# Patient Record
Sex: Female | Born: 1965
Health system: Southern US, Community
[De-identification: ages and names within clinical notes are randomized; demographics above are authoritative.]

## PROBLEM LIST (undated history)

## (undated) DIAGNOSIS — D509 Iron deficiency anemia, unspecified: Secondary | ICD-10-CM

## (undated) DIAGNOSIS — R87619 Unspecified abnormal cytological findings in specimens from cervix uteri: Secondary | ICD-10-CM

## (undated) DIAGNOSIS — N891 Moderate vaginal dysplasia: Secondary | ICD-10-CM

## (undated) DIAGNOSIS — N870 Mild cervical dysplasia: Secondary | ICD-10-CM

## (undated) DIAGNOSIS — K922 Gastrointestinal hemorrhage, unspecified: Secondary | ICD-10-CM

## (undated) DIAGNOSIS — K219 Gastro-esophageal reflux disease without esophagitis: Secondary | ICD-10-CM

## (undated) DIAGNOSIS — E785 Hyperlipidemia, unspecified: Secondary | ICD-10-CM

## (undated) DIAGNOSIS — D649 Anemia, unspecified: Secondary | ICD-10-CM

## (undated) DIAGNOSIS — F32A Depression, unspecified: Secondary | ICD-10-CM

## (undated) DIAGNOSIS — E119 Type 2 diabetes mellitus without complications: Secondary | ICD-10-CM

## (undated) DIAGNOSIS — L309 Dermatitis, unspecified: Secondary | ICD-10-CM

## (undated) DIAGNOSIS — L509 Urticaria, unspecified: Secondary | ICD-10-CM

## (undated) DIAGNOSIS — Z973 Presence of spectacles and contact lenses: Secondary | ICD-10-CM

## (undated) DIAGNOSIS — F329 Major depressive disorder, single episode, unspecified: Secondary | ICD-10-CM

## (undated) DIAGNOSIS — I10 Essential (primary) hypertension: Secondary | ICD-10-CM

## (undated) HISTORY — DX: Depression, unspecified: F32.A

## (undated) HISTORY — PX: COLONOSCOPY WITH ESOPHAGOGASTRODUODENOSCOPY (EGD): SHX5779

## (undated) HISTORY — DX: Essential (primary) hypertension: I10

## (undated) HISTORY — DX: Gastrointestinal hemorrhage, unspecified: K92.2

## (undated) HISTORY — DX: Hyperlipidemia, unspecified: E78.5

## (undated) HISTORY — DX: Dermatitis, unspecified: L30.9

## (undated) HISTORY — DX: Urticaria, unspecified: L50.9

## (undated) HISTORY — DX: Anemia, unspecified: D64.9

## (undated) HISTORY — DX: Major depressive disorder, single episode, unspecified: F32.9

## (undated) HISTORY — DX: Gastro-esophageal reflux disease without esophagitis: K21.9

## (undated) HISTORY — DX: Unspecified abnormal cytological findings in specimens from cervix uteri: R87.619

---

## 2004-02-02 DIAGNOSIS — Z87410 Personal history of cervical dysplasia: Secondary | ICD-10-CM

## 2004-02-02 HISTORY — PX: LEEP: SHX91

## 2004-02-02 HISTORY — PX: COLPOSCOPY: SHX161

## 2004-02-02 HISTORY — DX: Personal history of cervical dysplasia: Z87.410

## 2013-07-18 ENCOUNTER — Encounter: Payer: Self-pay | Admitting: *Deleted

## 2013-08-02 ENCOUNTER — Encounter: Payer: Self-pay | Admitting: Family Medicine

## 2013-08-02 ENCOUNTER — Ambulatory Visit (INDEPENDENT_AMBULATORY_CARE_PROVIDER_SITE_OTHER): Payer: 59 | Admitting: Family Medicine

## 2013-08-02 VITALS — BP 120/78 | HR 78 | Temp 98.9°F | Ht 62.25 in | Wt 141.5 lb

## 2013-08-02 DIAGNOSIS — F32A Depression, unspecified: Secondary | ICD-10-CM

## 2013-08-02 DIAGNOSIS — E785 Hyperlipidemia, unspecified: Secondary | ICD-10-CM

## 2013-08-02 DIAGNOSIS — F341 Dysthymic disorder: Secondary | ICD-10-CM

## 2013-08-02 DIAGNOSIS — Z7689 Persons encountering health services in other specified circumstances: Secondary | ICD-10-CM

## 2013-08-02 DIAGNOSIS — I10 Essential (primary) hypertension: Secondary | ICD-10-CM

## 2013-08-02 DIAGNOSIS — F419 Anxiety disorder, unspecified: Secondary | ICD-10-CM

## 2013-08-02 DIAGNOSIS — F329 Major depressive disorder, single episode, unspecified: Secondary | ICD-10-CM

## 2013-08-02 DIAGNOSIS — K219 Gastro-esophageal reflux disease without esophagitis: Secondary | ICD-10-CM

## 2013-08-02 MED ORDER — FLUOXETINE HCL 20 MG PO TABS: 20.0000 mg | ORAL_TABLET | Freq: Every day | ORAL | Status: DC

## 2013-08-02 NOTE — Progress Notes (Signed)
Pre visit review using our clinic review tool, if applicable. No additional management support is needed unless otherwise documented below in the visit note. 

## 2013-08-02 NOTE — Patient Instructions (Addendum)
-  check on last tdap and let us know at your physical  -we can discuss colon cancer screening further at your physical  Can try a taper off of the prozac: -try 54m daily for a few months, or can alternate 220mwith 40 mg if needed  We recommend the following healthy lifestyle measures: - eat a healthy diet consisting of lots of vegetables, fruits, beans, nuts, seeds, healthy meats such as white chicken and fish and whole grains.  - avoid fried foods, fast food, processed foods, sodas, red meet and other fattening foods.  - get a least 150 minutes of aerobic exercise per week.   Follow up as needed for the taper, and 4-6 months for follow up and labs

## 2013-08-02 NOTE — Progress Notes (Signed)
No chief complaint on file.   HPI:  Rachael Hodge is here to establish care. Moved to [place] about 2 months ago. Last PCP and physical: May 2015 - with pap and normal, [facility] nov 2014  Has the following chronic problems and concerns today:  There are no active problems to display for this patient.  HTN/HLD: -on norvasc -CV exercise 2-3 days per week, trying to eat healthy -Denies: CP, SOB, palpitations, swelling -had swelling in legs when took lisinopril -on asa daily -PGF died of MI at age 54, father had MI in early 36s -mild dyslipidemia, reviewed labs done 09/2012  Depression and GAD: -? Bipolar disorder -on prozac and doing great for a long time and whenever she tries to stop she does not do well, has not tried to come off in a long time -used to see a counselor, but doing great now -is interested in decreasing dose -denies SI, thoughts of self harm, no hx of hospitalization  GERD: -doing great on PPI -reports gets bad heartburn if stops PPI -denies: hx of stricture, esophagitis, swallowing issues  ROS: See pertinent positives and negatives per HPI.  Past Medical History  Diagnosis Date  . Depression   . GERD (gastroesophageal reflux disease)   . Hypertension     Family History  Problem Relation Age of Onset  . Heart disease Father   . Hyperlipidemia Father   . Hypertension Father   . Heart disease Paternal Grandfather     History   Social History  . Marital Status: Divorced    Spouse Name: N/A    Number of Children: N/A  . Years of Education: N/A   Social History Main Topics  . Smoking status: Never Smoker   . Smokeless tobacco: None  . Alcohol Use: Yes     Comment: occ 1 drink  . Drug Use: None  . Sexual Activity: None   Other Topics Concern  . 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      Spiritual Beliefs: Christian      Lifestyle:  exercising, trying to eat well             Current outpatient prescriptions:amLODipine (NORVASC) 5 MG tablet, Take 5 mg by mouth daily., Disp: , Rfl: ;  aspirin 81 MG tablet, Take 81 mg by mouth daily., Disp: , Rfl: ;  Fish Oil OIL, daily., Disp: , Rfl: ;  Multiple Vitamin (MULTIVITAMIN) capsule, Take 1 capsule by mouth daily., Disp: , Rfl: ;  pantoprazole (PROTONIX) 40 MG tablet, Take 40 mg by mouth daily., Disp: , Rfl:  FLUoxetine (PROZAC) 20 MG tablet, Take 1 tablet (20 mg total) by mouth daily., Disp: 90 tablet, Rfl: 3  EXAM:  Filed Vitals:   08/02/13 0933  BP: 120/78  Pulse: 78  Temp: 98.9 F (37.2 C)    Body mass index is 25.68 kg/(m^2).  GENERAL: vitals reviewed and listed above, alert, oriented, appears well hydrated and in no acute distress  HEENT: atraumatic, conjunttiva clear, no obvious abnormalities on inspection of external nose and ears  NECK: no obvious masses on inspection  LUNGS: clear to auscultation bilaterally, no wheezes, rales or rhonchi, good air movement  CV: HRRR, no peripheral edema  MS: moves all extremities without noticeable abnormality  PSYCH: pleasant and cooperative, no obvious depression or anxiety  ASSESSMENT AND PLAN:  Discussed the following assessment and plan:  Encounter  to establish care  Anxiety and depression - Plan: FLUoxetine (PROZAC) 20 MG tablet  Essential hypertension, benign  Hyperlipemia  Gastroesophageal reflux disease, esophagitis presence not specified  -We reviewed the PMH, PSH, FH, SH, Meds and Allergies. -We provided refills for any medications we will prescribe as needed. -We addressed current concerns per orders and patient instructions. -We have asked for records for pertinent exams, studies, vaccines and notes from previous providers. -We have advised patient to follow up per instructions below.   -Patient advised to return or notify a doctor immediately if symptoms worsen or persist or new concerns  arise.  Patient Instructions  -check on last tdap and let us know at your physical  -we can discuss colon cancer screening further at your physical  Can try a taper off of the prozac: -try 20mg  daily for a few months, or can alternate 20mg  with 40 mg if needed  We recommend the following healthy lifestyle measures: - eat a healthy diet consisting of lots of vegetables, fruits, beans, nuts, seeds, healthy meats such as white chicken and fish and whole grains.  - avoid fried foods, fast food, processed foods, sodas, red meet and other fattening foods.  - get a least 150 minutes of aerobic exercise per week.   Follow up as needed for the taper, and 4-6 months for follow up and labs       Mileigh Tilley, [place]

## 2013-08-07 ENCOUNTER — Other Ambulatory Visit: Payer: Self-pay

## 2013-08-07 DIAGNOSIS — Z1231 Encounter for screening mammogram for malignant neoplasm of breast: Secondary | ICD-10-CM

## 2013-08-16 ENCOUNTER — Ambulatory Visit
Admission: RE | Admit: 2013-08-16 | Discharge: 2013-08-16 | Disposition: A | Discharge status: Home / Self Care | Payer: 59 | Source: Ambulatory Visit

## 2013-08-16 DIAGNOSIS — Z1231 Encounter for screening mammogram for malignant neoplasm of breast: Secondary | ICD-10-CM

## 2013-08-28 ENCOUNTER — Telehealth: Payer: Self-pay | Admitting: Family Medicine

## 2013-08-28 ENCOUNTER — Other Ambulatory Visit: Payer: Self-pay | Admitting: Family Medicine

## 2013-08-28 MED ORDER — AMLODIPINE BESYLATE 5 MG PO TABS: 5.0000 mg | ORAL_TABLET | Freq: Every day | ORAL | Status: DC

## 2013-08-28 MED ORDER — PANTOPRAZOLE SODIUM 40 MG PO TBEC: 40.0000 mg | DELAYED_RELEASE_TABLET | Freq: Every day | ORAL | Status: DC

## 2013-08-28 NOTE — Progress Notes (Signed)
Sent!

## 2013-08-28 NOTE — Telephone Encounter (Signed)
Pt needs new rxs sent to [facility] outpt pharm amlodipine 5 mg #90 and pantoprazole 40 mg #90 w/refills

## 2013-10-11 ENCOUNTER — Ambulatory Visit (INDEPENDENT_AMBULATORY_CARE_PROVIDER_SITE_OTHER): Payer: 59 | Admitting: Family Medicine

## 2013-10-11 ENCOUNTER — Encounter: Payer: Self-pay | Admitting: Family Medicine

## 2013-10-11 VITALS — BP 100/64 | HR 72 | Temp 98.7°F | Ht 62.25 in | Wt 138.0 lb

## 2013-10-11 DIAGNOSIS — L259 Unspecified contact dermatitis, unspecified cause: Secondary | ICD-10-CM

## 2013-10-11 DIAGNOSIS — L309 Dermatitis, unspecified: Secondary | ICD-10-CM

## 2013-10-11 NOTE — Progress Notes (Signed)
Pre visit review using our clinic review tool, if applicable. No additional management support is needed unless otherwise documented below in the visit note. 

## 2013-10-11 NOTE — Patient Instructions (Signed)
-  lamisil (terbinafine)  twice daily  -follow up in 4 weeks

## 2013-10-11 NOTE — Progress Notes (Signed)
No chief complaint on file.   HPI:  Acute visit for:  1) Rash: -on R arm and leg -itchy, for last week, lesions R arm and R leg -denies fevers, malaise, pain  ROS: See pertinent positives and negatives per HPI.  Past Medical History  Diagnosis Date  . Depression   . GERD (gastroesophageal reflux disease)   . Hypertension     No past surgical history on file.  Family History  Problem Relation Age of Onset  . Heart disease Father   . Hyperlipidemia Father   . Hypertension Father   . Heart disease Paternal Grandfather     History   Social History  . Marital Status: Divorced    Spouse Name: N/A    Number of Children: N/A  . Years of Education: N/A   Social History Main Topics  . Smoking status: Never Smoker   . Smokeless tobacco: None  . Alcohol Use: Yes     Comment: occ 1 drink  . Drug Use: None  . Sexual Activity: None   Other Topics Concern  . 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      Spiritual Beliefs: Christian      Lifestyle: exercising, trying to eat well             Current outpatient prescriptions:amLODipine (NORVASC) 5 MG tablet, Take 1 tablet (5 mg total) by mouth daily., Disp: 90 tablet, Rfl: 3;  aspirin 81 MG tablet, Take 81 mg by mouth daily., Disp: , Rfl: ;  Fish Oil OIL, daily., Disp: , Rfl: ;  FLUoxetine (PROZAC) 20 MG tablet, Take 1 tablet (20 mg total) by mouth daily., Disp: 90 tablet, Rfl: 3;  Multiple Vitamin (MULTIVITAMIN) capsule, Take 1 capsule by mouth daily., Disp: , Rfl:  pantoprazole (PROTONIX) 40 MG tablet, Take 1 tablet (40 mg total) by mouth daily., Disp: 90 tablet, Rfl: 3  EXAM:  Filed Vitals:   10/11/13 0809  BP: 100/64  Pulse: 72  Temp: 98.7 F (37.1 C)    Body mass index is 25.04 kg/(m^2).  GENERAL: vitals reviewed and listed above, alert, oriented, appears well hydrated and in no acute distress  HEENT:  atraumatic, conjunttiva clear, no obvious abnormalities on inspection of external nose and ears  NECK: no obvious masses on inspection  LUNGS: clear to auscultation bilaterally, no wheezes, rales or rhonchi, good air movement  CV: HRRR, no peripheral edema  SKIN: anular lesion on the R arm and one on the R leg with erythematous scaly border with central clearing  MS: moves all extremities without noticeable abnormality  PSYCH: pleasant and cooperative, no obvious depression or anxiety  ASSESSMENT AND PLAN:  Discussed the following assessment and plan:  Dermatitis  -most likely tinea corporis or granuloma anulare - discussed options for dx/management -she opted for trial of topical antifungal and follow up in 4 weeks -Patient advised to return or notify a doctor immediately if symptoms worsen or persist or new concerns arise.  Patient Instructions  -lamisil (terbinafine)  twice daily  -follow up in 4 weeks     Nare Gaspari R.

## 2013-10-22 ENCOUNTER — Telehealth: Payer: Self-pay | Admitting: Family Medicine

## 2013-10-22 DIAGNOSIS — F32A Depression, unspecified: Secondary | ICD-10-CM

## 2013-10-22 DIAGNOSIS — F329 Major depressive disorder, single episode, unspecified: Secondary | ICD-10-CM

## 2013-10-22 DIAGNOSIS — F419 Anxiety disorder, unspecified: Principal | ICD-10-CM

## 2013-10-22 MED ORDER — FLUOXETINE HCL 20 MG PO TABS: 40.0000 mg | ORAL_TABLET | Freq: Every day | ORAL | Status: DC

## 2013-10-22 MED ORDER — FLUOXETINE HCL 40 MG PO CAPS: ORAL_CAPSULE | ORAL | Status: DC

## 2013-10-22 NOTE — Telephone Encounter (Signed)
I am not sure I understand this message. I think she was on 20 mg and stable? If refill sent incorrectly please correct. If something has changed and needs change in medication advise office visit.

## 2013-10-22 NOTE — Telephone Encounter (Signed)
[place] rx for 40 mg daily (2 tabs daily)

## 2013-10-22 NOTE — Telephone Encounter (Signed)
I called the pt and she stated she forgot to mention this to Dr Maudie Mercury when she was here last week but she only used the mg for a little while and felt "crummy" so she started taking the old Rx she had for 23m and she feels better.  Message forwarded to Dr KMaudie Mercury

## 2013-10-22 NOTE — Telephone Encounter (Signed)
Pt states her previous dosage ofFLUoxetine (PROZAC) 20 MG tablet was working better for her and would to go back to 5m please.

## 2013-10-22 NOTE — Telephone Encounter (Signed)
Rx done and the pt is aware the Rx was sent to her pharmacy.

## 2013-10-24 ENCOUNTER — Encounter: Payer: Self-pay | Admitting: Dietician

## 2013-10-24 ENCOUNTER — Encounter: Payer: 59 | Attending: Family Medicine | Admitting: Dietician

## 2013-10-24 VITALS — Ht 62.75 in | Wt 141.0 lb

## 2013-10-24 DIAGNOSIS — Z713 Dietary counseling and surveillance: Secondary | ICD-10-CM | POA: Diagnosis not present

## 2013-10-24 DIAGNOSIS — R635 Abnormal weight gain: Secondary | ICD-10-CM | POA: Insufficient documentation

## 2013-10-24 NOTE — Progress Notes (Signed)
Medical Nutrition Therapy:  Appt start time: 0730 end time:  0830.  Assessment:  Primary concerns today: Rachael Hodge is here today since she's had a lot changes in the past year and is gaining weight. She is going through menopause since last November. Has gained weight around the middle (about 15-20 in the past year). Has not had a weight problem before in her life. Has been doing her elliptical machine for the last few weeks and planning to start biking soon. Has followed [facility] [place] before and it is not working now. Thinks that she needs more accountability and she is craving things she used to not crave like sugar.   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 Moved from [place] in April. Works about 40 hours per week. Lives with her boyfriend. States that they both do the food shopping and her boyfriend cooks. During the week they don't cook but will "grab"   food. Skips about 5 meals per week (usually dinner) since she snacks "a lot". Has about 1 restaurant meal per week though eats cafeteria each day.   Weight loss goal is 130 lbs. Weighed 125 lbs most of adult life.  Preferred Learning Style:   No preference indicated   Learning Readiness:   Ready  MEDICATIONS: see list   DIETARY INTAKE:  Usual eating pattern includes 2-3 meals and 2 snacks per day.  Everyday foods include cheese  Avoided foods include: fish, beef, a lot of meats, spicy food, a lot of vegetables (asparagus, pepper, onion)  24-hr recall:  B ( AM): yogurt, eggs, or oatmeal with coffee with half and half and truvia Snk ( AM):cheese stick or protein bar (maybe Premier)  L ( PM): cafeteria salad - sometimes taco salad, vegetables with light salad dressing and chicken Snk ( PM): cheese, cookies, crackers D ( PM): doesn't prep - could be cheese and crackers, yogurt, leftovers (burritos, hamburgers on weekends) Snk ( PM): not usually Beverages: coffee and water, drinks a glass of  wine on Friday or Saturday  Usual physical activity: 3 x week for about 36 minutes (just started a few weeks ago), 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   Nutritional Diagnosis:  [place]-3.4 Unintentional weight gain As related to menopause.  As evidenced by 15-20 lbs weight gain in past year.    Intervention:  Nutrition counseling provided. Plan: Think about adding resistance training to your workout (trainer or classes) to help build muscle. Try not to use electronic devices in the hour before you go to bed. For meals and snacks, have protein with carbohydrates (cheese, yogurt, eggs, nuts, peanut butter) and portion out snacks ahead of time. [place] Protein bars for snacks. Eat a snack in the afternoon so you are not so hungry after work. Have prepared fruit and vegetables (prewashed and cut up) or frozen vegetables that you can microwave.  When you go out to eat, choose one starch to have.  Eat dinner meal at the table, on a plate, with no distractions.  Think about writing down what you eat.   Teaching Method Utilized:  Visual Auditory Hands on  Handouts given during visit include:  MyPlate Handout  15 g CHO snacks  Barriers to learning/adherence to lifestyle change: none  Demonstrated degree of understanding via:  Teach Back   Monitoring/Evaluation:  Dietary  intake, exercise, and body weight in 2 month(s).

## 2013-10-24 NOTE — Patient Instructions (Signed)
Think about adding resistance training to your workout (trainer or classes) to help build muscle. Try not to use electronic devices in the hour before you go to bed. For meals and snacks, have protein with carbohydrates (cheese, yogurt, eggs, nuts, peanut butter) and portion out snacks ahead of time. [place] Protein bars for snacks. Eat a snack in the afternoon so you are not so hungry after work. Have prepared fruit and vegetables (prewashed and cut up) or frozen vegetables that you can microwave.  When you go out to eat, choose one starch to have.  Eat dinner meal at the table, on a plate, with no distractions.  Think about writing down what you eat.

## 2013-12-03 ENCOUNTER — Ambulatory Visit: Payer: 59 | Admitting: Family Medicine

## 2013-12-18 ENCOUNTER — Ambulatory Visit: Payer: 59 | Admitting: Family Medicine

## 2013-12-26 ENCOUNTER — Ambulatory Visit: Payer: 59 | Admitting: Dietician

## 2014-01-05 ENCOUNTER — Encounter: Payer: 59 | Attending: Family Medicine | Admitting: Dietician

## 2014-01-05 DIAGNOSIS — Z713 Dietary counseling and surveillance: Secondary | ICD-10-CM | POA: Diagnosis present

## 2014-01-05 NOTE — Progress Notes (Signed)
Patient was seen on 01/05/2014 for the Weight Loss Class at [facility]. The following learning objectives were met by the patient during this class:   Describe healthy choices in each food group  Describe portion size of foods  Use plate method for meal planning  Demonstrate how to read Nutrition Facts food label  Set realistic goals for weight loss, diet changes, and physical activity.   Goals:  1. Make healthy food choices in each food group.  2. Reduce portion size of foods.  3. Increase fruit and vegetable intake.  4. Use plate method for meal planning.  5. Increase physical activity.    Handouts given:   1. Nutrition Strategies for Weight Loss   2. Meal plan/portion card   3. MyPlate Planner   4. Weight Management Recipe Resources   5. Bake, Broil, Grill   

## 2014-01-14 ENCOUNTER — Ambulatory Visit (INDEPENDENT_AMBULATORY_CARE_PROVIDER_SITE_OTHER): Payer: 59 | Admitting: Family Medicine

## 2014-01-14 ENCOUNTER — Encounter: Payer: Self-pay | Admitting: Family Medicine

## 2014-01-14 VITALS — BP 122/82 | HR 85 | Temp 98.4°F | Ht 62.75 in | Wt 148.8 lb

## 2014-01-14 DIAGNOSIS — F32A Depression, unspecified: Secondary | ICD-10-CM

## 2014-01-14 DIAGNOSIS — K219 Gastro-esophageal reflux disease without esophagitis: Secondary | ICD-10-CM

## 2014-01-14 DIAGNOSIS — F329 Major depressive disorder, single episode, unspecified: Secondary | ICD-10-CM

## 2014-01-14 DIAGNOSIS — Z1211 Encounter for screening for malignant neoplasm of colon: Secondary | ICD-10-CM

## 2014-01-14 DIAGNOSIS — I10 Essential (primary) hypertension: Secondary | ICD-10-CM

## 2014-01-14 LAB — BASIC METABOLIC PANEL
BUN: 15 mg/dL (ref 6–23)
CALCIUM: 9.4 mg/dL (ref 8.4–10.5)
CO2: 26 mEq/L (ref 19–32)
CREATININE: 0.9 mg/dL (ref 0.4–1.2)
Chloride: 105 mEq/L (ref 96–112)
GFR: 73.95 mL/min (ref >60.00)
Glucose, Bld: 87 mg/dL (ref 70–99)
Potassium: 4.3 mEq/L (ref 3.5–5.1)
Sodium: 138 mEq/L (ref 135–145)

## 2014-01-14 LAB — LIPID PANEL
CHOLESTEROL: 262 mg/dL — AB (ref 0–200)
HDL: 87.5 mg/dL (ref >39.00)
LDL Cholesterol: 160 mg/dL — ABNORMAL HIGH (ref 0–99)
NonHDL: 174.5
TRIGLYCERIDES: 73 mg/dL (ref 0.0–149.0)
Total CHOL/HDL Ratio: 3
VLDL: 14.6 mg/dL (ref 0.0–40.0)

## 2014-01-14 LAB — HEMOGLOBIN A1C: HEMOGLOBIN A1C: 6.5 % (ref 4.6–6.5)

## 2014-01-14 NOTE — Progress Notes (Signed)
HPI:  Follow up:  Depression: -on prozax 40mg  daily -reports: doing much better, feels great Depression Symptoms: Sleep disorder: no Interest deficit/anhedonia: no Guilt (worthlessness, hopelessness, regret): no Energy deficit: no Concentration deficit: no Appetite disorder: no Psychomotor retardation or agitation: no Suicidality: no  GERD: -meds: protonix 40mg  -denies hx of stricture or hiatal hernia to her knowledge -symptoms return when stops PPI -denies: dysphagia, worsening reflux, nausea, weight loss  HTN: -meds: amlodipine -denies: CP, SOB, DOE, swelling -working on exercise, got a fit bit  HM: -colon ca screening - declined colonoscopy now, opted for FOBT cards -flu vaccine - done  ROS: See pertinent positives and negatives per HPI.  Past Medical History  Diagnosis Date  . Depression   . GERD (gastroesophageal reflux disease)   . Hypertension   . Hyperlipidemia     No past surgical history on file.  Family History  Problem Relation Age of Onset  . Heart disease Father   . Hyperlipidemia Father   . Hypertension Father   . Heart disease Paternal Grandfather     History   Social History  . Marital Status: Divorced    Spouse Name: N/A    Number of Children: N/A  . Years of Education: N/A   Social History Main Topics  . Smoking status: Never Smoker   . Smokeless tobacco: None  . Alcohol Use: Yes     Comment: occ 1 drink  . Drug Use: None  . Sexual Activity: None   Other Topics Concern  . 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      Spiritual Beliefs: Christian      Lifestyle: exercising, trying to eat well             Current outpatient prescriptions: amLODipine (NORVASC) 5 MG tablet, Take 1 tablet (5 mg total) by mouth daily., Disp: 90 tablet, Rfl: 3;  aspirin 81 MG tablet, Take 81 mg by mouth daily., Disp: , Rfl: ;  Fish Oil OIL,  daily., Disp: , Rfl: ;  FLUoxetine (PROZAC) 40 MG capsule, Take 1 by mouth daily, Disp: 30 capsule, Rfl: 0;  Multiple Vitamin (MULTIVITAMIN) capsule, Take 1 capsule by mouth daily., Disp: , Rfl:  pantoprazole (PROTONIX) 40 MG tablet, Take 1 tablet (40 mg total) by mouth daily., Disp: 90 tablet, Rfl: 3  EXAM:  Filed Vitals:   01/14/14 0859  BP: 122/82  Pulse: 85  Temp: 98.4 F (36.9 C)    Body mass index is 26.56 kg/(m^2).  GENERAL: vitals reviewed and listed above, alert, oriented, appears well hydrated and in no acute distress  HEENT: atraumatic, conjunttiva clear, no obvious abnormalities on inspection of external nose and ears  NECK: no obvious masses on inspection  LUNGS: clear to auscultation bilaterally, no wheezes, rales or rhonchi, good air movement  CV: HRRR, no peripheral edema  MS: moves all extremities without noticeable abnormality  PSYCH: pleasant and cooperative, no obvious depression or anxiety  ASSESSMENT AND PLAN:  Discussed the following assessment and plan:  Essential hypertension - Plan: Basic metabolic panel, Hemoglobin A1c, Lipid Panel  Gastroesophageal reflux disease, esophagitis presence not specified  Depression  Colon cancer screening  -advised of colon cancer screening and flu vaccine preventive care - she wants to do stool cards, did flu vaccine -advised healthy lifestyle for management of HTN and GERD -advised counseling and regular exercise for emotional health -follow up 4  months  -Patient advised to return or notify a doctor immediately if symptoms worsen or persist or new concerns arise.  Patient Instructions  BEFORE YOU LEAVE: -labs -schedule follow up in 4 months  -We have ordered labs or studies at this visit. It can take up to 1-2 weeks for results and processing. We will contact you with instructions IF your results are abnormal. Normal results will be released to your [facility]. If you have not heard from [place] or can not find  your results in [facility] in 2 weeks please contact our office.  -PLEASE SIGN UP FOR MYCHART TODAY   We recommend the following healthy lifestyle measures: - eat a healthy diet consisting of lots of vegetables, fruits, beans, nuts, seeds, healthy meats such as white chicken and fish and whole grains.  - avoid fried foods, fast food, processed foods, sodas, red meet and other fattening foods.  - get a least 150 minutes of aerobic exercise per week.         Colin Benton R.

## 2014-01-14 NOTE — Patient Instructions (Signed)
BEFORE YOU LEAVE: -labs -schedule follow up in 4 months  -We have ordered labs or studies at this visit. It can take up to 1-2 weeks for results and processing. We will contact you with instructions IF your results are abnormal. Normal results will be released to your [facility]. If you have not heard from [place] or can not find your results in [facility] in 2 weeks please contact our office.  -PLEASE SIGN UP FOR MYCHART TODAY   We recommend the following healthy lifestyle measures: - eat a healthy diet consisting of lots of vegetables, fruits, beans, nuts, seeds, healthy meats such as white chicken and fish and whole grains.  - avoid fried foods, fast food, processed foods, sodas, red meet and other fattening foods.  - get a least 150 minutes of aerobic exercise per week.

## 2014-01-14 NOTE — Progress Notes (Signed)
Pre visit review using our clinic review tool, if applicable. No additional management support is needed unless otherwise documented below in the visit note. 

## 2014-01-15 ENCOUNTER — Telehealth: Payer: Self-pay | Admitting: Family Medicine

## 2014-01-15 NOTE — Telephone Encounter (Signed)
emmi emailed °

## 2014-02-19 ENCOUNTER — Other Ambulatory Visit: Payer: Self-pay | Admitting: Family Medicine

## 2014-02-19 MED ORDER — FLUOXETINE HCL 40 MG PO CAPS: ORAL_CAPSULE | ORAL | Status: DC

## 2014-02-19 NOTE — Telephone Encounter (Signed)
Rx done. 

## 2014-04-18 ENCOUNTER — Ambulatory Visit: Payer: 59 | Admitting: Family Medicine

## 2014-04-20 ENCOUNTER — Encounter (HOSPITAL_COMMUNITY): Payer: Self-pay | Admitting: *Deleted

## 2014-04-20 ENCOUNTER — Emergency Department (HOSPITAL_COMMUNITY)
Admission: EM | Admit: 2014-04-20 | Discharge: 2014-04-20 | Disposition: A | Discharge status: Home / Self Care | Payer: 59 | Source: Home / Self Care | Attending: Emergency Medicine | Admitting: Emergency Medicine

## 2014-04-20 DIAGNOSIS — J069 Acute upper respiratory infection, unspecified: Secondary | ICD-10-CM

## 2014-04-20 MED ORDER — IPRATROPIUM BROMIDE 0.06 % NA SOLN: 2.0000 | Freq: Four times a day (QID) | NASAL | Status: DC

## 2014-04-20 MED ORDER — BENZONATATE 100 MG PO CAPS: 100.0000 mg | ORAL_CAPSULE | Freq: Three times a day (TID) | ORAL | Status: DC | PRN

## 2014-04-20 NOTE — Discharge Instructions (Signed)
Expect improvement over the next 5-7 days. If not, please follow up with your doctor Upper Respiratory Infection, Adult An upper respiratory infection (URI) is also sometimes known as the common cold. The upper respiratory tract includes the nose, sinuses, throat, trachea, and bronchi. Bronchi are the airways leading to the lungs. Most people improve within 1 week, but symptoms can last up to 2 weeks. A residual cough may last even longer.  CAUSES Many different viruses can infect the tissues lining the upper respiratory tract. The tissues become irritated and inflamed and often become very moist. Mucus production is also common. A cold is contagious. You can easily spread the virus to others by oral contact. This includes kissing, sharing a glass, coughing, or sneezing. Touching your mouth or nose and then touching a surface, which is then touched by another person, can also spread the virus. SYMPTOMS  Symptoms typically develop 1 to 3 days after you come in contact with a cold virus. Symptoms vary from person to person. They may include:  Runny nose.  Sneezing.  Nasal congestion.  Sinus irritation.  Sore throat.  Loss of voice (laryngitis).  Cough.  Fatigue.  Muscle aches.  Loss of appetite.  Headache.  Low-grade fever. DIAGNOSIS  You might diagnose your own cold based on familiar symptoms, since most people get a cold 2 to 3 times a year. Your caregiver can confirm this based on your exam. Most importantly, your caregiver can check that your symptoms are not due to another disease such as strep throat, sinusitis, pneumonia, asthma, or epiglottitis. Blood tests, throat tests, and X-rays are not necessary to diagnose a common cold, but they may sometimes be helpful in excluding other more serious diseases. Your caregiver will decide if any further tests are required. RISKS AND COMPLICATIONS  You may be at risk for a more severe case of the common cold if you smoke cigarettes, have  chronic heart disease (such as heart failure) or lung disease (such as asthma), or if you have a weakened immune system. The very young and very old are also at risk for more serious infections. Bacterial sinusitis, middle ear infections, and bacterial pneumonia can complicate the common cold. The common cold can worsen asthma and chronic obstructive pulmonary disease (COPD). Sometimes, these complications can require emergency medical care and may be life-threatening. PREVENTION  The best way to protect against getting a cold is to practice good hygiene. Avoid oral or hand contact with people with cold symptoms. Wash your hands often if contact occurs. There is no clear evidence that vitamin C, vitamin E, echinacea, or exercise reduces the chance of developing a cold. However, it is always recommended to get plenty of rest and practice good nutrition. TREATMENT  Treatment is directed at relieving symptoms. There is no cure. Antibiotics are not effective, because the infection is caused by a virus, not by bacteria. Treatment may include:  Increased fluid intake. Sports drinks offer valuable electrolytes, sugars, and fluids.  Breathing heated mist or steam (vaporizer or shower).  Eating chicken soup or other clear broths, and maintaining good nutrition.  Getting plenty of rest.  Using gargles or lozenges for comfort.  Controlling fevers with ibuprofen or acetaminophen as directed by your caregiver.  Increasing usage of your inhaler if you have asthma. Zinc gel and zinc lozenges, taken in the first 24 hours of the common cold, can shorten the duration and lessen the severity of symptoms. Pain medicines may help with fever, muscle aches, and throat pain.  A variety of non-prescription medicines are available to treat congestion and runny nose. Your caregiver can make recommendations and may suggest nasal or lung inhalers for other symptoms.  HOME CARE INSTRUCTIONS   Only take over-the-counter or  prescription medicines for pain, discomfort, or fever as directed by your caregiver.  Use a warm mist humidifier or inhale steam from a shower to increase air moisture. This may keep secretions moist and make it easier to breathe.  Drink enough water and fluids to keep your urine clear or pale yellow.  Rest as needed.  Return to work when your temperature has returned to normal or as your caregiver advises. You may need to stay home longer to avoid infecting others. You can also use a face mask and careful hand washing to prevent spread of the virus. SEEK MEDICAL CARE IF:   After the first few days, you feel you are getting worse rather than better.  You need your caregiver's advice about medicines to control symptoms.  You develop chills, worsening shortness of breath, or brown or red sputum. These may be signs of pneumonia.  You develop yellow or brown nasal discharge or pain in the face, especially when you bend forward. These may be signs of sinusitis.  You develop a fever, swollen neck glands, pain with swallowing, or white areas in the back of your throat. These may be signs of strep throat. SEEK IMMEDIATE MEDICAL CARE IF:   You have a fever.  You develop severe or persistent headache, ear pain, sinus pain, or chest pain.  You develop wheezing, a prolonged cough, cough up blood, or have a change in your usual mucus (if you have chronic lung disease).  You develop sore muscles or a stiff neck. Document Released: 07/14/2000 Document Revised: 04/12/2011 Document Reviewed: 04/25/2013 [facility] Patient Information 2015 [place], [place]. This information is not intended to replace advice given to you by your health care provider. Make sure you discuss any questions you have with your health care provider.

## 2014-04-20 NOTE — ED Notes (Addendum)
C/o cough and congestion onset 3 days ago.  C/o sore throat.  Earache Wed and Demetrius Charity but is gone now.  Cough is prod of green sputum.  Had chills and fever the first couple of days.  States she has been taking Nyquil and Dayquil without relief.  States it has made her BP go up.

## 2014-04-20 NOTE — ED Provider Notes (Signed)
CSN: [id number]     Arrival date & time 04/20/14  1322 History   First MD Initiated Contact with Patient 04/20/14 1431     Chief Complaint  Patient presents with  . Cough   (Consider location/radiation/quality/duration/timing/severity/associated sxs/prior Treatment) Patient is a 49 y.o. female presenting with URI. The history is provided by the patient.  URI Presenting symptoms: congestion, cough and rhinorrhea   Presenting symptoms: no ear pain, no facial pain, no fatigue, no fever and no sore throat   Severity:  Mild Onset quality:  Gradual Duration:  6 days Timing:  Constant Progression:  Improving Chronicity:  New Associated symptoms: no headaches, no neck pain and no wheezing     Past Medical History  Diagnosis Date  . Depression   . GERD (gastroesophageal reflux disease)   . Hypertension   . Hyperlipidemia    History reviewed. No pertinent past surgical history. Family History  Problem Relation Age of Onset  . Heart disease Father   . Hyperlipidemia Father   . Hypertension Father   . Heart disease Paternal Grandfather    History  Substance Use Topics  . Smoking status: Never Smoker   . Smokeless tobacco: Not on file  . Alcohol Use: No   OB History    No data available     Review of Systems  Constitutional: Negative for fever and fatigue.  HENT: Positive for congestion and rhinorrhea. Negative for ear pain and sore throat.   Eyes: Negative.   Respiratory: Positive for cough. Negative for chest tightness, shortness of breath and wheezing.   Cardiovascular: Negative.   Gastrointestinal: Negative.   Musculoskeletal: Negative for back pain, neck pain and neck stiffness.  Skin: Negative.   Neurological: Negative for dizziness, weakness and headaches.    Allergies  Penicillins and Sulfa antibiotics  Home Medications   Prior to Admission medications   Medication Sig Start Date End Date Taking? Authorizing Provider  amLODipine (NORVASC) 5 MG tablet Take 1  tablet (5 mg total) by mouth daily. 08/28/13  Yes Lucretia Kern, DO  aspirin 81 MG tablet Take 81 mg by mouth daily.   Yes Historical Provider, MD  Fish Oil OIL daily.   Yes Historical Provider, MD  FLUoxetine (PROZAC) 40 MG capsule Take 1 by mouth daily 02/19/14  Yes Lucretia Kern, DO  Multiple Vitamin (MULTIVITAMIN) capsule Take 1 capsule by mouth daily.   Yes Historical Provider, MD  pantoprazole (PROTONIX) 40 MG tablet Take 1 tablet (40 mg total) by mouth daily. 08/28/13  Yes Lucretia Kern, DO  benzonatate (TESSALON) 100 MG capsule Take 1 capsule (100 mg total) by mouth 3 (three) times daily as needed for cough. 04/20/14   Audelia Hives Zema Lizardo, PA  ipratropium (ATROVENT) 0.06 % nasal spray Place 2 sprays into both nostrils 4 (four) times daily. For nasal congestion 04/20/14   Annett Gula H Eldred Sooy, PA   BP 158/75 mmHg  Pulse 72  Temp(Src) 97.8 F (36.6 C) (Oral)  SpO2 100%  LMP 12/02/2012 Physical Exam  Constitutional: She is oriented to person, place, and time. She appears well-developed and well-nourished. No distress.  HENT:  Head: Normocephalic and atraumatic.  Right Ear: Hearing, tympanic membrane, external ear and ear canal normal.  Left Ear: Hearing, tympanic membrane, external ear and ear canal normal.  Nose: Nose normal.  Mouth/Throat: Uvula is midline, oropharynx is clear and moist and mucous membranes are normal.  Eyes: Conjunctivae are normal. No scleral icterus.  Neck: Normal range of motion.  Neck supple.  Cardiovascular: Normal rate, regular rhythm and normal heart sounds.   Pulmonary/Chest: Effort normal and breath sounds normal.  Musculoskeletal: Normal range of motion.  Lymphadenopathy:    She has no cervical adenopathy.  Neurological: She is alert and oriented to person, place, and time.  Skin: Skin is warm and dry.  Psychiatric: She has a normal mood and affect. Her behavior is normal.  Nursing note and vitals reviewed.   ED Course  Procedures (including  critical care time) Labs Review Labs Reviewed - No data to display  Imaging Review No results found.   MDM   1. URI (upper respiratory infection)   fluids, rest, tylenol, atrovent nasal spray and tessalon as prescribed with PCP follow up. Advised that cough will likely take and additional 7-10 days to resolve.     Lutricia Feil, [place] 04/20/14 [phone]

## 2014-04-24 ENCOUNTER — Encounter: Payer: Self-pay | Admitting: Family Medicine

## 2014-04-24 ENCOUNTER — Ambulatory Visit (INDEPENDENT_AMBULATORY_CARE_PROVIDER_SITE_OTHER): Payer: 59 | Admitting: Family Medicine

## 2014-04-24 VITALS — BP 140/86 | HR 77 | Temp 98.3°F | Wt 147.0 lb

## 2014-04-24 DIAGNOSIS — J0110 Acute frontal sinusitis, unspecified: Secondary | ICD-10-CM | POA: Diagnosis not present

## 2014-04-24 MED ORDER — PREDNISONE 20 MG PO TABS: ORAL_TABLET | ORAL | Status: DC

## 2014-04-24 NOTE — Patient Instructions (Signed)
Sinusitis (likely bacterial given > 10 days and getting better then getting worse)  Your body can likely still clear this without antibiotics.   We will decrease inflammation with prednisone x 7 days.   If you are not noting improvement within about 4 days, I am willing to call in azithromycin for you (call or mychart message me)

## 2014-04-24 NOTE — Progress Notes (Signed)
  Garret Reddish, MD Phone: [phone]  Subjective:   Rachael Hodge is a 49 y.o. year old very pleasant female patient who presents with the following:  Cough/congestion/sinus pressure -First day of illness 12th of March. Cough with irritation in chest with cough, green sputum and nasal drainage.  Fever in first few days subjective. Took nyquil and dayquil in beginning and did not help and BP went up so did not feel well. Saw Dr. Bridgett Larsson at urgent [facility] to use atrovent and tessalon pearls. Feels best when taking advil. Has stopped dayquil and nyquil. Feels congested. Feels frontal sinus pressure. Called in sick last week and felt better on Friday and then Saturday started to worsen again.   ROS- no nausea/vomiting. No shortness of breath or chest pain.   Past Medical History- no asthma history, nonsmoker, hypertension  Medications- reviewed and updated Current Outpatient Prescriptions  Medication Sig Dispense Refill  . amLODipine (NORVASC) 5 MG tablet Take 1 tablet (5 mg total) by mouth daily. 90 tablet 3  . aspirin 81 MG tablet Take 81 mg by mouth daily.    . Fish Oil OIL daily.    [name] FLUoxetine (PROZAC) 40 MG capsule Take 1 by mouth daily 30 capsule 3  . Multiple Vitamin (MULTIVITAMIN) capsule Take 1 capsule by mouth daily.    . pantoprazole (PROTONIX) 40 MG tablet Take 1 tablet (40 mg total) by mouth daily. 90 tablet 3  . benzonatate (TESSALON) 100 MG capsule Take 1 capsule (100 mg total) by mouth 3 (three) times daily as needed for cough. (Patient not taking: Reported on 04/24/2014) 21 capsule 0  . ipratropium (ATROVENT) 0.06 % nasal spray Place 2 sprays into both nostrils 4 (four) times daily. For nasal congestion (Patient not taking: Reported on 04/24/2014) 15 mL 0   No current facility-administered medications for this visit.    Objective: BP 140/86 mmHg  Pulse 77  Temp(Src) 98.3 F (36.8 C)  Wt 147 lb (66.679 kg)  SpO2 96%  LMP 12/02/2012 Gen: NAD, resting  comfortably HEENT: naral turbinates erythematous and swollen with green drainage, oropharynx normal without pharyngeal exudate, TM normal bilaterally, Mucous membranes are moist. Tender frontal sinuses CV: RRR no murmurs rubs or gallops Lungs: CTAB no crackles, wheeze, rhonchi Abdomen: soft/nontender/nondistended/normal bowel sounds.  Ext: no edema Skin: warm, dry, no rash   Assessment/Plan:  Sinusitis (likely bacterial given > 10 days and getting better then getting worse)  Discussed body can still likely clear without antibiotics, advise rest and fluids.   Prednisone 7 days to support.   Willing to call in azithromycin from day 3-7 of prednisone if not improving.   Meds ordered this encounter  Medications  . predniSONE (DELTASONE) 20 MG tablet    Sig: Take 2 pills for 3 days, then 1 pill for 4 days    Dispense:  10 tablet    Refill:  0

## 2014-05-21 ENCOUNTER — Encounter: Payer: Self-pay | Admitting: Family Medicine

## 2014-05-28 ENCOUNTER — Ambulatory Visit (INDEPENDENT_AMBULATORY_CARE_PROVIDER_SITE_OTHER): Payer: 59 | Admitting: Family Medicine

## 2014-05-28 ENCOUNTER — Encounter: Payer: Self-pay | Admitting: Family Medicine

## 2014-05-28 VITALS — BP 118/80 | HR 74 | Temp 98.2°F | Ht 62.75 in | Wt 145.3 lb

## 2014-05-28 DIAGNOSIS — R739 Hyperglycemia, unspecified: Secondary | ICD-10-CM

## 2014-05-28 DIAGNOSIS — K219 Gastro-esophageal reflux disease without esophagitis: Secondary | ICD-10-CM

## 2014-05-28 DIAGNOSIS — F329 Major depressive disorder, single episode, unspecified: Secondary | ICD-10-CM | POA: Diagnosis not present

## 2014-05-28 DIAGNOSIS — I10 Essential (primary) hypertension: Secondary | ICD-10-CM

## 2014-05-28 DIAGNOSIS — E785 Hyperlipidemia, unspecified: Secondary | ICD-10-CM | POA: Diagnosis not present

## 2014-05-28 DIAGNOSIS — F32A Depression, unspecified: Secondary | ICD-10-CM

## 2014-05-28 LAB — LIPID PANEL
CHOLESTEROL: 225 mg/dL — AB (ref 0–200)
HDL: 76.2 mg/dL (ref >39.00)
LDL CALC: 131 mg/dL — AB (ref 0–99)
NonHDL: 148.8
TRIGLYCERIDES: 88 mg/dL (ref 0.0–149.0)
Total CHOL/HDL Ratio: 3
VLDL: 17.6 mg/dL (ref 0.0–40.0)

## 2014-05-28 LAB — HEMOGLOBIN A1C: Hgb A1c MFr Bld: 6.2 % (ref 4.6–6.5)

## 2014-05-28 NOTE — Progress Notes (Signed)
HPI:  Follow up:  Depression: -on prozac 80m daily -reports: doing much better, feels great -denies: worsening, SI  GERD: -meds: protonix 489m-denies hx of stricture or hiatal hernia to her knowledge -symptoms return when stops PPI -denies: dysphagia, worsening reflux, nausea, weight loss  HTN: -meds: amlodipine -denies: CP, SOB, DOE, swelling -working on exercise, got a fit bit  Prediabetes/HLD: -reports: she has been working on her diet - has been trying to limit sugar and bread; elliptical - but not a regular basis -denies: polyuria, polydipsia, statin intolerance  HM: -HIV - declined  ROS: See pertinent positives and negatives per HPI.  Past Medical History  Diagnosis Date  . Depression   . GERD (gastroesophageal reflux disease)   . Hypertension   . Hyperlipidemia     No past surgical history on file.  Family History  Problem Relation Age of Onset  . Heart disease Father   . Hyperlipidemia Father   . Hypertension Father   . Heart disease Paternal Grandfather     History   Social History  . Marital Status: Divorced    Spouse Name: N/A  . Number of Children: N/A  . Years of Education: N/A   Social History Main Topics  . Smoking status: Never Smoker   . Smokeless tobacco: Not on file  . Alcohol Use: No  . Drug Use: No  . Sexual Activity: Not on file   Other Topics Concern  . 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  amLODipine (NORVASC) 5 MG tablet, Take 1 tablet (5 mg total) by mouth daily., Disp: 90 tablet, Rfl: 3 .  aspirin 81 MG tablet, Take 81 mg by mouth daily., Disp: , Rfl:  .  benzonatate (TESSALON) 100 MG capsule, Take 1 capsule (100 mg total) by mouth 3 (three) times daily as needed for cough., Disp:  21 capsule, Rfl: 0 .  Fish Oil OIL, daily., Disp: , Rfl:  .  FLUoxetine (PROZAC) 40 MG capsule, Take 1 by mouth daily, Disp: 30 capsule, Rfl: 3 .  ipratropium (ATROVENT) 0.06 % nasal spray, Place 2 sprays into both nostrils 4 (four) times daily. For nasal congestion, Disp: 15 mL, Rfl: 0 .  Multiple Vitamin (MULTIVITAMIN) capsule, Take 1 capsule by mouth daily., Disp: , Rfl:  .  pantoprazole (PROTONIX) 40 MG tablet, Take 1 tablet (40 mg total) by mouth daily., Disp: 90 tablet, Rfl: 3  EXAM:  Filed Vitals:   05/28/14 0757  BP: 118/80  Pulse: 74  Temp: 98.2 F (36.8 C)    Body mass index is 25.94 kg/(m^2).  GENERAL: vitals reviewed and listed above, alert, oriented, appears well hydrated and in no acute distress  HEENT: atraumatic, conjunttiva clear, no obvious abnormalities on inspection of external nose and ears  NECK: no obvious masses on inspection  LUNGS: clear to auscultation bilaterally, no wheezes, rales or rhonchi, good air movement  CV: HRRR, no peripheral edema  MS: moves all extremities without noticeable abnormality  PSYCH: pleasant and cooperative, no obvious depression or anxiety  ASSESSMENT AND PLAN:  Discussed the following assessment and plan:  Hyperlipemia - Plan: Lipid Panel -discussed starting statin if still very elevated -lifestyle recs  Essential hypertension -stable  Hyperglycemia - Plan: Hemoglobin A1c -lifestyle recs  Depression -stable  Gastroesophageal reflux disease, esophagitis presence not specified  -Patient advised to return or notify a doctor immediately if symptoms worsen or persist or new concerns arise.  There are no Patient Instructions on file for this visit.   Colin Benton R.

## 2014-05-28 NOTE — Progress Notes (Signed)
Pre visit review using our clinic review tool, if applicable. No additional management support is needed unless otherwise documented below in the visit note. 

## 2014-06-18 ENCOUNTER — Telehealth: Payer: Self-pay | Admitting: Family Medicine

## 2014-06-18 MED ORDER — AMLODIPINE BESYLATE 5 MG PO TABS: 5.0000 mg | ORAL_TABLET | Freq: Every day | ORAL | Status: DC

## 2014-06-18 MED ORDER — FLUOXETINE HCL 40 MG PO CAPS: ORAL_CAPSULE | ORAL | Status: DC

## 2014-06-18 MED ORDER — PANTOPRAZOLE SODIUM 40 MG PO TBEC: 40.0000 mg | DELAYED_RELEASE_TABLET | Freq: Every day | ORAL | Status: DC

## 2014-06-18 NOTE — Telephone Encounter (Signed)
Rxs done. 

## 2014-06-18 NOTE — Telephone Encounter (Signed)
Pt request refill of the following: FLUoxetine (PROZAC) 40 MG capsule pantoprazole (PROTONIX) 40 MG tablet amLODipine (NORVASC) 5 MG tablet   Phamacy:  [place]

## 2014-07-02 ENCOUNTER — Encounter: Payer: Self-pay | Admitting: Family Medicine

## 2014-07-15 ENCOUNTER — Other Ambulatory Visit: Payer: Self-pay

## 2014-07-15 DIAGNOSIS — Z1231 Encounter for screening mammogram for malignant neoplasm of breast: Secondary | ICD-10-CM

## 2014-08-20 ENCOUNTER — Ambulatory Visit
Admission: RE | Admit: 2014-08-20 | Discharge: 2014-08-20 | Disposition: A | Discharge status: Home / Self Care | Payer: 59 | Source: Ambulatory Visit

## 2014-08-20 DIAGNOSIS — Z1231 Encounter for screening mammogram for malignant neoplasm of breast: Secondary | ICD-10-CM

## 2014-09-27 ENCOUNTER — Ambulatory Visit (INDEPENDENT_AMBULATORY_CARE_PROVIDER_SITE_OTHER): Payer: 59 | Admitting: Family Medicine

## 2014-09-27 ENCOUNTER — Encounter: Payer: Self-pay | Admitting: Family Medicine

## 2014-09-27 VITALS — BP 102/76 | HR 82 | Temp 98.2°F | Ht 63.25 in | Wt 143.1 lb

## 2014-09-27 DIAGNOSIS — Z23 Encounter for immunization: Secondary | ICD-10-CM | POA: Diagnosis not present

## 2014-09-27 DIAGNOSIS — F329 Major depressive disorder, single episode, unspecified: Secondary | ICD-10-CM | POA: Diagnosis not present

## 2014-09-27 DIAGNOSIS — I10 Essential (primary) hypertension: Secondary | ICD-10-CM | POA: Diagnosis not present

## 2014-09-27 DIAGNOSIS — R7303 Prediabetes: Secondary | ICD-10-CM

## 2014-09-27 DIAGNOSIS — R7309 Other abnormal glucose: Secondary | ICD-10-CM

## 2014-09-27 DIAGNOSIS — K219 Gastro-esophageal reflux disease without esophagitis: Secondary | ICD-10-CM | POA: Diagnosis not present

## 2014-09-27 DIAGNOSIS — E785 Hyperlipidemia, unspecified: Secondary | ICD-10-CM

## 2014-09-27 DIAGNOSIS — F32A Depression, unspecified: Secondary | ICD-10-CM

## 2014-09-27 LAB — BASIC METABOLIC PANEL
BUN: 11 mg/dL (ref 6–23)
CO2: 29 meq/L (ref 19–32)
Calcium: 9.7 mg/dL (ref 8.4–10.5)
Chloride: 102 mEq/L (ref 96–112)
Creatinine, Ser: 0.94 mg/dL (ref 0.40–1.20)
GFR: 66.55 mL/min (ref >60.00)
Glucose, Bld: 91 mg/dL (ref 70–99)
Potassium: 4.7 mEq/L (ref 3.5–5.1)
Sodium: 139 mEq/L (ref 135–145)

## 2014-09-27 LAB — LIPID PANEL
Cholesterol: 267 mg/dL — ABNORMAL HIGH (ref 0–200)
HDL: 78.1 mg/dL (ref >39.00)
LDL Cholesterol: 165 mg/dL — ABNORMAL HIGH (ref 0–99)
NonHDL: 189.09
Total CHOL/HDL Ratio: 3
Triglycerides: 122 mg/dL (ref 0.0–149.0)
VLDL: 24.4 mg/dL (ref 0.0–40.0)

## 2014-09-27 NOTE — Progress Notes (Signed)
HPI:  Follow up:  Depression: -on prozac 40mg  daily -reports: doing much better, feels great -denies: worsening, SI  GERD: -meds: protonix 40mg  -denies hx of stricture or hiatal hernia to her knowledge -symptoms return when stops PPI but she was heavier at the time -denies: dysphagia, worsening reflux, nausea, weight loss  HTN: -meds: amlodipine -denies: CP, SOB, DOE, swelling -working on exercise, got a fit bit  Prediabetes/HLD: -reports: she has been working on her diet - has been trying to limit sugar and bread -she feels like she is getting 2 hours of aerobic exercise per week -denies: polyuria, polydipsia, statin intolerance   ROS: See pertinent positives and negatives per HPI.  Past Medical History  Diagnosis Date  . Depression   . GERD (gastroesophageal reflux disease)   . Hypertension   . Hyperlipidemia     No past surgical history on file.  Family History  Problem Relation Age of Onset  . Heart disease Father   . Hyperlipidemia Father   . Hypertension Father   . Heart disease Paternal Grandfather     Social History   Social History  . Marital Status: Divorced    Spouse Name: N/A  . Number of Children: N/A  . Years of Education: N/A   Social History Main Topics  . Smoking status: Never Smoker   . Smokeless tobacco: None  . Alcohol Use: No  . Drug Use: No  . Sexual Activity: Not Asked   Other Topics Concern  . 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  amLODipine (NORVASC) 5 MG tablet, Take 1 tablet (5 mg total) by mouth daily., Disp: 90 tablet, Rfl: 1 .  aspirin 81 MG tablet, Take 81 mg by mouth daily., Disp: , Rfl:  .  Fish Oil OIL, daily., Disp: , Rfl:  .  FLUoxetine (PROZAC) 40 MG capsule, Take 1 by  mouth daily, Disp: 30 capsule, Rfl: 3 .  Multiple Vitamin (MULTIVITAMIN) capsule, Take 1 capsule by mouth daily., Disp: , Rfl:  .  pantoprazole (PROTONIX) 40 MG tablet, Take 1 tablet (40 mg total) by mouth daily., Disp: 90 tablet, Rfl: 1  EXAM:  Filed Vitals:   09/27/14 0759  BP: 102/76  Pulse: 82  Temp: 98.2 F (36.8 C)    Body mass index is 25.14 kg/(m^2).  GENERAL: vitals reviewed and listed above, alert, oriented, appears well hydrated and in no acute distress  HEENT: atraumatic, conjunttiva clear, no obvious abnormalities on inspection of external nose and ears  NECK: no obvious masses on inspection  LUNGS: clear to auscultation bilaterally, no wheezes, rales or rhonchi, good air movement  CV: HRRR, no peripheral edema  MS: moves all extremities without noticeable abnormality  PSYCH: pleasant and cooperative, no obvious depression or anxiety  ASSESSMENT AND PLAN:  Discussed the following assessment and plan:  Depression  Gastroesophageal reflux disease, esophagitis presence not specified  Essential hypertension - Plan: Basic metabolic panel  Prediabetes - Plan: Basic metabolic panel  Hyperlipemia - Plan: Lipid Panel  -Patient advised to return or notify a doctor immediately if symptoms worsen or persist or new concerns arise.  Patient Instructions  BEFORE YOU LEAVE: -labs -flu shot -follow up  in 4-6 months for physical exam  Please try to decrease protonix 20mg  for 1 week, then stop - if symptoms return try over the counter zantac (ranitidine 75-150mg ) daily  We recommend the following healthy lifestyle measures: - eat a healthy diet consisting of lots of vegetables, fruits, beans, nuts, seeds, healthy meats such as white chicken and fish and whole grains.  - avoid fried foods, fast food, processed foods, sodas, red meet and other fattening foods.  - get a least 150 minutes of aerobic exercise per week.       Colin Benton R.

## 2014-09-27 NOTE — Patient Instructions (Signed)
BEFORE YOU LEAVE: -labs -flu shot -follow up in 4-6 months for physical exam  Please try to decrease protonix 20mg  for 1 week, then stop - if symptoms return try over the counter zantac (ranitidine 75-150mg ) daily  We recommend the following healthy lifestyle measures: - eat a healthy diet consisting of lots of vegetables, fruits, beans, nuts, seeds, healthy meats such as white chicken and fish and whole grains.  - avoid fried foods, fast food, processed foods, sodas, red meet and other fattening foods.  - get a least 150 minutes of aerobic exercise per week.

## 2014-09-27 NOTE — Progress Notes (Signed)
Pre visit review using our clinic review tool, if applicable. No additional management support is needed unless otherwise documented below in the visit note. 

## 2014-10-01 ENCOUNTER — Other Ambulatory Visit: Payer: Self-pay | Admitting: Family Medicine

## 2014-11-28 ENCOUNTER — Other Ambulatory Visit: Payer: Self-pay | Admitting: Family Medicine

## 2015-03-05 MED FILL — FLUoxetine HCL 40 MG CAPS: 40 | 30 days supply | Qty: 30 | Fill #3

## 2015-03-31 ENCOUNTER — Telehealth: Payer: Self-pay | Admitting: Family Medicine

## 2015-03-31 ENCOUNTER — Encounter: Payer: Self-pay | Admitting: Family Medicine

## 2015-03-31 ENCOUNTER — Ambulatory Visit (INDEPENDENT_AMBULATORY_CARE_PROVIDER_SITE_OTHER): Payer: 59 | Admitting: Family Medicine

## 2015-03-31 VITALS — BP 112/78 | HR 79 | Temp 98.1°F | Ht 62.25 in | Wt 139.0 lb

## 2015-03-31 DIAGNOSIS — Z0000 Encounter for general adult medical examination without abnormal findings: Secondary | ICD-10-CM

## 2015-03-31 DIAGNOSIS — R739 Hyperglycemia, unspecified: Secondary | ICD-10-CM

## 2015-03-31 DIAGNOSIS — K219 Gastro-esophageal reflux disease without esophagitis: Secondary | ICD-10-CM

## 2015-03-31 DIAGNOSIS — E785 Hyperlipidemia, unspecified: Secondary | ICD-10-CM

## 2015-03-31 DIAGNOSIS — F3342 Major depressive disorder, recurrent, in full remission: Secondary | ICD-10-CM

## 2015-03-31 DIAGNOSIS — I10 Essential (primary) hypertension: Secondary | ICD-10-CM

## 2015-03-31 LAB — LIPID PANEL
CHOL/HDL RATIO: 3
CHOLESTEROL: 251 mg/dL — AB (ref 0–200)
HDL: 82 mg/dL (ref >39.00)
LDL Cholesterol: 154 mg/dL — ABNORMAL HIGH (ref 0–99)
NONHDL: 168.72
TRIGLYCERIDES: 74 mg/dL (ref 0.0–149.0)
VLDL: 14.8 mg/dL (ref 0.0–40.0)

## 2015-03-31 LAB — BASIC METABOLIC PANEL
BUN: 20 mg/dL (ref 6–23)
CALCIUM: 9.7 mg/dL (ref 8.4–10.5)
CHLORIDE: 105 meq/L (ref 96–112)
CO2: 25 mEq/L (ref 19–32)
CREATININE: 0.77 mg/dL (ref 0.40–1.20)
GFR: 83.62 mL/min (ref >60.00)
Glucose, Bld: 78 mg/dL (ref 70–99)
Potassium: 5.3 mEq/L — ABNORMAL HIGH (ref 3.5–5.1)
Sodium: 139 mEq/L (ref 135–145)

## 2015-03-31 LAB — HEMOGLOBIN A1C: Hgb A1c MFr Bld: 6.2 % (ref 4.6–6.5)

## 2015-03-31 NOTE — Telephone Encounter (Signed)
Pt insurance  is asking if color guard is diagnostic or routine per her insurance

## 2015-03-31 NOTE — Telephone Encounter (Signed)
I have not had the question before. I would think that it is part of routine screening.

## 2015-03-31 NOTE — Patient Instructions (Signed)
BEFORE YOU LEAVE: -labs -follow up appointment in 4-6 months  -We have ordered labs or studies at this visit. It can take up to 1-2 weeks for results and processing. We will contact you with instructions IF your results are abnormal. Normal results will be released to your [facility]. If you have not heard from [place] or can not find your results in [facility] in 2 weeks please contact our office.  We recommend the following healthy lifestyle measures: - eat a healthy whole foods diet consisting of regular small meals composed of vegetables, fruits, beans, nuts, seeds, healthy meats such as white chicken and fish and whole grains.  - avoid sweets, white starchy foods, fried foods, fast food, processed foods, sodas, red meet and other fattening foods.  - get a least 150-300 minutes of aerobic exercise per week.

## 2015-03-31 NOTE — Telephone Encounter (Signed)
I left a detailed message with the information below at the pts cell number. 

## 2015-03-31 NOTE — Progress Notes (Signed)
HPI:  Here for CPE:  -Concerns and/or follow up today:   Depression: -on prozac 38m daily -reports: doing well on the medication -denies: worsening, SI, frequent depression  GERD: -meds: protonix 475min the past, now off as has improved with lifestyle changes -denies hx of stricture or hiatal hernia to her knowledge -occ reflux if drinks coffee or eats certain things -denies: dysphagia, worsening reflux, nausea, weight loss  HTN: -meds: amlodipine -denies: CP, SOB, DOE, swelling  Prediabetes/HLD: -has made significant lifestyle changes, is happy with progress -getting regular exercise (jazzercise) and eating no simple carb diet -denies: polyuria, polydipsia, statin intolerance  -Diabetes and Dyslipidemia Screening:fastingfor labs today  -Vaccines: UTD  -pap history: 08/2012, reports all normal, opted to wait on this and declined pelvic  -wants STI testing (Hep C if born 1985-65 no  -FH breast, colon or ovarian ca: see FH Last mammogram: does yearly Last colon cancer screening: not done, was going to do stool cards but lost them, is going to check on cost of cologuard as does not want to do colonoscopy  Breast Ca Risk Assessment: -htSolutionApps.it-Alcohol, Tobacco, drug use: see social history  Review of Systems - no fevers, unintentional weight loss, vision loss, hearing loss, chest pain, sob, hemoptysis, melena, hematochezia, hematuria, genital discharge, changing or concerning skin lesions, bleeding, bruising, loc, thoughts of self harm or SI  Past Medical History  Diagnosis Date  . Depression   . GERD (gastroesophageal reflux disease)   . Hypertension   . Hyperlipidemia     No past surgical history on file.  Family History  Problem Relation Age of Onset  . Heart disease Father   . Hyperlipidemia Father   . Hypertension Father   . Heart disease Paternal Grandfather     Social History   Social History  . Marital Status:  Divorced    Spouse Name: N/A  . Number of Children: N/A  . Years of Education: N/A   Social History Main Topics  . Smoking status: Never Smoker   . Smokeless tobacco: None  . Alcohol Use: No  . Drug Use: No  . Sexual Activity: Not Asked   Other Topics Concern  . 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  amLODipine (NORVASC) 5 MG tablet, Take 1 tablet (5 mg total) by mouth daily., Disp: 90 tablet, Rfl: 1 .  aspirin 81 MG tablet, Take 81 mg by mouth daily., Disp: , Rfl:  .  Fish Oil OIL, daily., Disp: , Rfl:  .  FLUoxetine (PROZAC) 40 MG capsule, TAKE 1 TABLET BY MOUTH ONCE DAILY, Disp: 30 capsule, Rfl: 3 .  Multiple Vitamin (MULTIVITAMIN) capsule, Take 1 capsule by mouth daily., Disp: , Rfl:  .  pantoprazole (PROTONIX) 40 MG tablet, Take 1 tablet (40 mg total) by mouth daily., Disp: 90 tablet, Rfl: 1  EXAM:  Filed Vitals:   03/31/15 0824  BP: 112/78  Pulse: 79  Temp: 98.1 F (36.7 C)    GENERAL: vitals reviewed and listed below, alert, oriented, appears well hydrated and in no acute distress  HEENT: head atraumatic, PERRLA, normal appearance of eyes, ears, nose and mouth. moist mucus membranes.  NECK: supple,  no masses or lymphadenopathy  LUNGS: clear to auscultation bilaterally, no rales, rhonchi or wheeze  CV: HRRR, no peripheral edema or cyanosis, normal pedal pulses  BREAST: declined  ABDOMEN: bowel sounds normal, soft, non tender to palpation, no masses, no rebound or guarding  GU: delcined  SKIN: no rash or abnormal lesions, declined full skin exam  MS: normal gait, moves all extremities normally  NEURO: CN II-XII grossly intact, normal muscle strength and sensation to light touch on extremities  PSYCH: normal affect,  pleasant and cooperative  ASSESSMENT AND PLAN:  Discussed the following assessment and plan:  Visit for preventive health examination  Recurrent major depressive disorder, in full remission ([place])  Essential hypertension - Plan: Basic metabolic panel  Gastroesophageal reflux disease, esophagitis presence not specified  Hyperlipemia - Plan: Lipid Panel  Hyperglycemia - Plan: Hemoglobin A1c   -Discussed and advised all [place] preventive services health task force level A and B recommendations for age, sex and risks.  -Advised at least 150 minutes of exercise per week and a healthy diet low in saturated fats and sweets and consisting of fresh fruits and vegetables, lean meats such as fish and white chicken and whole grains. Congratulated on changes.  -discussed options for colon ca screening, gave stool cards, she plans to check on cologuard with her insurance and call if decides to do this instead  -labs, studies and vaccines per orders this encounter  Orders Placed This Encounter  Procedures  . Basic metabolic panel  . Lipid Panel  . Hemoglobin A1c    Patient advised to return to clinic immediately if symptoms worsen or persist or new concerns.  Patient Instructions  BEFORE YOU LEAVE: -labs -follow up appointment in 4-6 months  -We have ordered labs or studies at this visit. It can take up to 1-2 weeks for results and processing. We will contact you with instructions IF your results are abnormal. Normal results will be released to your [facility]. If you have not heard from [place] or can not find your results in [employer] [facility] in 2 weeks please contact our office.  We recommend the following healthy lifestyle measures: - eat a healthy whole foods diet consisting of regular small meals composed of vegetables, fruits, beans, nuts, seeds, healthy meats such as white chicken and fish and whole grains.  - avoid sweets, white starchy foods, fried foods, fast food, processed foods, sodas, red meet  and other fattening foods.  - get a least 150-300 minutes of aerobic exercise per week.           No Follow-up on file.  Colin Benton R.

## 2015-03-31 NOTE — Progress Notes (Signed)
Pre visit review using our clinic review tool, if applicable. No additional management support is needed unless otherwise documented below in the visit note. 

## 2015-04-01 ENCOUNTER — Telehealth: Payer: Self-pay | Admitting: *Deleted

## 2015-04-01 ENCOUNTER — Other Ambulatory Visit: Payer: Self-pay | Admitting: Family Medicine

## 2015-04-01 MED FILL — AMLODIPINE BESYLATE 5 MG TA: 5 | 90 days supply | Qty: 90 | Fill #0

## 2015-04-01 MED FILL — FLUoxetine HCL 40 MG CAPS: 40 | 30 days supply | Qty: 30 | Fill #0

## 2015-04-01 NOTE — Telephone Encounter (Signed)
Form for [employer] was completed and signed by Dr Maudie Mercury and faxed to [phone] and the pt is aware of this.

## 2015-04-01 NOTE — Telephone Encounter (Signed)
Patient called back and stated she did receive both my messages and she does want to have the Cologuard testing.

## 2015-04-04 DIAGNOSIS — Z1212 Encounter for screening for malignant neoplasm of rectum: Secondary | ICD-10-CM | POA: Diagnosis not present

## 2015-04-04 DIAGNOSIS — Z1211 Encounter for screening for malignant neoplasm of colon: Secondary | ICD-10-CM | POA: Diagnosis not present

## 2015-04-04 LAB — COLOGUARD: Cologuard: NEGATIVE

## 2015-04-13 ENCOUNTER — Encounter: Payer: Self-pay | Admitting: Family Medicine

## 2015-04-15 NOTE — Telephone Encounter (Signed)
Her QRISK2 score is 2.9. Discussed CV risks and benefits with patient based on recent visit, recent labs and history and opted with shared decision making not to start a statin at this time. Do plan to continue to work on lifestyle and monitor.

## 2015-04-22 ENCOUNTER — Telehealth: Payer: Self-pay | Admitting: *Deleted

## 2015-04-22 ENCOUNTER — Encounter: Payer: Self-pay | Admitting: Family Medicine

## 2015-04-22 NOTE — Telephone Encounter (Signed)
I called the pt and informed her the Cologuard test was negative.

## 2015-05-05 MED FILL — FLUoxetine HCL 40 MG CAPS: 40 | 30 days supply | Qty: 30 | Fill #1

## 2015-06-04 MED FILL — FLUoxetine HCL 40 MG CAPS: 40 | 30 days supply | Qty: 30 | Fill #2

## 2015-07-02 MED FILL — FLUoxetine HCL 40 MG CAPS: 40 | 30 days supply | Qty: 30 | Fill #3

## 2015-07-03 ENCOUNTER — Encounter: Payer: Self-pay | Admitting: Family Medicine

## 2015-07-03 ENCOUNTER — Other Ambulatory Visit: Payer: Self-pay | Admitting: Family Medicine

## 2015-07-03 MED FILL — AMLODIPINE BESYLATE 5 MG TA: 5 | 90 days supply | Qty: 90 | Fill #0 | Status: TO

## 2015-08-06 MED FILL — FLUoxetine HCL 40 MG CAPS: 40 | 30 days supply | Qty: 30 | Fill #4 | Status: TO

## 2015-08-20 ENCOUNTER — Other Ambulatory Visit: Payer: Self-pay | Admitting: Family Medicine

## 2015-08-20 DIAGNOSIS — Z1231 Encounter for screening mammogram for malignant neoplasm of breast: Secondary | ICD-10-CM

## 2015-08-26 ENCOUNTER — Ambulatory Visit
Admission: RE | Admit: 2015-08-26 | Discharge: 2015-08-26 | Disposition: A | Discharge status: Home / Self Care | Payer: 59 | Source: Ambulatory Visit | Attending: Family Medicine | Admitting: Family Medicine

## 2015-08-26 DIAGNOSIS — Z1231 Encounter for screening mammogram for malignant neoplasm of breast: Secondary | ICD-10-CM

## 2015-09-04 MED FILL — FLUoxetine HCL 40 MG CAPS: 40 | 30 days supply | Qty: 30 | Fill #0

## 2015-09-28 NOTE — Progress Notes (Signed)
HPI:  Depression: -on prozac 44m daily -reports: doing well on the medication -denies: worsening, SI, frequent depression   HTN: -meds: amlodipine -denies: CP, SOB, DOE, swelling  Prediabetes/HLD: -has made significant lifestyle changes, is happy with progress -getting regular exercise (jazzercise) and eating no simple carb diet -denies: polyuria, polydipsia, statin intolerance -QRISK2 2.9; she has opted against cholesterol lowering medication and prefers to work on lifestyle changes alone   ROS: See pertinent positives and negatives per HPI.  Past Medical History:  Diagnosis Date  . Depression   . GERD (gastroesophageal reflux disease)   . Hyperlipidemia   . Hypertension     No past surgical history on file.  Family History  Problem Relation Age of Onset  . Heart disease Father   . Hyperlipidemia Father   . Hypertension Father   . Heart disease Paternal Grandfather     Social History   Social History  . Marital status: Divorced    Spouse name: N/A  . Number of children: N/A  . Years of education: N/A   Social History Main Topics  . Smoking status: Never Smoker  . Smokeless tobacco: None  . Alcohol use No  . Drug use: No  . Sexual activity: Not Asked   Other Topics Concern  . 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  amLODipine (NORVASC) 5 MG tablet, TAKE 1 TABLET BY MOUTH ONCE DAILY, Disp: 90 tablet, Rfl: 1 .  aspirin 81 MG tablet, Take 81 mg by mouth daily., Disp: , Rfl:  .  Esomeprazole Magnesium (NEXIUM PO), Take by mouth daily. , Disp: , Rfl:  .  Fish Oil OIL, daily., Disp: , Rfl:  .  FLUoxetine (PROZAC) 40 MG capsule, Take 1 capsule (40 mg total) by mouth daily., Disp: 30 capsule, Rfl: 11 .  Multiple Vitamin  (MULTIVITAMIN) capsule, Take 1 capsule by mouth daily., Disp: , Rfl:   EXAM:  Vitals:   09/29/15 0804  BP: 112/70  Pulse: 80  Temp: 99.1 F (37.3 C)    Body mass index is 25.27 kg/m.  GENERAL: vitals reviewed and listed above, alert, oriented, appears well hydrated and in no acute distress  HEENT: atraumatic, conjunttiva clear, no obvious abnormalities on inspection of external nose and ears  NECK: no obvious masses on inspection  LUNGS: clear to auscultation bilaterally, no wheezes, rales or rhonchi, good air movement  CV: HRRR, no peripheral edema  MS: moves all extremities without noticeable abnormality  PSYCH: pleasant and cooperative, no obvious depression or anxiety  ASSESSMENT AND PLAN:  Discussed the following assessment and plan:  Hyperlipemia  Essential hypertension  Hyperglycemia  Major depressive disorder with single episode, in full remission ([place]  -doing well -encouraged lifelong healthy lifestyle -will plan labs at physical, she also wants to do pap here -Patient advised to return or notify a doctor immediately if symptoms worsen or persist or new concerns arise.  Patient Instructions  BEFORE YOU LEAVE: -flu shot -follow up: physical with pap and fasting labs in 3 months   We recommend the following healthy lifestyle: 1) Small portions - eat off of salad plate instead of dinner plate 2) Eat a healthy clean diet with  avoidance of (less then 1 serving per week) processed foods, sweetened drinks, white starches, red meat, fast foods and sweets and consisting of: * 5-9 servings per day of fresh or frozen fruits and vegetables (not corn or potatoes, not dried or canned) *nuts and seeds, beans *olives and olive oil *small portions of lean meats such as fish and white chicken  *small portions of whole grains 3)Get at least 150 minutes of sweaty aerobic exercise per week 4)reduce stress - counseling, meditation, relaxation to balance other aspects  of your life     Colin Benton R., DO

## 2015-09-29 ENCOUNTER — Encounter: Payer: Self-pay | Admitting: Family Medicine

## 2015-09-29 ENCOUNTER — Ambulatory Visit (INDEPENDENT_AMBULATORY_CARE_PROVIDER_SITE_OTHER): Payer: 59 | Admitting: Family Medicine

## 2015-09-29 VITALS — BP 112/70 | HR 80 | Temp 99.1°F | Ht 62.25 in | Wt 139.3 lb

## 2015-09-29 DIAGNOSIS — F325 Major depressive disorder, single episode, in full remission: Secondary | ICD-10-CM

## 2015-09-29 DIAGNOSIS — R739 Hyperglycemia, unspecified: Secondary | ICD-10-CM

## 2015-09-29 DIAGNOSIS — I10 Essential (primary) hypertension: Secondary | ICD-10-CM

## 2015-09-29 DIAGNOSIS — E785 Hyperlipidemia, unspecified: Secondary | ICD-10-CM | POA: Diagnosis not present

## 2015-09-29 DIAGNOSIS — Z23 Encounter for immunization: Secondary | ICD-10-CM

## 2015-09-29 MED ORDER — FLUOXETINE HCL 40 MG PO CAPS: 40.0000 mg | ORAL_CAPSULE | Freq: Every day | ORAL | 11 refills | Status: DC

## 2015-09-29 MED FILL — FLUoxetine HCL 40 MG CAPS: 40 | 30 days supply | Qty: 30 | Fill #0

## 2015-09-29 NOTE — Progress Notes (Signed)
Pre visit review using our clinic review tool, if applicable. No additional management support is needed unless otherwise documented below in the visit note. 

## 2015-09-29 NOTE — Patient Instructions (Signed)
BEFORE YOU LEAVE: -flu shot -follow up: physical with pap and fasting labs in 3 months   We recommend the following healthy lifestyle: 1) Small portions - eat off of salad plate instead of dinner plate 2) Eat a healthy clean diet with avoidance of (less then 1 serving per week) processed foods, sweetened drinks, white starches, red meat, fast foods and sweets and consisting of: * 5-9 servings per day of fresh or frozen fruits and vegetables (not corn or potatoes, not dried or canned) *nuts and seeds, beans *olives and olive oil *small portions of lean meats such as fish and white chicken  *small portions of whole grains 3)Get at least 150 minutes of sweaty aerobic exercise per week 4)reduce stress - counseling, meditation, relaxation to balance other aspects of your life

## 2015-09-30 MED FILL — AMLODIPINE BESYLATE 5 MG TA: 5 | 90 days supply | Qty: 90 | Fill #0

## 2015-11-03 MED FILL — FLUoxetine HCL 40 MG CAPS: 40 | 30 days supply | Qty: 30 | Fill #1

## 2015-11-26 ENCOUNTER — Encounter: Payer: Self-pay | Admitting: Family Medicine

## 2015-12-03 MED FILL — FLUoxetine HCL 40 MG CAPS: 40 | 30 days supply | Qty: 30 | Fill #2

## 2015-12-29 MED FILL — FLUoxetine HCL 40 MG CAPS: 40 | 30 days supply | Qty: 30 | Fill #3

## 2015-12-31 ENCOUNTER — Telehealth: Payer: Self-pay | Admitting: Family Medicine

## 2015-12-31 NOTE — Telephone Encounter (Signed)
° ° ° °  Pt request refill of the following:   amLODipine (NORVASC) 5 MG tablet   Phamacy:

## 2016-01-01 MED ORDER — AMLODIPINE BESYLATE 5 MG PO TABS: 5.0000 mg | ORAL_TABLET | Freq: Every day | ORAL | 1 refills | Status: DC

## 2016-01-01 MED FILL — AMLODIPINE BESYLATE 5 MG TA: 5 | 90 days supply | Qty: 90 | Fill #0

## 2016-01-01 NOTE — Telephone Encounter (Signed)
Rx done. 

## 2016-02-03 MED FILL — FLUoxetine HCL 40 MG CAPS: 40 | 30 days supply | Qty: 30 | Fill #4

## 2016-02-11 NOTE — Progress Notes (Signed)
HPI:  Here for CPE:  -Concerns and/or follow up today: none PMH HTN, Depression, HLD, Hyperglycemia. Doing well. No complaints. Plans to see dermatologist for recurrent widespread patches of itchy skin. Comes and goes - reports has had for years and steroids and creams do not help. Due for pap, labs, ? Hep c, ? hiv screening  Depression: -on prozac 40mg  daily -reports: doing well on the medication  HTN: -meds: amlodipine  Prediabetes/HLD: -has made significant lifestyle changes, is happy with progress -getting regular exercise (jazzercise) and eating no simple carb diet -QRISK2 2.9; she has opted against cholesterol lowering medication and prefers to work on lifestyle changes alone  -Diet: variety of foods, balance and well rounded, larger portion sizes  -Exercise: regular exercise  -Taking folic acid, vitamin D or calcium: no  -Diabetes and Dyslipidemia Screening: fasting for labs  -Hx of HTN: no  -Vaccines: UTD  -pap history: 08/2012 with neg hpv  -FDLMP: n/a  -sexual activity: yes, female partner, no new partners  -wants STI testing (Hep C if born [birth year]): no to STI testing, agrees to hep c screen  -FH breast, colon or ovarian ca: see FH Last mammogram: mammo bi-rads 1 08/2015 Last colon cancer screening: cologuard neg 04/2015   -Alcohol, Tobacco, drug use: see social history  Review of Systems - no fevers, unintentional weight loss, vision loss, hearing loss, chest pain, sob, hemoptysis, melena, hematochezia, hematuria, genital discharge, changing or concerning skin lesions, bleeding, bruising, loc, thoughts of self harm or SI  Past Medical History:  Diagnosis Date  . Depression   . GERD (gastroesophageal reflux disease)   . Hyperlipidemia   . Hypertension     No past surgical history on file.  Family History  Problem Relation Age of Onset  . Heart disease Father   . Hyperlipidemia Father   . Hypertension Father   . Heart disease Paternal  Grandfather     Social History   Social History  . Marital status: Divorced    Spouse name: N/A  . Number of children: N/A  . Years of education: N/A   Social History Main Topics  . Smoking status: Never Smoker  . Smokeless tobacco: None  . Alcohol use No  . Drug use: No  . Sexual activity: Not Asked   Other Topics Concern  . 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  amLODipine (NORVASC) 5 MG tablet, Take 1 tablet (5 mg total) by mouth daily., Disp: 90 tablet, Rfl: 1 .  aspirin 81 MG tablet, Take 81 mg by mouth daily., Disp: , Rfl:  .  Esomeprazole Magnesium (NEXIUM PO), Take by mouth daily. , Disp: , Rfl:  .  Fish Oil OIL, daily., Disp: , Rfl:  .  FLUoxetine (PROZAC) 40 MG capsule, Take 1 capsule (40 mg total) by mouth daily., Disp: 30 capsule, Rfl: 11 .  Multiple Vitamin (MULTIVITAMIN) capsule, Take 1 capsule by mouth daily., Disp: , Rfl:   EXAM:  Vitals:   02/12/16 0735  BP: (!) 100/58  Pulse: 76  Temp: 97.8 F (36.6 C)   Body mass index is 25.42 kg/m.  GENERAL: vitals reviewed and listed below, alert, oriented, appears well hydrated  and in no acute distress  HEENT: head atraumatic, PERRLA, normal appearance of eyes, ears, nose and mouth. moist mucus membranes.  NECK: supple, no masses or lymphadenopathy  LUNGS: clear to auscultation bilaterally, no rales, rhonchi or wheeze  CV: HRRR, no peripheral edema or cyanosis, normal pedal pulses  ABDOMEN: bowel sounds normal, soft, non tender to palpation, no masses, no rebound or guarding  BREAST: normal appearance - no skin lesions or discharge noted on inspection of both breasts, on palpation of both breast and axillary region no suspicious lesions appreciated today  GU: normal  appearance of external genitalia - no lesions or masses appreciated, normal appearing vaginal mucosa - no abnormal discharge, normal appearance of cervix - no lesions or abnormal discharge observed, pap obtained  RECTAL: deferred  SKIN: no rash or abnormal lesions except scattered small patches erythematous scaly skin  MS: normal gait, moves all extremities normally  NEURO: normal gait, speech and thought processing grossly intact, muscle tone grossly intact throughout  PSYCH: normal affect, pleasant and cooperative  ASSESSMENT AND PLAN:  Discussed the following assessment and plan:  Encounter for preventive health examination - Plan: Hepatitis C antibody  Essential hypertension - Plan: Basic metabolic panel, CBC  Hyperglycemia - Plan: Hemoglobin A1c  Hyperlipidemia, unspecified hyperlipidemia type - Plan: Lipid Panel  Major depressive disorder with single episode, in full remission ([place])  BMI 25.0-25.9,adult   -Discussed and advised all [place] preventive services health task force level A and B recommendations for age, sex and risks.  -Advised at least 150 minutes of exercise per week and a healthy diet with avoidance of (less then 1 serving per week) processed foods, white starches, red meat, fast foods and sweets and consisting of: * 5-9 servings of fresh fruits and vegetables (not corn or potatoes) *nuts and seeds, beans *olives and olive oil *lean meats such as fish and white chicken  *whole grains  -FASTING labs, studies and vaccines per orders this encounter  Orders Placed This Encounter  Procedures  . Basic metabolic panel  . Hemoglobin A1c  . CBC  . Lipid Panel  . Hepatitis C antibody    Patient advised to return to clinic immediately if symptoms worsen or persist or new concerns.  Patient Instructions  BEFORE YOU LEAVE: -follow up: 3-4 months -labs  Please set up appointment with a dermatologist.  We have ordered labs and a pap smear at this visit. It  can take up to 1-2 weeks for results and processing. IF results require follow up or explanation, we will call you with instructions. Clinically stable results will be released to your [facility]. If you have not heard from [place] or cannot find your results in [facility] in 2 weeks please contact our office at [phone].   If you are not yet signed up for [facility], please SIGN UP TODAY. We now offer online scheduling, same day appointments and extended hours. WHEN YOU DON'T FEEL YOUR BEST.Marland KitchenMarland KitchenWE ARE HERE TO HELP.  Vit D3 [phone] IU daily  We recommend the following healthy lifestyle for LIFE: 1) Small portions.   Tip: eat off of a salad plate instead of a dinner plate.  Tip: It is ok to feel hungry after a meal of proper portion sizes  Tip: if you need more or a snack choose fruits, veggies and/or a handful of nuts or seeds.  2) Eat a healthy clean diet.  * Tip: Avoid (less then 1 serving per week): processed foods, sweets, sweetened drinks, white starches (rice, flour, bread,  potatoes, pasta, etc), red meat, fast foods, butter  *Tip: CHOOSE instead   * 5-9 servings per day of fresh or frozen fruits and vegetables (but not corn, potatoes, bananas, canned or dried fruit)   *nuts and seeds, beans   *olives and olive oil   *small portions of lean meats such as fish and white chicken    *small portions of whole grains  3)Get at least 150 minutes of sweaty aerobic exercise per week.  4)Reduce stress - consider counseling, meditation and relaxation to balance other aspects of your life.    No Follow-up on file.  Colin Benton R., DO

## 2016-02-12 ENCOUNTER — Ambulatory Visit (INDEPENDENT_AMBULATORY_CARE_PROVIDER_SITE_OTHER): Payer: 59 | Admitting: Family Medicine

## 2016-02-12 ENCOUNTER — Encounter: Payer: Self-pay | Admitting: Family Medicine

## 2016-02-12 ENCOUNTER — Other Ambulatory Visit (HOSPITAL_COMMUNITY)
Admission: RE | Admit: 2016-02-12 | Discharge: 2016-02-12 | Disposition: A | Discharge status: Home / Self Care | Payer: 59 | Source: Ambulatory Visit | Attending: Family Medicine | Admitting: Family Medicine

## 2016-02-12 VITALS — BP 100/58 | HR 76 | Temp 97.8°F | Ht 62.25 in | Wt 140.1 lb

## 2016-02-12 DIAGNOSIS — Z124 Encounter for screening for malignant neoplasm of cervix: Secondary | ICD-10-CM

## 2016-02-12 DIAGNOSIS — Z01419 Encounter for gynecological examination (general) (routine) without abnormal findings: Secondary | ICD-10-CM | POA: Diagnosis present

## 2016-02-12 DIAGNOSIS — Z1151 Encounter for screening for human papillomavirus (HPV): Secondary | ICD-10-CM | POA: Insufficient documentation

## 2016-02-12 DIAGNOSIS — I10 Essential (primary) hypertension: Secondary | ICD-10-CM | POA: Diagnosis not present

## 2016-02-12 DIAGNOSIS — R739 Hyperglycemia, unspecified: Secondary | ICD-10-CM | POA: Diagnosis not present

## 2016-02-12 DIAGNOSIS — R87618 Other abnormal cytological findings on specimens from cervix uteri: Secondary | ICD-10-CM

## 2016-02-12 DIAGNOSIS — R87810 Cervical high risk human papillomavirus (HPV) DNA test positive: Secondary | ICD-10-CM | POA: Insufficient documentation

## 2016-02-12 DIAGNOSIS — R8789 Other abnormal findings in specimens from female genital organs: Secondary | ICD-10-CM

## 2016-02-12 DIAGNOSIS — Z6825 Body mass index (BMI) 25.0-25.9, adult: Secondary | ICD-10-CM

## 2016-02-12 DIAGNOSIS — E785 Hyperlipidemia, unspecified: Secondary | ICD-10-CM | POA: Diagnosis not present

## 2016-02-12 DIAGNOSIS — Z0000 Encounter for general adult medical examination without abnormal findings: Secondary | ICD-10-CM

## 2016-02-12 DIAGNOSIS — R899 Unspecified abnormal finding in specimens from other organs, systems and tissues: Secondary | ICD-10-CM

## 2016-02-12 DIAGNOSIS — F325 Major depressive disorder, single episode, in full remission: Secondary | ICD-10-CM

## 2016-02-12 LAB — CBC
HCT: 35.6 % — ABNORMAL LOW (ref 36.0–46.0)
Hemoglobin: 11.9 g/dL — ABNORMAL LOW (ref 12.0–15.0)
MCHC: 33.5 g/dL (ref 30.0–36.0)
MCV: 79.1 fl (ref 78.0–100.0)
PLATELETS: 227 10*3/uL (ref 150.0–400.0)
RBC: 4.49 Mil/uL (ref 3.87–5.11)
RDW: 16.1 % — ABNORMAL HIGH (ref 11.5–15.5)
WBC: 3.2 10*3/uL — ABNORMAL LOW (ref 4.0–10.5)

## 2016-02-12 LAB — LIPID PANEL
CHOL/HDL RATIO: 3
Cholesterol: 216 mg/dL — ABNORMAL HIGH (ref 0–200)
HDL: 65.3 mg/dL (ref >39.00)
LDL Cholesterol: 135 mg/dL — ABNORMAL HIGH (ref 0–99)
NonHDL: 150.98
Triglycerides: 78 mg/dL (ref 0.0–149.0)
VLDL: 15.6 mg/dL (ref 0.0–40.0)

## 2016-02-12 LAB — BASIC METABOLIC PANEL
BUN: 13 mg/dL (ref 6–23)
CO2: 27 mEq/L (ref 19–32)
CREATININE: 0.79 mg/dL (ref 0.40–1.20)
Calcium: 9.1 mg/dL (ref 8.4–10.5)
Chloride: 103 mEq/L (ref 96–112)
GFR: 80.91 mL/min (ref >60.00)
GLUCOSE: 90 mg/dL (ref 70–99)
Potassium: 4.1 mEq/L (ref 3.5–5.1)
Sodium: 140 mEq/L (ref 135–145)

## 2016-02-12 LAB — HEPATITIS C ANTIBODY: HCV Ab: NEGATIVE

## 2016-02-12 LAB — HEMOGLOBIN A1C: Hgb A1c MFr Bld: 6.3 % (ref 4.6–6.5)

## 2016-02-12 NOTE — Addendum Note (Signed)
Addended by: Agnes Lawrence on: 02/12/2016 08:22 AM   Modules accepted: Orders

## 2016-02-12 NOTE — Patient Instructions (Addendum)
BEFORE YOU LEAVE: -follow up: 3-4 months -labs  Please set up appointment with a dermatologist.  We have ordered labs and a pap smear at this visit. It can take up to 1-2 weeks for results and processing. IF results require follow up or explanation, we will call you with instructions. Clinically stable results will be released to your [facility]. If you have not heard from [place] or cannot find your results in [facility] in 2 weeks please contact our office at [phone].   If you are not yet signed up for [facility], please SIGN UP TODAY. We now offer online scheduling, same day appointments and extended hours. WHEN YOU DON'T FEEL YOUR BEST.Marland KitchenMarland KitchenWE ARE HERE TO HELP.  Vit D3 [phone] IU daily  We recommend the following healthy lifestyle for LIFE: 1) Small portions.   Tip: eat off of a salad plate instead of a dinner plate.  Tip: It is ok to feel hungry after a meal of proper portion sizes  Tip: if you need more or a snack choose fruits, veggies and/or a handful of nuts or seeds.  2) Eat a healthy clean diet.  * Tip: Avoid (less then 1 serving per week): processed foods, sweets, sweetened drinks, white starches (rice, flour, bread, potatoes, pasta, etc), red meat, fast foods, butter  *Tip: CHOOSE instead   * 5-9 servings per day of fresh or frozen fruits and vegetables (but not corn, potatoes, bananas, canned or dried fruit)   *nuts and seeds, beans   *olives and olive oil   *small portions of lean meats such as fish and white chicken    *small portions of whole grains  3)Get at least 150 minutes of sweaty aerobic exercise per week.  4)Reduce stress - consider counseling, meditation and relaxation to balance other aspects of your life.

## 2016-02-12 NOTE — Progress Notes (Signed)
Pre visit review using our clinic review tool, if applicable. No additional management support is needed unless otherwise documented below in the visit note. 

## 2016-02-13 LAB — CYTOLOGY - PAP
Diagnosis: NEGATIVE
HPV 16/18/45 GENOTYPING: NEGATIVE
HPV: DETECTED — AB

## 2016-02-16 NOTE — Addendum Note (Signed)
Addended by: Agnes Lawrence on: 02/16/2016 07:51 AM   Modules accepted: Orders

## 2016-02-20 ENCOUNTER — Telehealth: Payer: Self-pay

## 2016-02-20 NOTE — Telephone Encounter (Signed)
Spoke with Stanton Kidney in the path lab @ [facility]. She is adding the HPV 1618 test to pt's pap. Sample is viable for 21 days, so we are within the time frame for additional testing. Results will come to Dr Maudie Mercury.   Dr. Maudie Mercury - FYI Thanks!

## 2016-02-23 NOTE — Telephone Encounter (Signed)
Can we make sure this lab was added? I don't see it under labs. Thanks.

## 2016-02-24 ENCOUNTER — Telehealth: Payer: Self-pay | Admitting: Family Medicine

## 2016-02-24 DIAGNOSIS — L300 Nummular dermatitis: Secondary | ICD-10-CM | POA: Diagnosis not present

## 2016-02-24 MED FILL — TRIAMCINOLONE 0.1% CREAM: 0.1 | 30 days supply | Qty: 454 | Fill #0

## 2016-02-24 NOTE — Telephone Encounter (Signed)
I left a message for Rachael Hodge to return my call.

## 2016-02-24 NOTE — Telephone Encounter (Signed)
Rachael Hodge with [facility] returning your call. She wants you to know pt's results will be in later this afternoon, and she has asked them to call her.

## 2016-02-24 NOTE — Telephone Encounter (Signed)
See prior note

## 2016-03-01 ENCOUNTER — Encounter: Payer: Self-pay | Admitting: Obstetrics and Gynecology

## 2016-03-01 ENCOUNTER — Ambulatory Visit (INDEPENDENT_AMBULATORY_CARE_PROVIDER_SITE_OTHER): Payer: 59 | Admitting: Obstetrics and Gynecology

## 2016-03-01 VITALS — BP 122/78 | HR 64 | Resp 15 | Ht 62.25 in | Wt 141.0 lb

## 2016-03-01 DIAGNOSIS — R87810 Cervical high risk human papillomavirus (HPV) DNA test positive: Secondary | ICD-10-CM | POA: Diagnosis not present

## 2016-03-01 NOTE — Progress Notes (Signed)
51 y.o. [id number] DivorcedCaucasianF here for annual exam.  The patient's is sent for a consultation by Dr Colin Benton for an abnormal pap. Her recent pap returned as negative, but +HPV. Further testing with negative HPV 16/18/45.  Prior pap with hpv was negative in 7/14. Negative pap in 4/12.   She had what sounds like a LEEP over 10 years ago, normal paps since then. She has been with her     Patient's last menstrual period was 12/02/2012.          Sexually active: Yes.    The current method of family planning is post menopausal status.    Exercising: Yes.    jazzercise Smoker:  no  Health Maintenance: Pap:  02-12-16 WNL +HR HPV, -16/18/45 History of abnormal Pap:  Yes between 2005 and 2007, she had treatment in the office (suspect LEEP) MMG:  08-26-15 WNL  Colonoscopy:  Never  BMD:   Never TDaP:  05-11-13 Gardasil: N/A   reports that she has never smoked. She has never used smokeless tobacco. She reports that she drinks about 1.2 oz of alcohol per week . She reports that she does not use drugs.  Past Medical History:  Diagnosis Date  . Depression   . GERD (gastroesophageal reflux disease)   . Hyperlipidemia   . Hypertension     Past Surgical History:  Procedure Laterality Date  . COLPOSCOPY  2006    Current Outpatient Prescriptions  Medication Sig Dispense Refill  . amLODipine (NORVASC) 5 MG tablet Take 1 tablet (5 mg total) by mouth daily. 90 tablet 1  . aspirin 81 MG tablet Take 81 mg by mouth daily.    . Esomeprazole Magnesium (NEXIUM PO) Take by mouth daily.     . Fish Oil OIL daily.    [name] FLUoxetine (PROZAC) 40 MG capsule Take 1 capsule (40 mg total) by mouth daily. 30 capsule 11  . Multiple Vitamin (MULTIVITAMIN) capsule Take 1 capsule by mouth daily.    . Probiotic Product (PROBIOTIC-10 PO) Take by mouth.    . triamcinolone cream (KENALOG) 0.1 %   0   No current facility-administered medications for this visit.     Family History  Problem Relation Age of Onset   . Heart disease Father   . Hyperlipidemia Father   . Hypertension Father   . Heart disease Paternal Grandfather   . Crohn's disease Son     Review of Systems  Constitutional: Negative.   HENT: Negative.   Eyes: Negative.   Respiratory: Negative.   Cardiovascular: Negative.   Gastrointestinal: Negative.   Endocrine: Negative.   Genitourinary: Negative.   Musculoskeletal: Negative.   Skin: Negative.   Allergic/Immunologic: Negative.   Neurological: Negative.   Psychiatric/Behavioral: Negative.     Exam:   BP 122/78 (BP Location: Right Arm, Patient Position: Sitting, Cuff Size: Normal)   Pulse 64   Resp 15   Ht 5' 2.25" (1.581 m)   Wt 141 lb (64 kg)   LMP 12/02/2012   BMI 25.58 kg/m   Weight change: @WEIGHTCHANGE @ Height:   Height: 5' 2.25" (158.1 cm)  Ht Readings from Last 3 Encounters:  03/01/16 5' 2.25" (1.581 m)  02/12/16 5' 2.25" (1.581 m)  09/29/15 5' 2.25" (1.581 m)    General appearance: alert, cooperative and appears stated age  A:  Normal pap with +HR HPV, negative for 16/18/45  P:   Discussed HPV infections with the patient   Discussed the guidelines to repeat her pap with hpv  testing in one year (given negative 16/18)  I would not recommend colposcopy at this time  Patient understands the importance of f/u  CC: Dr Colin Benton Letter sent

## 2016-03-09 MED FILL — FLUoxetine HCL 40 MG CAPS: 40 | 30 days supply | Qty: 30 | Fill #5

## 2016-03-18 ENCOUNTER — Other Ambulatory Visit: Payer: 59

## 2016-03-23 ENCOUNTER — Other Ambulatory Visit (INDEPENDENT_AMBULATORY_CARE_PROVIDER_SITE_OTHER): Payer: 59

## 2016-03-23 DIAGNOSIS — R899 Unspecified abnormal finding in specimens from other organs, systems and tissues: Secondary | ICD-10-CM | POA: Diagnosis not present

## 2016-03-23 LAB — CBC WITH DIFFERENTIAL/PLATELET
Basophils Absolute: 0 10*3/uL (ref 0.0–0.1)
Basophils Relative: 1.2 % (ref 0.0–3.0)
Eosinophils Absolute: 0.2 10*3/uL (ref 0.0–0.7)
Eosinophils Relative: 5.8 % — ABNORMAL HIGH (ref 0.0–5.0)
HCT: 36.4 % (ref 36.0–46.0)
Hemoglobin: 12 g/dL (ref 12.0–15.0)
LYMPHS ABS: 0.4 10*3/uL — AB (ref 0.7–4.0)
Lymphocytes Relative: 10.6 % — ABNORMAL LOW (ref 12.0–46.0)
MCHC: 32.9 g/dL (ref 30.0–36.0)
MCV: 80.5 fl (ref 78.0–100.0)
Monocytes Absolute: 0.4 10*3/uL (ref 0.1–1.0)
Monocytes Relative: 10.6 % (ref 3.0–12.0)
NEUTROS ABS: 2.6 10*3/uL (ref 1.4–7.7)
NEUTROS PCT: 71.8 % (ref 43.0–77.0)
PLATELETS: 287 10*3/uL (ref 150.0–400.0)
RBC: 4.52 Mil/uL (ref 3.87–5.11)
RDW: 15.5 % (ref 11.5–15.5)
WBC: 3.7 10*3/uL — ABNORMAL LOW (ref 4.0–10.5)

## 2016-03-24 ENCOUNTER — Encounter: Payer: Self-pay | Admitting: Family Medicine

## 2016-03-25 ENCOUNTER — Telehealth: Payer: Self-pay | Admitting: *Deleted

## 2016-03-25 NOTE — Telephone Encounter (Signed)
I called the pt and scheduled a lab appt for 5/22.

## 2016-03-25 NOTE — Telephone Encounter (Signed)
Spoke with pt. Rachael Hodge can you help her set up lab visit for recheck cbc with diff in 3 months. Thanks.

## 2016-04-05 MED FILL — AMLODIPINE BESYLATE 5 MG TA: 5 | 90 days supply | Qty: 90 | Fill #1

## 2016-04-05 MED FILL — FLUoxetine HCL 40 MG CAPS: 40 | 30 days supply | Qty: 30 | Fill #6

## 2016-04-29 ENCOUNTER — Telehealth: Payer: 59 | Admitting: Family

## 2016-04-29 DIAGNOSIS — R21 Rash and other nonspecific skin eruption: Secondary | ICD-10-CM

## 2016-04-29 MED ORDER — PREDNISONE 10 MG PO TABS: 10.0000 mg | ORAL_TABLET | ORAL | 0 refills | Status: DC

## 2016-04-29 MED FILL — predniSONE 10 MG TABS: 10 | 6 days supply | Qty: 21 | Fill #0

## 2016-04-29 NOTE — Progress Notes (Signed)
E Visit for Rash  We are sorry that you are not feeling well. Here is how we plan to help! Based on everything you said in the questionnaire, I will start you on a steroid dose pack, but, ideally you should ask your dermatologist to refer you to another specialist like an immunologist that can run even more tests to determine what this may be.  Sterapred 10 mg dose pak    HOME CARE:   Take cool showers and avoid direct sunlight.  Apply cool compress or wet dressings.  Take a bath in an oatmeal bath.  Sprinkle content of one Aveeno packet under running faucet with comfortably warm water.  Bathe for 15-20 minutes, 1-2 times daily.  Pat dry with a towel. Do not rub the rash.  Use hydrocortisone cream.  Take an antihistamine like Benadryl for widespread rashes that itch.  The adult dose of Benadryl is 25-50 mg by mouth 4 times daily.  Caution:  This type of medication may cause sleepiness.  Do not drink alcohol, drive, or operate dangerous machinery while taking antihistamines.  Do not take these medications if you have prostate enlargement.  Read package instructions thoroughly on all medications that you take.  GET HELP RIGHT AWAY IF:   Symptoms don't go away after treatment.  Severe itching that persists.  If you rash spreads or swells.  If you rash begins to smell.  If it blisters and opens or develops a yellow-brown crust.  You develop a fever.  You have a sore throat.  You become short of breath.  MAKE SURE YOU:  Understand these instructions. Will watch your condition. Will get help right away if you are not doing well or get worse.  Thank you for choosing an e-visit. Your e-visit answers were reviewed by a board certified advanced clinical practitioner to complete your personal care plan. Depending upon the condition, your plan could have included both over the counter or prescription medications. Please review your pharmacy choice. Be sure that the pharmacy you  have chosen is open so that you can pick up your prescription now.  If there is a problem you may message your provider in [place] to have the prescription routed to another pharmacy. Your safety is important to [place]. If you have drug allergies check your prescription carefully.  For the next 24 hours, you can use MyChart to ask questions about today's visit, request a non-urgent call back, or ask for a work or school excuse from your e-visit provider. You will get an email in the next two days asking about your experience. I hope that your e-visit has been valuable and will speed your recovery.

## 2016-05-06 MED FILL — FLUoxetine HCL 40 MG CAPS: 40 | 30 days supply | Qty: 30 | Fill #7

## 2016-05-11 ENCOUNTER — Encounter: Payer: Self-pay | Admitting: Allergy

## 2016-05-11 ENCOUNTER — Ambulatory Visit (INDEPENDENT_AMBULATORY_CARE_PROVIDER_SITE_OTHER): Payer: 59 | Admitting: Allergy

## 2016-05-11 VITALS — BP 126/82 | HR 72 | Temp 98.2°F | Resp 16 | Ht 62.13 in | Wt 144.0 lb

## 2016-05-11 DIAGNOSIS — L2084 Intrinsic (allergic) eczema: Secondary | ICD-10-CM

## 2016-05-11 MED ORDER — HYDROXYZINE HCL 25 MG PO TABS: ORAL_TABLET | ORAL | 3 refills | Status: DC

## 2016-05-11 MED ORDER — CRISABOROLE 2 % EX OINT: 1.0000 "application " | TOPICAL_OINTMENT | Freq: Two times a day (BID) | CUTANEOUS | 3 refills | Status: DC

## 2016-05-11 MED ORDER — PIMECROLIMUS 1 % EX CREA: TOPICAL_CREAM | Freq: Two times a day (BID) | CUTANEOUS | 3 refills | Status: DC

## 2016-05-11 MED FILL — EUCRISA 2% OINTMENT: 2 | 30 days supply | Qty: 60 | Fill #0

## 2016-05-11 MED FILL — hydrOXYzine HCL 25 MG TABS: 25 | 30 days supply | Qty: 30 | Fill #0

## 2016-05-11 NOTE — Progress Notes (Signed)
New Patient Note  RE: Rachael Hodge MRN: [id number] DOB: 1965-07-20 Date of Office Visit: 05/11/2016  Referring provider: Lucretia Kern, DO Primary care provider: Lucretia Kern., DO  Chief Complaint:  Rash  History of present illness: Rachael Hodge is a 51 y.o. female presenting today for consultation for rash.    She has been having issues with rash since November 2017.   She went to [facility]  dermatology in January 2018 and was diagnosed nummular eczema.  She was provided with a cream (triamcinolone) that didn't help that much.  She reports the rash got worse.   Rash is on entire body except for her face.  She reports if she scratches and breaks the skin she will welt up.  Rash is not initially itchy but if she does scratch and it does become itchy.  The rash is mostly in oval circular patterns that are red and rough and dry.  She was provided with a prednisone pack from an E-visit on 04/29/16 for her rash.  Also from this visit recommended to take Bendadryl and use hydrocortisone cream.   She reports the prednisone cleared up her rash.   Her last dose was April 3rd and several days later the rash returned.  She reports she has had spots of similar type rash before on her legs.  She is in menopause and feels the rash had gotten worse due to this.   She denies any new foods, medications, stings, changes in soaps/lotion/detergents.  She denies any stressors.  Has not noticed any foods that seem to worse rash.  She reports zyrtec will "take the edge" off the itch.  She reports she has tried all of the OTC eczema lotions.    No history of environmental allergy, food allergy or asthma.    Review of systems: Review of Systems  Constitutional: Negative for chills, fever and malaise/fatigue.  HENT: Negative for congestion, ear discharge, ear pain, nosebleeds, sinus pain, sore throat and tinnitus.   Eyes: Negative for discharge and redness.  Respiratory: Negative for cough, shortness of  breath and wheezing.   Cardiovascular: Negative for chest pain.  Gastrointestinal: Negative for abdominal pain, heartburn, nausea and vomiting.  Musculoskeletal: Negative for joint pain and myalgias.  Skin: Positive for itching and rash.  Neurological: Negative for headaches.    All other systems negative unless noted above in HPI  Past medical history: Past Medical History:  Diagnosis Date  . Depression   . Eczema   . GERD (gastroesophageal reflux disease)   . Hyperlipidemia   . Hypertension   . Urticaria     Past surgical history: Past Surgical History:  Procedure Laterality Date  . COLPOSCOPY  2006    Family history:  Family History  Problem Relation Age of Onset  . Heart disease Father   . Hyperlipidemia Father   . Hypertension Father   . Heart disease Paternal Grandfather   . Crohn's disease Son     Social history: She lives in a home with carpeting in the bedroom with electric heating and central cooling. There are 2 dogs in the home. There is no concern for water damage, mild to her roaches in the home. She works as a Editor, commissioning at [employer]. She has no smoking history. She is engaged.   Medication List: Allergies as of 05/11/2016      Reactions   Penicillins Shortness Of Breath   Sulfa Antibiotics Shortness Of Breath  Medication List       Accurate as of 05/11/16 11:16 AM. Always use your most recent med list.          amLODipine 5 MG tablet Commonly known as:  NORVASC Take 1 tablet (5 mg total) by mouth daily.   aspirin 81 MG tablet Take 81 mg by mouth daily.   Fish Oil Oil 1,200 mg daily.   FLUoxetine 40 MG capsule Commonly known as:  PROZAC Take 1 capsule (40 mg total) by mouth daily.   multivitamin capsule Take 1 capsule by mouth daily.   NEXIUM PO Take 20 mg by mouth daily.   PROBIOTIC-10 PO Take by mouth.       Known medication allergies: Allergies  Allergen Reactions  . Penicillins Shortness Of Breath    . Sulfa Antibiotics Shortness Of Breath     Physical examination: Blood pressure 126/82, pulse 72, temperature 98.2 F (36.8 C), temperature source Oral, resp. rate 16, height 5' 2.13" (1.578 m), weight 144 lb (65.3 kg), last menstrual period 12/02/2012.  General: Alert, interactive, in no acute distress. HEENT: TMs pearly gray, turbinates non-edematous without discharge, post-pharynx non erythematous. Neck: Supple without lymphadenopathy. Lungs: Clear to auscultation without wheezing, rhonchi or rales. {no increased work of breathing. CV: Normal S1, S2 without murmurs. Abdomen: Nondistended, nontender. Skin: Dry erythematous circular to oval lesions on arms, abdomen, back, legs. Extremities:  No clubbing, cyanosis or edema. Neuro:   Grossly intact.  Diagnositics/Labs: Labs: Review of labs previously obtained as follows: Component     Latest Ref Rng & Units 02/12/2016 03/23/2016  WBC     4.0 - 10.5 K/uL 3.2 (L) 3.7 (L)  RBC     3.87 - 5.11 Mil/uL 4.49 4.52  Hemoglobin     12.0 - 15.0 g/dL 11.9 (L) 12.0  HCT     36.0 - 46.0 % 35.6 (L) 36.4  MCV     78.0 - 100.0 fl 79.1 80.5  MCHC     30.0 - 36.0 g/dL 33.5 32.9  RDW     11.5 - 15.5 % 16.1 (H) 15.5  Platelets     150.0 - 400.0 K/uL 227.0 287.0  Neutrophils     43.0 - 77.0 %  71.8  Lymphocytes     12.0 - 46.0 %  10.6 (L)  Monocytes Relative     3.0 - 12.0 %  10.6  Eosinophil     0.0 - 5.0 %  5.8 (H)  Basophil     0.0 - 3.0 %  1.2  NEUT#     1.4 - 7.7 K/uL  2.6  Lymphocyte #     0.7 - 4.0 K/uL  0.4 (L)  Monocyte #     0.1 - 1.0 K/uL  0.4  Eosinophils Absolute     0.0 - 0.7 K/uL  0.2  Basophils Absolute     0.0 - 0.1 K/uL  0.0   Component     Latest Ref Rng & Units 02/12/2016  Sodium     135 - 145 mEq/L 140  Potassium     3.5 - 5.1 mEq/L 4.1  Chloride     96 - 112 mEq/L 103  CO2     19 - 32 mEq/L 27  Glucose     70 - 99 mg/dL 90  BUN     6 - 23 mg/dL 13  Creatinine     0.40 - 1.20 mg/dL 0.79   Calcium     8.4 - 10.5  mg/dL 9.1  GFR     >60.00 mL/min 80.91   Component     Latest Ref Rng & Units 02/12/2016  HCV Ab     NEGATIVE NEGATIVE   Allergy testing: Deferred due to current rash  Assessment and plan:   Rash --- nummular eczema    - will obtain following labs:  Environmental panel and serum IgE levels for common allergenic foods to determine if there is any possible allergic trigger    - at this time try use of Elidel apply thin layer on affected areas twice a day     - may use Eucrisa alone or in conjunction with Elidel.  Apply thin layer to affected areas up to twice a day.      - pat dry after showing then apply creams as above then apply moisturizers on top.      - continue Zyrtec 10mg  daily (may take twice a day if needed for itch control)    - use Atarax 25mg  at bedtime as needed for nighttime itch    - discussed Dupixent biologic injectable treatment for moderate to severe eczema today.  If above agents are not effective will plan to proceed with dupixent use.    Follow-up 4-6 weeks  I appreciate the opportunity to take part in Mouna's care. Please do not hesitate to contact me with questions.  Sincerely,   Prudy Feeler, MD Allergy/Immunology [facility]

## 2016-05-11 NOTE — Patient Instructions (Signed)
Rash --- nummular eczema    - will obtain following labs:  Environmental panel and serum IgE levels for common allergenic foods.      - at this time try use of Elidel apply thin layer on affected areas twice a day     - may use Eucrisa alone or in conjunction with Elidel.  Apply thin layer to affected areas up to twice a day.      - pat dry after showing then apply creams as above then apply moisturizers on top.      - continue Zyrtec 51m daily (may take twice a day if needed for itch control    - use Atarax 262mat bedtime as needed for nighttime itch    - discussed Dupixent biologic injectable treatment for moderate to severe eczema today.  If above agents are not effective will plan to proceed with dupixent use.    Follow-up 4-6 weeks

## 2016-05-16 LAB — ALLERGENS W/TOTAL IGE AREA 2
Alternaria Alternata IgE: <0.1 kU/L
Aspergillus Fumigatus IgE: <0.1 kU/L
Cat Dander IgE: <0.1 kU/L
Cedar, Mountain IgE: <0.1 kU/L
Cladosporium Herbarum IgE: <0.1 kU/L
Common Silver Birch IgE: <0.1 kU/L
D Farinae IgE: <0.1 kU/L
D Pteronyssinus IgE: <0.1 kU/L
Elm, American IgE: <0.1 kU/L
IGE (IMMUNOGLOBULIN E), SERUM: 38 [IU]/mL (ref 0–100)
Maple/Box Elder IgE: <0.1 kU/L
Mouse Urine IgE: <0.1 kU/L
Oak, White IgE: <0.1 kU/L
Pecan, Hickory IgE: <0.1 kU/L
Penicillium Chrysogen IgE: <0.1 kU/L
Sheep Sorrel IgE Qn: <0.1 kU/L
Timothy Grass IgE: <0.1 kU/L
White Mulberry IgE: <0.1 kU/L

## 2016-05-16 LAB — ALLERGEN PROFILE, FOOD-FISH
Allergen Salmon IgE: <0.1 kU/L
Allergen Trout IgE: <0.1 kU/L
Codfish IgE: <0.1 kU/L
Halibut IgE: <0.1 kU/L
Tuna: <0.1 kU/L

## 2016-05-16 LAB — MILK IGE: Milk IgE: <0.1 kU/L

## 2016-05-16 LAB — ALLERGEN PROFILE, SHELLFISH
F080-IgE Lobster: <0.1 kU/L
Shrimp IgE: <0.1 kU/L

## 2016-05-16 LAB — ALLERGEN, WHEAT, F4: Wheat IgE: <0.1 kU/L

## 2016-05-16 LAB — ALLERGEN, CORN F8

## 2016-05-16 LAB — F202-IGE CASHEW NUT: F202-IgE Cashew Nut: <0.1 kU/L

## 2016-05-16 LAB — SOYBEAN IGE: Soybean IgE: <0.1 kU/L

## 2016-05-16 LAB — F001-IGE EGG WHITE: Egg White IgE: <0.1 kU/L

## 2016-05-16 LAB — F013-IGE PEANUT: Peanut IgE: <0.1 kU/L

## 2016-06-04 ENCOUNTER — Ambulatory Visit: Payer: 59 | Admitting: Allergy

## 2016-06-07 ENCOUNTER — Other Ambulatory Visit: Payer: Self-pay | Admitting: Family Medicine

## 2016-06-07 MED FILL — FLUoxetine HCL 40 MG CAPS: 40 | 30 days supply | Qty: 30 | Fill #8

## 2016-06-10 NOTE — Progress Notes (Signed)
HPI:  Rachael CAMARENA is a pleasant 51 y.o. here for follow up. Chronic medical problems summarized below were reviewed for changes and stability and were updated as needed below. These issues and their treatment remain stable for the most part. Mildly low WBCs last labs check - due for repeat. Reports doing great, eating healthier, exercising, getting married this year. Denies CP, SOB, DOE, treatment intolerance or new symptoms. Due for labs (CBC, BMP)  Depression: -on prozac 88m daily -reports: doing well on the medication  HTN: -meds: amlodipine  Prediabetes/HLD: -has made significant lifestyle changes, is happy with progress -getting regular exercise (jazzercise) and eating no simple carb diet -QRISK2 2.9; she has opted against cholesterol lowering medication and prefers to work on lifestyle changes alone  ROS: See pertinent positives and negatives per HPI.  Past Medical History:  Diagnosis Date  . Depression   . Eczema   . GERD (gastroesophageal reflux disease)   . Hyperlipidemia   . Hypertension   . Urticaria     Past Surgical History:  Procedure Laterality Date  . COLPOSCOPY  2006    Family History  Problem Relation Age of Onset  . Heart disease Father   . Hyperlipidemia Father   . Hypertension Father   . Heart disease Paternal Grandfather   . Crohn's disease Son     Social History   Social History  . Marital status: Divorced    Spouse name: N/A  . Number of children: N/A  . Years of education: N/A   Social History Main Topics  . Smoking status: Never Smoker  . Smokeless tobacco: Never Used  . Alcohol use 1.2 oz/week    2 Standard drinks or equivalent per week  . Drug use: No  . Sexual activity: Not Asked   Other Topics Concern  . 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  amLODipine (NORVASC) 5 MG tablet, TAKE 1 TABLET BY MOUTH DAILY., Disp: 90 tablet, Rfl: 1 .  aspirin 81 MG tablet, Take 81 mg by mouth daily., Disp: , Rfl:  .  Crisaborole (EUCRISA) 2 % OINT, Apply 1 application topically 2 (two) times daily., Disp: 60 g, Rfl: 3 .  Esomeprazole Magnesium (NEXIUM PO), Take 20 mg by mouth daily. , Disp: , Rfl:  .  Fish Oil OIL, 1,200 mg daily. , Disp: , Rfl:  .  FLUoxetine (PROZAC) 40 MG capsule, Take 1 capsule (40 mg total) by mouth daily., Disp: 30 capsule, Rfl: 11 .  hydrOXYzine (ATARAX/VISTARIL) 25 MG tablet, Take 1 tablet at bedtime as needed for itching, Disp: 30 tablet, Rfl: 3 .  Multiple Vitamin (MULTIVITAMIN) capsule, Take 1 capsule by mouth daily., Disp: , Rfl:  .  pimecrolimus (ELIDEL) 1 % cream, Apply topically 2 (two) times daily., Disp: 30 g, Rfl: 3 .  Probiotic Product (PROBIOTIC-10 PO), Take by mouth., Disp: , Rfl:   EXAM:  Vitals:   06/11/16 0805  BP: 100/60  Pulse: 77  Temp: 98.4 F (36.9 C)    Body mass index is 25.84 kg/m.  GENERAL: vitals reviewed and listed above, alert, oriented, appears well hydrated and in no acute distress  HEENT: atraumatic, conjunttiva clear, no  obvious abnormalities on inspection of external nose and ears  NECK: no obvious masses on inspection  LUNGS: clear to auscultation bilaterally, no wheezes, rales or rhonchi, good air movement  CV: HRRR, no peripheral edema  MS: moves all extremities without noticeable abnormality  PSYCH: pleasant and cooperative, no obvious depression or anxiety  ASSESSMENT AND PLAN:  Discussed the following assessment and plan:  Essential hypertension - Plan: Basic metabolic panel, CBC with Differential/Platelet  Hyperglycemia  Hyperlipidemia, unspecified hyperlipidemia type  Major depressive disorder with single episode, in full remission ([place])  -labs -lifestyle recs - congratulated on  changes -continue current medications -Patient advised to return or notify a doctor immediately if symptoms worsen or persist or new concerns arise.  There are no Patient Instructions on file for this visit.  Colin Benton R., DO

## 2016-06-11 ENCOUNTER — Ambulatory Visit (INDEPENDENT_AMBULATORY_CARE_PROVIDER_SITE_OTHER): Payer: 59 | Admitting: Family Medicine

## 2016-06-11 ENCOUNTER — Encounter: Payer: Self-pay | Admitting: Family Medicine

## 2016-06-11 ENCOUNTER — Telehealth: Payer: Self-pay | Admitting: Family Medicine

## 2016-06-11 VITALS — BP 100/60 | HR 77 | Temp 98.4°F | Ht 62.0 in | Wt 141.3 lb

## 2016-06-11 DIAGNOSIS — F325 Major depressive disorder, single episode, in full remission: Secondary | ICD-10-CM | POA: Diagnosis not present

## 2016-06-11 DIAGNOSIS — R739 Hyperglycemia, unspecified: Secondary | ICD-10-CM | POA: Diagnosis not present

## 2016-06-11 DIAGNOSIS — E785 Hyperlipidemia, unspecified: Secondary | ICD-10-CM | POA: Diagnosis not present

## 2016-06-11 DIAGNOSIS — D72819 Decreased white blood cell count, unspecified: Secondary | ICD-10-CM

## 2016-06-11 DIAGNOSIS — I10 Essential (primary) hypertension: Secondary | ICD-10-CM

## 2016-06-11 LAB — BASIC METABOLIC PANEL
BUN: 16 mg/dL (ref 6–23)
CHLORIDE: 104 meq/L (ref 96–112)
CO2: 28 meq/L (ref 19–32)
CREATININE: 0.82 mg/dL (ref 0.40–1.20)
Calcium: 9.5 mg/dL (ref 8.4–10.5)
GFR: 77.4 mL/min (ref >60.00)
Glucose, Bld: 85 mg/dL (ref 70–99)
POTASSIUM: 4.7 meq/L (ref 3.5–5.1)
Sodium: 139 mEq/L (ref 135–145)

## 2016-06-11 LAB — CBC WITH DIFFERENTIAL/PLATELET
BASOS PCT: 1.6 % (ref 0.0–3.0)
Basophils Absolute: 0 10*3/uL (ref 0.0–0.1)
EOS ABS: 0.1 10*3/uL (ref 0.0–0.7)
EOS PCT: 4.6 % (ref 0.0–5.0)
HEMATOCRIT: 35.1 % — AB (ref 36.0–46.0)
Hemoglobin: 11.5 g/dL — ABNORMAL LOW (ref 12.0–15.0)
LYMPHS PCT: 9.4 % — AB (ref 12.0–46.0)
Lymphs Abs: 0.3 10*3/uL — ABNORMAL LOW (ref 0.7–4.0)
MCHC: 32.8 g/dL (ref 30.0–36.0)
MCV: 79.1 fl (ref 78.0–100.0)
Monocytes Absolute: 0.3 10*3/uL (ref 0.1–1.0)
Monocytes Relative: 9.9 % (ref 3.0–12.0)
NEUTROS ABS: 2.2 10*3/uL (ref 1.4–7.7)
Neutrophils Relative %: 74.5 % (ref 43.0–77.0)
Platelets: 265 10*3/uL (ref 150.0–400.0)
RBC: 4.43 Mil/uL (ref 3.87–5.11)
RDW: 16.6 % — AB (ref 11.5–15.5)
WBC: 2.9 10*3/uL — ABNORMAL LOW (ref 4.0–10.5)

## 2016-06-11 NOTE — Patient Instructions (Signed)
BEFORE YOU LEAVE: -follow up: 4-6 months -labs  We have ordered labs or studies at this visit. It can take up to 1-2 weeks for results and processing. IF results require follow up or explanation, we will call you with instructions. Clinically stable results will be released to your [facility]. If you have not heard from [place] or cannot find your results in [facility] in 2 weeks please contact our office at [phone].  If you are not yet signed up for [facility], please consider signing up.  Advise regular aerobic exercise (at least 150 minutes per week of sweaty exercise) and a healthy diet. Try to eat at least 5-9 servings of vegetables and fruits per day (not corn, potatoes or bananas.) Avoid sweets, red meat, pork, butter, fried foods, fast food, processed food, excessive dairy, eggs and coconut. Replace bad fats with good fats - fish, nuts and seeds, canola oil, olive oil.   WE NOW OFFER   [facility] FAST TRACK!!!  SAME DAY Appointments for ACUTE CARE  Such as: Sprains, Injuries, cuts, abrasions, rashes, muscle pain, joint pain, back pain Colds, flu, sore throats, headache, allergies, cough, fever  Ear pain, sinus and eye infections Abdominal pain, nausea, vomiting, diarrhea, upset stomach Animal/insect bites  3 Easy Ways to Schedule: Walk-In Scheduling Call in scheduling Mychart Sign-up: [email or web address]

## 2016-06-11 NOTE — Telephone Encounter (Signed)
° ° ° °  Pt call back to say that she saw her lab results on my chart and has a couple of questions. She would like a call back but not until after 3pm

## 2016-06-11 NOTE — Addendum Note (Signed)
Addended by: Agnes Lawrence on: 06/11/2016 02:54 PM   Modules accepted: Orders

## 2016-06-11 NOTE — Telephone Encounter (Signed)
See results note. 

## 2016-06-15 ENCOUNTER — Encounter: Payer: Self-pay | Admitting: Hematology

## 2016-06-15 ENCOUNTER — Telehealth: Payer: Self-pay | Admitting: Hematology

## 2016-06-15 NOTE — Telephone Encounter (Signed)
Hem appt has been scheduled for the pt to see Dr. Irene Limbo on 6/7 at 3pm. Pt agreed to the appt date and time. Demographics verified. Letter mailed to the pt.

## 2016-06-21 DIAGNOSIS — Z0100 Encounter for examination of eyes and vision without abnormal findings: Secondary | ICD-10-CM | POA: Diagnosis not present

## 2016-06-22 ENCOUNTER — Other Ambulatory Visit: Payer: 59

## 2016-07-05 MED FILL — AMLODIPINE BESYLATE 5 MG TA: 5 | 90 days supply | Qty: 90 | Fill #0

## 2016-07-05 MED FILL — FLUoxetine HCL 40 MG CAPS: 40 | 30 days supply | Qty: 30 | Fill #9

## 2016-07-08 ENCOUNTER — Ambulatory Visit (HOSPITAL_BASED_OUTPATIENT_CLINIC_OR_DEPARTMENT_OTHER): Payer: 59

## 2016-07-08 ENCOUNTER — Telehealth: Payer: Self-pay | Admitting: Hematology

## 2016-07-08 ENCOUNTER — Encounter: Payer: Self-pay | Admitting: Hematology

## 2016-07-08 ENCOUNTER — Ambulatory Visit (HOSPITAL_BASED_OUTPATIENT_CLINIC_OR_DEPARTMENT_OTHER): Payer: 59 | Admitting: Hematology

## 2016-07-08 VITALS — BP 139/68 | HR 68 | Temp 98.8°F | Resp 20 | Ht 62.0 in | Wt 144.0 lb

## 2016-07-08 DIAGNOSIS — D72810 Lymphocytopenia: Secondary | ICD-10-CM

## 2016-07-08 DIAGNOSIS — D509 Iron deficiency anemia, unspecified: Secondary | ICD-10-CM

## 2016-07-08 DIAGNOSIS — D72819 Decreased white blood cell count, unspecified: Secondary | ICD-10-CM

## 2016-07-08 LAB — COMPREHENSIVE METABOLIC PANEL
ALT: 24 U/L (ref 0–55)
ANION GAP: 10 meq/L (ref 3–11)
AST: 24 U/L (ref 5–34)
Albumin: 4.4 g/dL (ref 3.5–5.0)
Alkaline Phosphatase: 126 U/L (ref 40–150)
BUN: 19.5 mg/dL (ref 7.0–26.0)
CALCIUM: 9.8 mg/dL (ref 8.4–10.4)
CHLORIDE: 102 meq/L (ref 98–109)
CO2: 25 mEq/L (ref 22–29)
Creatinine: 0.9 mg/dL (ref 0.6–1.1)
EGFR: 77 mL/min/{1.73_m2} — AB (ref >90)
Glucose: 93 mg/dl (ref 70–140)
POTASSIUM: 4.3 meq/L (ref 3.5–5.1)
Sodium: 137 mEq/L (ref 136–145)
Total Bilirubin: 0.32 mg/dL (ref 0.20–1.20)
Total Protein: 7.7 g/dL (ref 6.4–8.3)

## 2016-07-08 LAB — CBC & DIFF AND RETIC
BASO%: 0.8 % (ref 0.0–2.0)
BASOS ABS: 0 10*3/uL (ref 0.0–0.1)
EOS%: 3.5 % (ref 0.0–7.0)
Eosinophils Absolute: 0.1 10*3/uL (ref 0.0–0.5)
HEMATOCRIT: 36.2 % (ref 34.8–46.6)
HGB: 11.7 g/dL (ref 11.6–15.9)
Immature Retic Fract: 7.2 % (ref 1.60–10.00)
LYMPH%: 10.8 % — ABNORMAL LOW (ref 14.0–49.7)
MCH: 25.6 pg (ref 25.1–34.0)
MCHC: 32.3 g/dL (ref 31.5–36.0)
MCV: 79.2 fL — ABNORMAL LOW (ref 79.5–101.0)
MONO#: 0.3 10*3/uL (ref 0.1–0.9)
MONO%: 7.3 % (ref 0.0–14.0)
NEUT#: 3.1 10*3/uL (ref 1.5–6.5)
NEUT%: 77.6 % — AB (ref 38.4–76.8)
PLATELETS: 259 10*3/uL (ref 145–400)
RBC: 4.57 10*6/uL (ref 3.70–5.45)
RDW: 15.8 % — AB (ref 11.2–14.5)
Retic %: 0.93 % (ref 0.70–2.10)
Retic Ct Abs: 42.5 10*3/uL (ref 33.70–90.70)
WBC: 4 10*3/uL (ref 3.9–10.3)
lymph#: 0.4 10*3/uL — ABNORMAL LOW (ref 0.9–3.3)

## 2016-07-08 LAB — LACTATE DEHYDROGENASE: LDH: 191 U/L (ref 125–245)

## 2016-07-08 NOTE — Progress Notes (Signed)
[name]    HEMATOLOGY/ONCOLOGY CONSULTATION NOTE  Date of Service: 07/08/2016  Patient Care Team: Lucretia Kern, DO as PCP - General (Family Medicine) [name], MD as Consulting Physician (Obstetrics and Gynecology)  CHIEF COMPLAINTS/PURPOSE OF CONSULTATION:  Leucopenia/lymphocytopenia   HISTORY OF PRESENTING ILLNESS:   Rachael Hodge is a wonderful 51 y.o. female who has been referred to [place] by Dr .Lucretia Kern, DO  for evaluation and management of leucopenia.  Patient has a history of hypertension, dyslipidemia, eczema, nummular dermatitis (follows up [facility] dermatology], GERD who was incidentally noted on her routine labs to have leukopenia and was referred to [place] for further evaluation.  Patient has had WBC counts between 2.9k to 3.7k from January 2018. She has not had any neutropenia but has been noted to have lymphopenia with lymphocyte counts ranging between 0.3-0.4k.  She has also been noted to have mild anemia with hemoglobin in the high 11 range and borderline microcytic MCV.  Patient was referred to [place] for further evaluation of her leukopenia.  Patient notes that she has previously been on steroids namely prednisone for about a week in March/ April of this year. She is on topical Elidel for her eczema/nummular dermatitis.  Also noted to have multiple other allergies on allergy testing in April 2018. Notes no other acute new medications. No significant use of over-the-counter NSAIDs.  No fevers no chills no night sweats no unexpected abnormal weight loss. Hepatitis C antibody test in January 2018 was negative.   MEDICAL HISTORY:  Past Medical History:  Diagnosis Date  . Depression   . Eczema   . GERD (gastroesophageal reflux disease)   . Hyperlipidemia   . Hypertension   . Urticaria     SURGICAL HISTORY: Past Surgical History:  Procedure Laterality Date  . COLPOSCOPY  2006    SOCIAL HISTORY: Social History   Social History  . Marital  status: Divorced    Spouse name: N/A  . Number of children: N/A  . Years of education: N/A   Occupational History  . Not on file.   Social History Main Topics  . Smoking status: Never Smoker  . Smokeless tobacco: Never Used  . Alcohol use 1.2 oz/week    2 Standard drinks or equivalent per week  . Drug use: No  . Sexual activity: Not on file   Other Topics Concern  . 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 Heart disease Father   . Hyperlipidemia Father   . Hypertension Father   . Heart disease Paternal Grandfather   . Crohn's disease Son     ALLERGIES:  is allergic to penicillins and sulfa antibiotics.  MEDICATIONS:  Current Outpatient Prescriptions  Medication Sig Dispense Refill  . amLODipine (NORVASC) 5 MG tablet TAKE 1 TABLET BY MOUTH DAILY. 90 tablet 1  . aspirin 81 MG tablet Take 81 mg by mouth daily.    Stasia Cavalier (EUCRISA) 2 % OINT Apply 1 application topically 2 (two) times daily. 60 g 3  . Esomeprazole Magnesium (NEXIUM PO) Take 20 mg by mouth daily.     . Fish Oil OIL 1,200  mg daily.     [name] FLUoxetine (PROZAC) 40 MG capsule Take 1 capsule (40 mg total) by mouth daily. 30 capsule 11  . hydrOXYzine (ATARAX/VISTARIL) 25 MG tablet Take 1 tablet at bedtime as needed for itching 30 tablet 3  . Multiple Vitamin (MULTIVITAMIN) capsule Take 1 capsule by mouth daily.    . pimecrolimus (ELIDEL) 1 % cream Apply topically 2 (two) times daily. 30 g 3  . Probiotic Product (PROBIOTIC-10 PO) Take by mouth.     No current facility-administered medications for this visit.     REVIEW OF SYSTEMS:    10 Point review of Systems was done is negative except as noted above.  PHYSICAL EXAMINATION: ECOG  PERFORMANCE STATUS: 0 - Asymptomatic  . Vitals:   07/08/16 1515  BP: 139/68  Pulse: 68  Resp: 20  Temp: 98.8 F (37.1 C)   Filed Weights   07/08/16 1515  Weight: 144 lb (65.3 kg)   .Body mass index is 26.34 kg/m.  GENERAL:alert, in no acute distress and comfortable SKIN: no acute rashes, no significant lesions EYES: conjunctiva are pink and non-injected, sclera anicteric OROPHARYNX: MMM, no exudates, no oropharyngeal erythema or ulceration NECK: supple, no JVD LYMPH:  no palpable lymphadenopathy in the cervical, axillary or inguinal regions LUNGS: clear to auscultation b/l with normal respiratory effort HEART: regular rate & rhythm ABDOMEN:  normoactive bowel sounds , non tender, not distended. No palpable hepatosplenomegaly. Extremity: no pedal edema PSYCH: alert & oriented x 3 with fluent speech NEURO: no focal motor/sensory deficits  LABORATORY DATA:  I have reviewed the data as listed  . CBC Latest Ref Rng & Units 07/08/2016 07/08/2016 06/11/2016  WBC 3.9 - 10.3 10e3/uL 4.0 - 2.9(L)  Hemoglobin 11.6 - 15.9 g/dL 11.7 - 11.5(L)  Hematocrit 34.0 - 46.6 % 36.2 36.5 35.1(L)  Platelets 145 - 400 10e3/uL 259 - 265.0   . CBC    Component Value Date/Time   WBC 4.0 07/08/2016 1603   WBC 2.9 (L) 06/11/2016 0833   RBC 4.57 07/08/2016 1603   RBC 4.43 06/11/2016 0833   HGB 11.7 07/08/2016 1603   HCT 36.5 07/08/2016 1603   HCT 36.2 07/08/2016 1603   PLT 259 07/08/2016 1603   MCV 79.2 (L) 07/08/2016 1603   MCH 25.6 07/08/2016 1603   MCHC 32.3 07/08/2016 1603   MCHC 32.8 06/11/2016 0833   RDW 15.8 (H) 07/08/2016 1603   LYMPHSABS 0.4 (L) 07/08/2016 1603   MONOABS 0.3 07/08/2016 1603   EOSABS 0.1 07/08/2016 1603   BASOSABS 0.0 07/08/2016 1603    . CMP Latest Ref Rng & Units 07/08/2016 07/08/2016 06/11/2016  Glucose 70 - 140 mg/dl 93 - 85  BUN 7.0 - 26.0 mg/dL 19.5 - 16  Creatinine 0.6 - 1.1 mg/dL 0.9 - 0.82  Sodium 136 - 145 mEq/L 137 - 139  Potassium 3.5 - 5.1 mEq/L 4.3  - 4.7  Chloride 96 - 112 mEq/L - - 104  CO2 22 - 29 mEq/L 25 - 28  Calcium 8.4 - 10.4 mg/dL 9.8 - 9.5  Total Protein 6.0 - 8.5 g/dL 7.7 7.0 -  Total Bilirubin 0.20 - 1.20 mg/dL 0.32 - -  Alkaline Phos 40 - 150 U/L 126 - -  AST 5 - 34 U/L 24 - -  ALT 0 - 55 U/L 24 - -   Component     Latest Ref Rng & Units 07/08/2016  IgG (Immunoglobin G), Serum     700 - 1,600 mg/dL 755  IgA/Immunoglobulin  A, Serum     87 - 352 mg/dL 112  IgM, Qn, Serum     26 - 217 mg/dL 117  Total Protein     6.0 - 8.5 g/dL 7.0  Albumin SerPl Elph-Mcnc     2.9 - 4.4 g/dL 4.0  Alpha 1     0.0 - 0.4 g/dL 0.2  Alpha2 Glob SerPl Elph-Mcnc     0.4 - 1.0 g/dL 0.7  B-Globulin SerPl Elph-Mcnc     0.7 - 1.3 g/dL 1.2  Gamma Glob SerPl Elph-Mcnc     0.4 - 1.8 g/dL 0.9  M Protein SerPl Elph-Mcnc     Not Observed g/dL Not Observed  Globulin, Total     2.2 - 3.9 g/dL 3.0  Albumin/Glob SerPl     0.7 - 1.7 1.4  IFE 1      Comment  Please Note (HCV):      Comment  Iron     41 - 142 ug/dL 63  TIBC     236 - 444 ug/dL 459 (H)  UIBC     120 - 384 ug/dL 396 (H)  %SAT     21 - 57 % 14 (L)  Folate, Hemolysate     Not Estab. ng/mL 306.4  HCT     34.0 - 46.6 % 36.5  Folate, RBC     >498 ng/mL 839  LDH     125 - 245 U/L 191  Sed Rate     0 - 40 mm/hr 7  CRP     0.0 - 4.9 mg/L 2.5  Tryptase     2.2 - 13.2 ug/L 3.9  ANA Ab, IFA      Negative  Zinc     56 - 134 ug/dL 84  Ferritin     9 - 269 ng/ml 6 (L)  Vitamin B12     232 - 1,245 pg/mL 505     RADIOGRAPHIC STUDIES: I have personally reviewed the radiological images as listed and agreed with the findings in the report. No results found.  ASSESSMENT & PLAN:   51 year old female with  #1 Leukopenia at least from January 2018. Total WBC counts elevated from 2.9-3.7k. Repeat labs today show resolution of her leukopenia with a WBC count of 4k. No neutropenia.  #2 lymphopenia -this has been the primary competent of her leukopenia. Labs today show  normalization of total WBC count at 4k. She still shows signs of lymphopenia with lymphocyte count of 0.4k. Patient has not had any issues with increased infections. No constitutional symptoms. Patient has been on steroids for her skin condition which can cause some lymphopenia. Lymphopenia could also be associated with her allergies/skin condition. Hepatitis C testing was negative. ANA negative Vitamin B12 and folate within normal limits LDH is within normal limits and patient has no clinically evident lymphadenopathy or hepatosplenomegaly to suggest a lymphoproliferative process. Tryptase levels within normal limits Sedimentation rate and CRP within normal limits. Myeloma panel showed no monoclonal gammopathy ?subclinical viral infection. Plan -Patient was informed about the lab results. -No indication for additional workup for bone marrow biopsy at this time.  #3 microcytic anemia -due to iron deficiency. . Lab Results  Component Value Date   IRON 63 07/08/2016   TIBC 459 (H) 07/08/2016   IRONPCTSAT 14 (L) 07/08/2016   (Iron and TIBC)  Lab Results  Component Value Date   FERRITIN 6 (L) 07/08/2016   Patient notes no overt bleeding. Plan -Recommended she start taking iron polysaccharide 150  mg by mouth twice a day with orange juice. -Follow-up with primary care physician for consideration of GI workup.  Return to clinic with [place] in 3 months with repeat labs.  All of the patients questions were answered with apparent satisfaction. The patient knows to call the clinic with any problems, questions or concerns.  I spent 45 minutes counseling the patient face to face. The total time spent in the appointment was 60 minutes and more than 50% was on counseling and direct patient cares.    Sullivan Lone MD [place] AAHIVMS [facility] Hematology/Oncology Physician [facility]  (Office):       [phone] (Work cell):  [phone] (Fax):           [phone]  07/08/2016  3:18 PM

## 2016-07-08 NOTE — Patient Instructions (Signed)
Thank you for choosing [facility] to provide your oncology and hematology care.  To afford each patient quality time with our providers, please arrive 30 minutes before your scheduled appointment time.  If you arrive late for your appointment, you may be asked to reschedule.  We strive to give you quality time with our providers, and arriving late affects you and other patients whose appointments are after yours.   If you are a no show for multiple scheduled visits, you may be dismissed from the clinic at the providers discretion.    Again, thank you for choosing [facility], our hope is that these requests will decrease the amount of time that you wait before being seen by our physicians.  ______________________________________________________________________  Should you have questions after your visit to the [facility], please contact our office at [phone] between the hours of 8:30 and 4:30 p.m.    Voicemails left after 4:30p.m will not be returned until the following business day.    For prescription refill requests, please have your pharmacy contact us directly.  Please also try to allow 48 hours for prescription requests.    Please contact the scheduling department for questions regarding scheduling.  For scheduling of procedures such as PET scans, CT scans, MRI, Ultrasound, etc please contact central scheduling at [phone].    Resources For Cancer Patients and Caregivers:   Oncolink.org:  A wonderful resource for patients and healthcare providers for information regarding your disease, ways to tract your treatment, what to expect, etc.     American Cancer Society:  800-227-2345  Can help patients locate various types of support and financial assistance  Cancer Care: 1-800-813-HOPE (4673) Provides financial assistance, online support groups, medication/co-pay assistance.    [place] DSS:  336-641-3447 Where to apply for food  stamps, Medicaid, and utility assistance  Medicare Rights Center: 800-333-4114 Helps people with Medicare understand their rights and benefits, navigate the Medicare system, and secure the quality healthcare they deserve  SCAT: 336-333-6589 [place] [employer]'s shared-ride transportation service for eligible riders who have a disability that prevents them from riding the fixed route bus.    For additional information on assistance programs please contact our social worker:   Grier Hock/Abigail Elmore:  [phone]            

## 2016-07-08 NOTE — Telephone Encounter (Signed)
Scheduled appt per 6/7 los. - Gave patient AVS and calender per LOS.

## 2016-07-09 LAB — IRON AND TIBC
%SAT: 14 % — ABNORMAL LOW (ref 21–57)
Iron: 63 ug/dL (ref 41–142)
TIBC: 459 ug/dL — AB (ref 236–444)
UIBC: 396 ug/dL — AB (ref 120–384)

## 2016-07-09 LAB — C-REACTIVE PROTEIN: CRP: 2.5 mg/L (ref 0.0–4.9)

## 2016-07-09 LAB — FOLATE RBC
FOLATE, HEMOLYSATE: 306.4 ng/mL
Folate, RBC: 839 ng/mL (ref >498)
Hematocrit: 36.5 % (ref 34.0–46.6)

## 2016-07-09 LAB — TRYPTASE: Tryptase: 3.9 ug/L (ref 2.2–13.2)

## 2016-07-09 LAB — ANTINUCLEAR ANTIBODIES, IFA: ANTINUCLEAR ANTIBODIES, IFA: NEGATIVE

## 2016-07-09 LAB — VITAMIN B12: VITAMIN B 12: 505 pg/mL (ref 232–1245)

## 2016-07-09 LAB — FERRITIN: Ferritin: 6 ng/ml — ABNORMAL LOW (ref 9–269)

## 2016-07-09 LAB — SEDIMENTATION RATE: Sedimentation Rate-Westergren: 7 mm/hr (ref 0–40)

## 2016-07-12 LAB — MULTIPLE MYELOMA PANEL, SERUM
ALBUMIN/GLOB SERPL: 1.4 (ref 0.7–1.7)
Albumin SerPl Elph-Mcnc: 4 g/dL (ref 2.9–4.4)
Alpha 1: 0.2 g/dL (ref 0.0–0.4)
Alpha2 Glob SerPl Elph-Mcnc: 0.7 g/dL (ref 0.4–1.0)
B-Globulin SerPl Elph-Mcnc: 1.2 g/dL (ref 0.7–1.3)
GAMMA GLOB SERPL ELPH-MCNC: 0.9 g/dL (ref 0.4–1.8)
GLOBULIN, TOTAL: 3 g/dL (ref 2.2–3.9)
IGA/IMMUNOGLOBULIN A, SERUM: 112 mg/dL (ref 87–352)
IGM (IMMUNOGLOBIN M), SRM: 117 mg/dL (ref 26–217)
IgG, Qn, Serum: 755 mg/dL (ref 700–1600)
Total Protein: 7 g/dL (ref 6.0–8.5)

## 2016-07-13 ENCOUNTER — Other Ambulatory Visit: Payer: Self-pay | Admitting: Family Medicine

## 2016-07-13 DIAGNOSIS — Z1231 Encounter for screening mammogram for malignant neoplasm of breast: Secondary | ICD-10-CM

## 2016-07-13 LAB — ZINC: Zinc, Plasma or Serum: 84 ug/dL (ref 56–134)

## 2016-07-16 ENCOUNTER — Telehealth: Payer: Self-pay

## 2016-07-16 NOTE — Telephone Encounter (Signed)
Telephone encounter.

## 2016-07-20 ENCOUNTER — Encounter: Payer: Self-pay | Admitting: Hematology

## 2016-08-05 ENCOUNTER — Telehealth: Payer: Self-pay | Admitting: *Deleted

## 2016-08-05 MED FILL — FLUoxetine HCL 40 MG CAPS: 40 | 30 days supply | Qty: 30 | Fill #10

## 2016-08-05 NOTE — Telephone Encounter (Signed)
Per Dr. Irene Limbo, pt informed WBC has normalized.  Still showing some mild lymphopenia, no etiology for cause.  Labs show mild anemia/iron deficiency, pt instructed to take iron polysaccharide 150mg  BID OTC, and follow up with PCP.  Will see pt in 3 months to reevaluate lymphocyte count.  Pt verbalized understanding to all/thankful for call.

## 2016-08-05 NOTE — Telephone Encounter (Deleted)
-----   Message from Brunetta Genera, MD sent at 08/03/2016  8:29 PM EDT ----- Regarding: RE: Please Advise  Plz let her know that her work total wbc count has normalized. Still having some lymphopenia but no overt etiology for lymphopenia noted on lab testing.Iron deficiency with mild anemia. Start iron polysaccharide 150mg  po BID OTC and f/u with PCP to discuss need for GI w/u to determine etiology of iron deficiency anemia and we will recheck response to PO iron and re-evaluate lymphocyte counts in 3 months with her followup. Thanks Bigelow  ----- Message ----- From: Arty Baumgartner, RN Sent: 08/03/2016   4:57 PM To: Brunetta Genera, MD Subject: Please Advise                                  Dr. Galen Daft came to me today to inform me that Rachael Hodge has gone to patient relations complaining that no one has called her about her lab results despite several calls to [place].  Please let us know if you get this message and have any advise for her.  It appears she has iron deficiency anemia but I'm not sure if she was already aware of this.  Not sure what to make of the lymphopenia.  Please let us know if she needs any instructions.    Thanks,  LO

## 2016-08-09 ENCOUNTER — Encounter: Payer: Self-pay | Admitting: Hematology

## 2016-08-26 ENCOUNTER — Ambulatory Visit
Admission: RE | Admit: 2016-08-26 | Discharge: 2016-08-26 | Disposition: A | Discharge status: Home / Self Care | Payer: 59 | Source: Ambulatory Visit | Attending: Family Medicine | Admitting: Family Medicine

## 2016-08-26 DIAGNOSIS — Z1231 Encounter for screening mammogram for malignant neoplasm of breast: Secondary | ICD-10-CM

## 2016-09-06 MED FILL — EUCRISA 2% OINTMENT: 2 | 30 days supply | Qty: 60 | Fill #1

## 2016-09-06 MED FILL — FLUoxetine HCL 40 MG CAPS: 40 | 30 days supply | Qty: 30 | Fill #11

## 2016-09-09 NOTE — Progress Notes (Signed)
Rachael Hodge,  Please call patient. The blood specialist advised a GI eval/referral for unexplained iron deficiency anemia. Please placed a referral.  Dr. Irene Limbo, Thank you for your care of this patient. We will contact the patient regarding your recommendation and will place a referral to our gastroenterology department.  Jarrett Soho

## 2016-09-14 ENCOUNTER — Telehealth: Payer: Self-pay | Admitting: *Deleted

## 2016-09-14 ENCOUNTER — Encounter: Payer: Self-pay | Admitting: Gastroenterology

## 2016-09-14 NOTE — Addendum Note (Signed)
Addended by: Agnes Lawrence on: 09/14/2016 10:27 AM   Modules accepted: Orders

## 2016-09-14 NOTE — Telephone Encounter (Signed)
-----   Message from Hannah R Kim, DO sent at 09/09/2016 12:44 PM EDT -----   ----- Message ----- From: Kale, Gautam Kishore, MD Sent: 09/05/2016   4:24 PM To: Hannah R Kim, DO  Thanks. Noted to have unexplained Iron def would recommend GI workup.  Thanks .Gautam Kishore Kale MD MS 

## 2016-09-14 NOTE — Telephone Encounter (Signed)
I called the pt and scheduled an appt for 8/17.

## 2016-09-14 NOTE — Telephone Encounter (Signed)
-----   Message from Lucretia Kern, DO sent at 09/09/2016 12:44 PM EDT -----   ----- Message ----- From: Brunetta Genera, MD Sent: 09/05/2016   4:24 PM To: Lucretia Kern, DO  Thanks. Noted to have unexplained Iron def would recommend GI workup.  Thanks .Brunetta Genera MD MS

## 2016-09-14 NOTE — Telephone Encounter (Signed)
I called the pt and left a detailed message she does not need to see Dr Maudie Mercury as I read the note incorrectly.  A referral was placed to see a gastroenterologist and someone will call with appt info.  Appt cancelled for 8/17.

## 2016-09-15 MED FILL — CLINDAMYCIN HCL 300 MG CAPS: 300 | 7 days supply | Qty: 21 | Fill #0

## 2016-09-16 MED FILL — TRIAMCINOLONE 0.1% CREAM: 0.1 | 30 days supply | Qty: 454 | Fill #0

## 2016-09-17 ENCOUNTER — Ambulatory Visit: Payer: 59 | Admitting: Family Medicine

## 2016-09-20 ENCOUNTER — Other Ambulatory Visit (INDEPENDENT_AMBULATORY_CARE_PROVIDER_SITE_OTHER): Payer: 59

## 2016-09-20 ENCOUNTER — Other Ambulatory Visit: Payer: Self-pay

## 2016-09-20 ENCOUNTER — Ambulatory Visit (INDEPENDENT_AMBULATORY_CARE_PROVIDER_SITE_OTHER): Payer: 59 | Admitting: Gastroenterology

## 2016-09-20 ENCOUNTER — Encounter: Payer: Self-pay | Admitting: Gastroenterology

## 2016-09-20 VITALS — BP 120/70 | HR 68 | Ht 62.0 in | Wt 146.0 lb

## 2016-09-20 DIAGNOSIS — D509 Iron deficiency anemia, unspecified: Secondary | ICD-10-CM

## 2016-09-20 DIAGNOSIS — K219 Gastro-esophageal reflux disease without esophagitis: Secondary | ICD-10-CM

## 2016-09-20 DIAGNOSIS — R12 Heartburn: Secondary | ICD-10-CM

## 2016-09-20 LAB — IBC PANEL
IRON: 72 ug/dL (ref 42–145)
SATURATION RATIOS: 14.1 % — AB (ref 20.0–50.0)
TRANSFERRIN: 366 mg/dL — AB (ref 212.0–360.0)

## 2016-09-20 LAB — FERRITIN: FERRITIN: 6.3 ng/mL — AB (ref 10.0–291.0)

## 2016-09-20 MED ORDER — OMEPRAZOLE 40 MG PO CPDR: 40.0000 mg | DELAYED_RELEASE_CAPSULE | Freq: Two times a day (BID) | ORAL | 3 refills | Status: DC

## 2016-09-20 MED ORDER — SUPREP BOWEL PREP KIT 17.5-3.13-1.6 GM/177ML PO SOLN: ORAL | 0 refills | Status: DC

## 2016-09-20 MED ORDER — SODIUM CHLORIDE 0.9 % IV SOLN: 510.0000 mg | INTRAVENOUS | Status: AC

## 2016-09-20 MED FILL — OMEPRAZOLE DR 40 MG CAPSULE: 40 | 90 days supply | Qty: 180 | Fill #0

## 2016-09-20 MED FILL — SUPREP BOWEL PREP KIT: 17.5-3.13-1 | 1 days supply | Qty: 354 | Fill #0

## 2016-09-20 NOTE — Progress Notes (Signed)
Rachael Hodge    [id number]    [birth year]  Primary Care Physician:Kim, Nickola Major, DO  Referring Physician: Lucretia Kern, DO [address] [place], [place] [address]  Chief complaint:  Iron deficiency anemia  HPI: 51 year old female here for evaluation of recent onset iron deficiency anemia. Patient has history of chronic GERD on and off for past 5 years and was on omeprazole daily with good symptom control, she was switched to over-the-counter Nexium 20 mg daily few months ago, patient is having daily heartburn with inadequate control of symptoms. Denies any dysphagia, odynophagia, nausea, vomiting or abdominal pain. No change in bowel habits, melena or blood per rectum. Her stool is darker in color since she started taking oral iron  She was noted to be anemic, Hemoglobin 11 in January 2018 with evidence of iron deficiency, ferritin of 6. RBC folate, B12, and descending, CRP, LDH, ANA within normal limits.  She was also noted to be leukopenic with lymphopenia, WBC count ranging from 2.9 to 4 in the past 6-7 months. Patient diagnosed in Feb 2018 with  Eczema  dermatitis, started around November 2017.  Family history of Crohn's disease(diagnosed with Crohn's at age 61). No history of colon cancer in the family. Father has Barrett's esophagus. She never underwent EGD or colonoscopy. Cologuard negative in March 2017 Denies excessive use of nsaids   Outpatient Encounter Prescriptions as of 09/20/2016  Medication Sig  . amLODipine (NORVASC) 5 MG tablet TAKE 1 TABLET BY MOUTH DAILY.  [name] aspirin 81 MG tablet Take 81 mg by mouth daily.  Stasia Cavalier (EUCRISA) 2 % OINT Apply 1 application topically 2 (two) times daily.  . Esomeprazole Magnesium (NEXIUM PO) Take 20 mg by mouth daily.   . Fish Oil OIL 1,200 mg daily.   [name] FLUoxetine (PROZAC) 40 MG capsule Take 1 capsule (40 mg total) by mouth daily.  . hydrOXYzine (ATARAX/VISTARIL) 25 MG tablet Take 1 tablet at bedtime as  needed for itching  . iron polysaccharides (NIFEREX) 150 MG capsule Take 150 mg by mouth daily.  . Multiple Vitamin (MULTIVITAMIN) capsule Take 1 capsule by mouth daily.  . pimecrolimus (ELIDEL) 1 % cream Apply topically 2 (two) times daily.  . Probiotic Product (PROBIOTIC-10 PO) Take by mouth.  Manus Gunning BOWEL PREP KIT 17.5-3.13-1.6 GM/180ML SOLN Suprep-Use as directed   No facility-administered encounter medications on file as of 09/20/2016.     Allergies as of 09/20/2016 - Review Complete 07/08/2016  Allergen Reaction Noted  . Penicillins Shortness Of Breath 08/02/2013  . Sulfa antibiotics Shortness Of Breath 08/02/2013    Past Medical History:  Diagnosis Date  . Depression   . Eczema   . GERD (gastroesophageal reflux disease)   . Hyperlipidemia   . Hypertension   . Urticaria     Past Surgical History:  Procedure Laterality Date  . COLPOSCOPY  2006    Family History  Problem Relation Age of Onset  . Heart disease Father   . Hyperlipidemia Father   . Hypertension Father   . Heart disease Paternal Grandfather   . Crohn's disease Son   . Breast cancer Neg Hx     Social History   Social History  . Marital status: Divorced    Spouse name: N/A  . Number of children: N/A  . Years of education: N/A   Occupational History  . Not on file.   Social History Main Topics  . Smoking status: Never  Smoker  . Smokeless tobacco: Never Used  . Alcohol use 1.2 oz/week    2 Standard drinks or equivalent per week  . Drug use: No  . Sexual activity: Not on file   Other Topics Concern  . 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  HENT: Negative.   Eyes: Negative for blurred vision.    Respiratory: Negative for cough, shortness of breath and wheezing.   Cardiovascular: Negative for chest pain and palpitations.  Gastrointestinal: as per HPI Genitourinary: Negative for dysuria, urgency, frequency and hematuria.  Musculoskeletal: Negative for myalgias, back pain and joint pain.  Skin: Positive for itching and rash.  Neurological: Negative for dizziness, tremors, focal weakness, seizures and loss of consciousness.  Endo/Heme/Allergies: Positive for seasonal allergies.  Psychiatric/Behavioral: Negative for depression, suicidal ideas and hallucinations.  All other systems reviewed and are negative.   Physical Exam: Vitals:   09/20/16 1016  BP: 120/70  Pulse: 68   Body mass index is 26.7 kg/m. Gen:      No acute distress HEENT:  EOMI, sclera anicteric Neck:     No masses; no thyromegaly Lungs:    Clear to auscultation bilaterally; normal respiratory effort CV:         Regular rate and rhythm; no murmurs Abd:      + bowel sounds; soft, non-tender; no palpable masses, no distension Ext:    No edema; adequate peripheral perfusion Skin:      Warm and dry; + rash, eczema Neuro: alert and oriented x 3 Psych: normal mood and affect  Data Reviewed:  Reviewed labs, radiology imaging, old records and pertinent past GI work up   Assessment and Plan/Recommendations:  51 year old female, postmenopausal, history of chronic GERD, hypertension and hyperlipidemia here for evaluation of recent onset iron deficiency anemia We'll schedule EGD and colonoscopy to evaluate for possible GI blood loss The risks and benefits as well as alternatives of endoscopic procedure(s) have been discussed and reviewed. All questions answered. The patient agrees to proceed. EGD and colonoscopy unrevealing, may need small bowel video capsule to further evaluate  Recheck CBC, ferritin and iron studies If she continues to have low ferritin despite oral iron supplements, will consider IV iron  infusion  GERD with breakthrough heartburn on low-dose PPI: Omeprazole 40 mg twice daily before breakfast and dinner Antireflux measures  Return after procedures as needed    K. Denzil Magnuson , MD [phone] Mon-Fri 8a-5p [phone] after 5p, weekends, holidays  CC: Lucretia Kern, DO

## 2016-09-20 NOTE — Patient Instructions (Addendum)
You have been scheduled for an endoscopy and colonoscopy. Please follow the written instructions given to you at your visit today. Please pick up your prep supplies at the pharmacy within the next 1-3 days. If you use inhalers (even only as needed), please bring them with you on the day of your procedure. Your physician has requested that you go to www.startemmi.com and enter the access code given to you at your visit today. This web site gives a general overview about your procedure. However, you should still follow specific instructions given to you by our office regarding your preparation for the procedure.  Your physician has requested that you go to the basement for lab work before leaving today.  You may have a light breakfast the morning of prep day (the day before the procedure).   You may choose from one of the following items: eggs and toast OR chicken noodle soup and crackers.   You should have your breakfast completed between 8:00 and 9:00 am the day before your procedure.    After you have had your light breakfast you should start a clear liquid diet only, NO SOLIDS. No additional solid food is allowed. You may continue to have clear liquid up to 3 hours prior to your procedure.

## 2016-09-21 ENCOUNTER — Other Ambulatory Visit (INDEPENDENT_AMBULATORY_CARE_PROVIDER_SITE_OTHER): Payer: 59

## 2016-09-21 DIAGNOSIS — D509 Iron deficiency anemia, unspecified: Secondary | ICD-10-CM | POA: Diagnosis not present

## 2016-09-21 LAB — CBC WITH DIFFERENTIAL/PLATELET
BASOS PCT: 1.1 % (ref 0.0–3.0)
Basophils Absolute: 0 10*3/uL (ref 0.0–0.1)
EOS PCT: 4.6 % (ref 0.0–5.0)
Eosinophils Absolute: 0.2 10*3/uL (ref 0.0–0.7)
HEMATOCRIT: 39 % (ref 36.0–46.0)
Hemoglobin: 12.7 g/dL (ref 12.0–15.0)
LYMPHS PCT: 12.7 % (ref 12.0–46.0)
Lymphs Abs: 0.5 10*3/uL — ABNORMAL LOW (ref 0.7–4.0)
MCHC: 32.5 g/dL (ref 30.0–36.0)
MCV: 81.2 fl (ref 78.0–100.0)
MONOS PCT: 11 % (ref 3.0–12.0)
Monocytes Absolute: 0.4 10*3/uL (ref 0.1–1.0)
NEUTROS ABS: 2.6 10*3/uL (ref 1.4–7.7)
Neutrophils Relative %: 70.6 % (ref 43.0–77.0)
PLATELETS: 264 10*3/uL (ref 150.0–400.0)
RBC: 4.81 Mil/uL (ref 3.87–5.11)
RDW: 17 % — AB (ref 11.5–15.5)
WBC: 3.8 10*3/uL — ABNORMAL LOW (ref 4.0–10.5)

## 2016-09-27 ENCOUNTER — Other Ambulatory Visit (HOSPITAL_COMMUNITY): Payer: Self-pay | Admitting: *Deleted

## 2016-09-28 ENCOUNTER — Encounter (HOSPITAL_COMMUNITY)
Admission: RE | Admit: 2016-09-28 | Discharge: 2016-09-28 | Disposition: A | Discharge status: Home / Self Care | Payer: 59 | Source: Ambulatory Visit | Attending: Gastroenterology | Admitting: Gastroenterology

## 2016-09-28 DIAGNOSIS — D509 Iron deficiency anemia, unspecified: Secondary | ICD-10-CM | POA: Diagnosis not present

## 2016-09-28 MED ORDER — SODIUM CHLORIDE 0.9 % IV SOLN
510.0000 mg | Freq: Once | INTRAVENOUS | Status: AC
  Administered 2016-09-28: 510 mg via INTRAVENOUS
  Filled 2016-09-28: qty 17

## 2016-09-28 NOTE — Discharge Instructions (Signed)

## 2016-09-28 NOTE — Progress Notes (Signed)
Did sitting and standing VS on pt and they were WNL.  Pt stated she felt fine now and felt like she could walk out.  Pt left without complaint.

## 2016-09-28 NOTE — Progress Notes (Signed)
Called to room by patient.  She stated she felt hot and sweaty all over and dizzy.  Stopped infusion, put feet up, leaned recliner back, placed cool cloth on neck.  VS down from baseline.  Will continue to monitor

## 2016-09-28 NOTE — Progress Notes (Signed)
Called and spoke with Liliane Shi, PA at Dr Woodward Ku office and reported pt's Vs before infusion of feraheme, and 10 min into infusion VS as well as that the pt was hot, sweaty, and dizzy.  Reported that infusion was stopped, and pt feeling better now, and VS have returned to baseline.  Judson Roch stated as long as the pt was feeling well now it was ok to DC her home

## 2016-09-29 ENCOUNTER — Telehealth: Payer: Self-pay | Admitting: Gastroenterology

## 2016-09-29 NOTE — Telephone Encounter (Signed)
Advised of the recommendation. 

## 2016-09-29 NOTE — Telephone Encounter (Signed)
Yes please advise patient to keep follow up appt with Hematology and continue oral iron

## 2016-09-29 NOTE — Telephone Encounter (Signed)
Had an event yesterday before she completed her  Feraheme infusion.  Patient feels fine today. No residual symptoms and she is doing her normal daily activities.  Her questions are 1) does she need to keep the appointment with hematology? 2) does she continue to take the oral iron supplement?

## 2016-10-05 ENCOUNTER — Other Ambulatory Visit: Payer: Self-pay | Admitting: *Deleted

## 2016-10-05 MED ORDER — FLUOXETINE HCL 40 MG PO CAPS: 40.0000 mg | ORAL_CAPSULE | Freq: Every day | ORAL | 5 refills | Status: DC

## 2016-10-05 MED FILL — AMLODIPINE BESYLATE 5 MG TA: 5 | 90 days supply | Qty: 90 | Fill #1

## 2016-10-05 MED FILL — FLUoxetine HCL 40 MG CAPS: 40 | 30 days supply | Qty: 30 | Fill #0

## 2016-10-05 NOTE — Telephone Encounter (Signed)
Rx done. 

## 2016-10-07 NOTE — Progress Notes (Signed)
[name]    HEMATOLOGY/ONCOLOGY CLINIC NOTE  Date of Service: 10/07/2016  Patient Care Team: Lucretia Kern, DO as PCP - General (Family Medicine) [name], MD as Consulting Physician (Obstetrics and Gynecology)  CHIEF COMPLAINTS/PURPOSE OF CONSULTATION:  F/u for Leucopenia/lymphocytopenia  F/u for Iron deficiency Anemia  HISTORY OF PRESENTING ILLNESS:   Rachael Hodge is a wonderful 51 y.o. female who has been referred to [place] by Dr .Lucretia Kern, DO  for evaluation and management of leucopenia.  Patient has a history of hypertension, dyslipidemia, eczema, nummular dermatitis (follows up [facility] dermatology], GERD who was incidentally noted on her routine labs to have leukopenia and was referred to [place] for further evaluation.  Patient has had WBC counts between 2.9k to 3.7k from January 2018. She has not had any neutropenia but has been noted to have lymphopenia with lymphocyte counts ranging between 0.3-0.4k.  She has also been noted to have mild anemia with hemoglobin in the high 11 range and borderline microcytic MCV.  Patient was referred to [place] for further evaluation of her leukopenia.  Patient notes that she has previously been on steroids namely prednisone for about a week in March/ April of this year. She is on topical Elidel for her eczema/nummular dermatitis.  Also noted to have multiple other allergies on allergy testing in April 2018. Notes no other acute new medications. No significant use of over-the-counter NSAIDs.  No fevers no chills no night sweats no unexpected abnormal weight loss. Hepatitis C antibody test in January 2018 was negative.  INTERVAL HISTORY:   Pt presents to the office for f/u of her leucopenia and Iron deficiency Anemia. Her WBC are fairly stable with WBC count of 3.5k with no neutropenia, improved lymphopenia (0.4-->0.6k). She notes no issues with infections. She was on PO iron polysaccharide 131m po BID for 6-8 weeks with rpt  ferritin and iron profile showing no improvement. She was referred to GI and saw Dr KHarl [facility]and [facility] reactions including drop in BP and sweating 5 mins into the infusion which was discontinued. She continues to be on PPI which might be interfering with her Iron absorption. She has been scheduled for EGD/colonoscopy on 11/09/2016. We discussed trying IV Injectafer with adequate pre-medications vs continued po iron and after considering the risks vs benefits patient was agreeable to IV injectafer --she will be scheduled for 1 dose with premedications.    MEDICAL HISTORY:  Past Medical History:  Diagnosis Date  . Depression   . Eczema   . GERD (gastroesophageal reflux disease)   . Hyperlipidemia   . Hypertension   . Urticaria     SURGICAL HISTORY: Past Surgical History:  Procedure Laterality Date  . COLPOSCOPY  2006    SOCIAL HISTORY: Social History   Social History  . Marital status: Divorced    Spouse name: N/A  . Number of children: N/A  . Years of education: N/A   Occupational History  . Not on file.   Social History Main Topics  . Smoking status: Never Smoker  . Smokeless tobacco: Never Used  . Alcohol use 1.2 oz/week    2 Standard drinks or equivalent per week  . Drug use: No  . Sexual activity: Not on file   Other Topics Concern  . Not on file   Social History Narrative   Work or School: sResearch scientist (life sciences)      Home Situation:  lives with boyfriend      Spiritual Beliefs: Christian      Lifestyle: exercising, trying to eat well             FAMILY HISTORY: Family History  Problem Relation Age of Onset  . Heart disease Father   . Hyperlipidemia Father   . Hypertension Father   . Heart disease Paternal Grandfather   . Crohn's disease Son   . Breast cancer Neg Hx     ALLERGIES:  is allergic to penicillins; sulfa antibiotics; and feraheme  [ferumoxytol].  MEDICATIONS:  Current Outpatient Prescriptions  Medication Sig Dispense Refill  . amLODipine (NORVASC) 5 MG tablet TAKE 1 TABLET BY MOUTH DAILY. 90 tablet 1  . aspirin 81 MG tablet Take 81 mg by mouth daily.    Stasia Cavalier (EUCRISA) 2 % OINT Apply 1 application topically 2 (two) times daily. 60 g 3  . Fish Oil OIL 1,200 mg daily.     [name] FLUoxetine (PROZAC) 40 MG capsule Take 1 capsule (40 mg total) by mouth daily. 30 capsule 5  . hydrOXYzine (ATARAX/VISTARIL) 25 MG tablet Take 1 tablet at bedtime as needed for itching 30 tablet 3  . iron polysaccharides (NIFEREX) 150 MG capsule Take 150 mg by mouth daily.    . Multiple Vitamin (MULTIVITAMIN) capsule Take 1 capsule by mouth daily.    [name] omeprazole (PRILOSEC) 40 MG capsule Take 1 capsule (40 mg total) by mouth 2 (two) times daily before a meal. 180 capsule 3  . pimecrolimus (ELIDEL) 1 % cream Apply topically 2 (two) times daily. 30 g 3  . Probiotic Product (PROBIOTIC-10 PO) Take by mouth.    Manus Gunning BOWEL PREP KIT 17.5-3.13-1.6 GM/180ML SOLN Suprep-Use as directed 354 mL 0   No current facility-administered medications for this visit.     REVIEW OF SYSTEMS:    10 Point review of Systems was done is negative except as noted above.  PHYSICAL EXAMINATION:  ECOG PERFORMANCE STATUS: 0 - Asymptomatic  . Vitals:   10/08/16 1034  BP: 116/75  Pulse: 63  Resp: 18  Temp: 98.5 F (36.9 C)  SpO2: 100%   Filed Weights   10/08/16 1034  Weight: 146 lb (66.2 kg)   .Body mass index is 26.7 kg/m.  GENERAL:alert, in no acute distress and comfortable SKIN: no acute rashes, no significant lesions EYES: conjunctiva are pink and non-injected, sclera anicteric OROPHARYNX: MMM, no exudates, no oropharyngeal erythema or ulceration NECK: supple, no JVD LYMPH:  no palpable lymphadenopathy in the cervical, axillary or inguinal regions LUNGS: clear to auscultation b/l with normal respiratory effort HEART: regular rate &  rhythm ABDOMEN:  normoactive bowel sounds , non tender, not distended. No palpable hepatosplenomegaly. Extremity: no pedal edema PSYCH: alert & oriented x 3 with fluent speech NEURO: no focal motor/sensory deficits  LABORATORY DATA:  I have reviewed the data as listed  . CBC Latest Ref Rng & Units 10/08/2016 09/21/2016 07/08/2016  WBC 3.9 - 10.3 10e3/uL 3.5(L) 3.8(L) 4.0  Hemoglobin 11.6 - 15.9 g/dL 12.4 12.7 11.7  Hematocrit 34.8 - 46.6 % 37.7 39.0 36.2  Platelets 145 - 400 10e3/uL 245 264.0 259   . CBC    Component Value Date/Time   WBC 3.5 (L) 10/08/2016 1010   WBC 3.8 (L) 09/21/2016 1116   RBC 4.60 10/08/2016 1010   RBC 4.81 09/21/2016 1116   HGB 12.4 10/08/2016 1010   HCT 37.7 10/08/2016 1010   PLT 245 10/08/2016 1010   MCV 82.0 10/08/2016  1010   MCH 27.0 10/08/2016 1010   MCHC 32.9 10/08/2016 1010   MCHC 32.5 09/21/2016 1116   RDW 16.4 (H) 10/08/2016 1010   LYMPHSABS 0.6 (L) 10/08/2016 1010   MONOABS 0.2 10/08/2016 1010   EOSABS 0.2 10/08/2016 1010   BASOSABS 0.0 10/08/2016 1010    . CMP Latest Ref Rng & Units 10/08/2016 07/08/2016 07/08/2016  Glucose 70 - 140 mg/dl 85 93 -  BUN 7.0 - 26.0 mg/dL 13.4 19.5 -  Creatinine 0.6 - 1.1 mg/dL 0.9 0.9 -  Sodium 136 - 145 mEq/L 139 137 -  Potassium 3.5 - 5.1 mEq/L 4.4 4.3 -  Chloride 96 - 112 mEq/L - - -  CO2 22 - 29 mEq/L 27 25 -  Calcium 8.4 - 10.4 mg/dL 9.7 9.8 -  Total Protein 6.4 - 8.3 g/dL 7.2 7.7 7.0  Total Bilirubin 0.20 - 1.20 mg/dL 0.42 0.32 -  Alkaline Phos 40 - 150 U/L 117 126 -  AST 5 - 34 U/L 30 24 -  ALT 0 - 55 U/L 34 24 -   . Lab Results  Component Value Date   IRON 72 09/20/2016   TIBC 459 (H) 07/08/2016   IRONPCTSAT 14.1 (L) 09/20/2016   (Iron and TIBC)  Lab Results  Component Value Date   FERRITIN 6.3 (L) 09/20/2016    Component     Latest Ref Rng & Units 07/08/2016  IgG (Immunoglobin G), Serum     700 - 1,600 mg/dL 755  IgA/Immunoglobulin A, Serum     87 - 352 mg/dL 112  IgM, Qn, Serum      26 - 217 mg/dL 117  Total Protein     6.0 - 8.5 g/dL 7.0  Albumin SerPl Elph-Mcnc     2.9 - 4.4 g/dL 4.0  Alpha 1     0.0 - 0.4 g/dL 0.2  Alpha2 Glob SerPl Elph-Mcnc     0.4 - 1.0 g/dL 0.7  B-Globulin SerPl Elph-Mcnc     0.7 - 1.3 g/dL 1.2  Gamma Glob SerPl Elph-Mcnc     0.4 - 1.8 g/dL 0.9  M Protein SerPl Elph-Mcnc     Not Observed g/dL Not Observed  Globulin, Total     2.2 - 3.9 g/dL 3.0  Albumin/Glob SerPl     0.7 - 1.7 1.4  IFE 1      Comment  Please Note (HCV):      Comment  Iron     41 - 142 ug/dL 63  TIBC     236 - 444 ug/dL 459 (H)  UIBC     120 - 384 ug/dL 396 (H)  %SAT     21 - 57 % 14 (L)  Folate, Hemolysate     Not Estab. ng/mL 306.4  HCT     34.0 - 46.6 % 36.5  Folate, RBC     >498 ng/mL 839  LDH     125 - 245 U/L 191  Sed Rate     0 - 40 mm/hr 7  CRP     0.0 - 4.9 mg/L 2.5  Tryptase     2.2 - 13.2 ug/L 3.9  ANA Ab, IFA      Negative  Zinc     56 - 134 ug/dL 84  Ferritin     9 - 269 ng/ml 6 (L)  Vitamin B12     232 - 1,245 pg/mL 505     RADIOGRAPHIC STUDIES: I have personally reviewed the radiological images as  listed and agreed with the findings in the report. No results found.  ASSESSMENT & PLAN:   51 year old female with  #1 Leukopenia at least from January 2018. Total WBC counts elevated from 2.9-3.7k. Repeat labs today show resolution of her leukopenia with a WBC count of 3.5k. No neutropenia.   #2 lymphopenia -this has been the primary componenent of her leukopenia. Labs today show normalization of total WBC count at 4k. She still shows signs of lymphopenia with lymphocyte count of 0.4k. Patient has not had any issues with increased infections. No constitutional symptoms. Patient has been on steroids for her skin condition which can cause some lymphopenia. Lymphopenia could also be associated with her allergies/skin condition. Imprboved Hepatitis C testing was negative. ANA negative Vitamin B12 and folate within normal  limits LDH is within normal limits and patient has no clinically evident lymphadenopathy or hepatosplenomegaly to suggest a lymphoproliferative process. Tryptase levels within normal limits Sedimentation rate and CRP within normal limits. Myeloma panel showed no monoclonal gammopathy ?subclinical viral infection. Plan -lab results were discussed with the patient. -No indication for additional workup for bone marrow biopsy at this time. -no other clinical or lab features concerning for a primary lymphoproliferative syndrome or bone marrow pathology at this time.  #3 microcytic anemia -due to iron deficiency. . Lab Results  Component Value Date   IRON 72 09/20/2016   TIBC 459 (H) 07/08/2016   IRONPCTSAT 14.1 (L) 09/20/2016   (Iron and TIBC)  Lab Results  Component Value Date   FERRITIN 6.3 (L) 09/20/2016   Patient notes no overt bleeding. Patient's ferritin and iron levels are unchanged after 6-8 weeks of PO iron replacement. ?absorption issue due to chronic PPI use. Has been evaluation by GI/Dr Harl Bowie - pending GI workup #4 Allergic/Hypersensitivity reaction to IV Feraheme. Plan -after discussing pros vs cons of IV Injectafer -patient is agreeable to proceed with adequate premedications (Ordered solumedrol, tylenol, benadryl, famotidine for pre-medications considering previous allerigc reaction to feraheme). Will need close monitoring. -Follow-up with primary care physician and GI/Dr Harl Bowie for GI workup as per plan.    IV Injectafer 734m IVPB x 1 dose in 1 week RTC with Dr KIrene Limboin 2 months with labs   All of the patients questions were answered with apparent satisfaction. The patient knows to call the clinic with any problems, questions or concerns.  I spent 20 minutes counseling the patient face to face. The total time spent in the appointment was 25 minutes and more than 50% was on counseling and direct patient cares.    GSullivan LoneMD MPortlandAAHIVMS SKa[facility] CKaiser Permanente Baldwin Park Medical CenterHematology/Oncology Physician CWillamette Surgery [facility] LLC (Office):       [phone](Work cell):  [phone](Fax):           [phone] 10/07/2016 8:05 PM   This document serves as a record of services personally performed by GSullivan Lone MD. It was created on her behalf by SSteva Colder a trained medical scribe. The creation of this record is based on the scribe's personal observations and the provider's statements to them. This document has been checked and approved by the attending provider.

## 2016-10-08 ENCOUNTER — Other Ambulatory Visit (HOSPITAL_BASED_OUTPATIENT_CLINIC_OR_DEPARTMENT_OTHER): Payer: 59

## 2016-10-08 ENCOUNTER — Ambulatory Visit (HOSPITAL_BASED_OUTPATIENT_CLINIC_OR_DEPARTMENT_OTHER): Payer: 59 | Admitting: Hematology

## 2016-10-08 VITALS — BP 116/75 | HR 63 | Temp 98.5°F | Resp 18 | Wt 146.0 lb

## 2016-10-08 DIAGNOSIS — D72819 Decreased white blood cell count, unspecified: Secondary | ICD-10-CM

## 2016-10-08 DIAGNOSIS — D509 Iron deficiency anemia, unspecified: Secondary | ICD-10-CM | POA: Diagnosis not present

## 2016-10-08 DIAGNOSIS — D72810 Lymphocytopenia: Secondary | ICD-10-CM

## 2016-10-08 DIAGNOSIS — D508 Other iron deficiency anemias: Secondary | ICD-10-CM

## 2016-10-08 LAB — COMPREHENSIVE METABOLIC PANEL
ALT: 34 U/L (ref 0–55)
AST: 30 U/L (ref 5–34)
Albumin: 4.1 g/dL (ref 3.5–5.0)
Alkaline Phosphatase: 117 U/L (ref 40–150)
Anion Gap: 9 mEq/L (ref 3–11)
BUN: 13.4 mg/dL (ref 7.0–26.0)
CHLORIDE: 103 meq/L (ref 98–109)
CO2: 27 mEq/L (ref 22–29)
Calcium: 9.7 mg/dL (ref 8.4–10.4)
Creatinine: 0.9 mg/dL (ref 0.6–1.1)
EGFR: 71 mL/min/{1.73_m2} — ABNORMAL LOW (ref >90)
GLUCOSE: 85 mg/dL (ref 70–140)
POTASSIUM: 4.4 meq/L (ref 3.5–5.1)
SODIUM: 139 meq/L (ref 136–145)
Total Bilirubin: 0.42 mg/dL (ref 0.20–1.20)
Total Protein: 7.2 g/dL (ref 6.4–8.3)

## 2016-10-08 LAB — CBC & DIFF AND RETIC
BASO%: 0.6 % (ref 0.0–2.0)
Basophils Absolute: 0 10*3/uL (ref 0.0–0.1)
EOS ABS: 0.2 10*3/uL (ref 0.0–0.5)
EOS%: 4.6 % (ref 0.0–7.0)
HCT: 37.7 % (ref 34.8–46.6)
HEMOGLOBIN: 12.4 g/dL (ref 11.6–15.9)
Immature Retic Fract: 3.3 % (ref 1.60–10.00)
LYMPH#: 0.6 10*3/uL — AB (ref 0.9–3.3)
LYMPH%: 16.1 % (ref 14.0–49.7)
MCH: 27 pg (ref 25.1–34.0)
MCHC: 32.9 g/dL (ref 31.5–36.0)
MCV: 82 fL (ref 79.5–101.0)
MONO#: 0.2 10*3/uL (ref 0.1–0.9)
MONO%: 4.9 % (ref 0.0–14.0)
NEUT%: 73.8 % (ref 38.4–76.8)
NEUTROS ABS: 2.6 10*3/uL (ref 1.5–6.5)
Platelets: 245 10*3/uL (ref 145–400)
RBC: 4.6 10*6/uL (ref 3.70–5.45)
RDW: 16.4 % — AB (ref 11.2–14.5)
RETIC %: 1.09 % (ref 0.70–2.10)
RETIC CT ABS: 50.14 10*3/uL (ref 33.70–90.70)
WBC: 3.5 10*3/uL — AB (ref 3.9–10.3)

## 2016-10-08 NOTE — Patient Instructions (Signed)
Thank you for choosing [facility] to provide your oncology and hematology care.  To afford each patient quality time with our providers, please arrive 30 minutes before your scheduled appointment time.  If you arrive late for your appointment, you may be asked to reschedule.  We strive to give you quality time with our providers, and arriving late affects you and other patients whose appointments are after yours.   If you are a no show for multiple scheduled visits, you may be dismissed from the clinic at the providers discretion.    Again, thank you for choosing [facility], our hope is that these requests will decrease the amount of time that you wait before being seen by our physicians.  ______________________________________________________________________  Should you have questions after your visit to the [facility], please contact our office at [phone] between the hours of 8:30 and 4:30 p.m.    Voicemails left after 4:30p.m will not be returned until the following business day.    For prescription refill requests, please have your pharmacy contact us directly.  Please also try to allow 48 hours for prescription requests.    Please contact the scheduling department for questions regarding scheduling.  For scheduling of procedures such as PET scans, CT scans, MRI, Ultrasound, etc please contact central scheduling at [phone].    Resources For Cancer Patients and Caregivers:   Oncolink.org:  A wonderful resource for patients and healthcare providers for information regarding your disease, ways to tract your treatment, what to expect, etc.     American Cancer Society:  800-227-2345  Can help patients locate various types of support and financial assistance  Cancer Care: 1-800-813-HOPE (4673) Provides financial assistance, online support groups, medication/co-pay assistance.    [place] DSS:  336-641-3447 Where to apply for food  stamps, Medicaid, and utility assistance  Medicare Rights Center: 800-333-4114 Helps people with Medicare understand their rights and benefits, navigate the Medicare system, and secure the quality healthcare they deserve  SCAT: 336-333-6589 [place] [employer]'s shared-ride transportation service for eligible riders who have a disability that prevents them from riding the fixed route bus.    For additional information on assistance programs please contact our social worker:   Grier Hock/Abigail Elmore:  [phone]            

## 2016-10-18 ENCOUNTER — Ambulatory Visit: Payer: 59

## 2016-10-19 ENCOUNTER — Ambulatory Visit: Payer: 59

## 2016-10-21 ENCOUNTER — Encounter: Payer: Self-pay | Admitting: Family Medicine

## 2016-10-25 ENCOUNTER — Telehealth: Payer: Self-pay | Admitting: Family Medicine

## 2016-10-25 ENCOUNTER — Telehealth: Payer: Self-pay | Admitting: Gastroenterology

## 2016-10-25 MED ORDER — FLUOXETINE HCL 40 MG PO CAPS: 40.0000 mg | ORAL_CAPSULE | Freq: Every day | ORAL | 0 refills | Status: DC

## 2016-10-25 MED ORDER — AMLODIPINE BESYLATE 5 MG PO TABS: 5.0000 mg | ORAL_TABLET | Freq: Every day | ORAL | 0 refills | Status: DC

## 2016-10-25 MED ORDER — OMEPRAZOLE 40 MG PO CPDR: 40.0000 mg | DELAYED_RELEASE_CAPSULE | Freq: Two times a day (BID) | ORAL | 3 refills | Status: DC

## 2016-10-25 NOTE — Telephone Encounter (Signed)
Patient states her dad has a massive heart attack and she had to go to [place]. Pt did not bring enough medication and needs refill of omeprazole sent to pharmacy in [place]. I put pharmacy in demographics.

## 2016-10-25 NOTE — Telephone Encounter (Signed)
Rx done. 

## 2016-10-25 NOTE — Telephone Encounter (Signed)
Pt is in [place] taking care of her dad who had a stroke. Pt needs new rxs amlodipine 5 mg and fluoxetine 40 mg#30 each [facility] [phone] [facility]

## 2016-10-25 NOTE — Telephone Encounter (Signed)
Called and left message for patient that I sent in omeprazole to the [facility] as requested

## 2016-11-02 ENCOUNTER — Encounter: Payer: 59 | Admitting: Gastroenterology

## 2016-11-23 ENCOUNTER — Other Ambulatory Visit: Payer: Self-pay | Admitting: Family Medicine

## 2016-12-02 ENCOUNTER — Ambulatory Visit: Payer: 59 | Admitting: Gastroenterology

## 2016-12-09 ENCOUNTER — Ambulatory Visit: Payer: 59 | Admitting: Hematology

## 2016-12-09 ENCOUNTER — Other Ambulatory Visit: Payer: 59

## 2016-12-13 ENCOUNTER — Ambulatory Visit: Payer: 59 | Admitting: Family Medicine

## 2017-01-05 ENCOUNTER — Other Ambulatory Visit: Payer: Self-pay | Admitting: Family Medicine

## 2017-01-05 MED FILL — FLUoxetine HCL 40 MG CAPS: 40 | 30 days supply | Qty: 30 | Fill #1

## 2017-01-06 ENCOUNTER — Encounter: Payer: Self-pay | Admitting: Obstetrics and Gynecology

## 2017-01-06 MED FILL — AMLODIPINE BESYLATE 5 MG TA: 5 | 90 days supply | Qty: 90 | Fill #0

## 2017-01-07 ENCOUNTER — Telehealth: Payer: Self-pay | Admitting: *Deleted

## 2017-01-07 NOTE — Telephone Encounter (Signed)
See telephone encounter dated 01/07/17 to review with provider.

## 2017-01-07 NOTE — Telephone Encounter (Signed)
Spoke with patient, advised as seen below per Dr. Jertson. Patient verbalizes understanding and is agreeable. Will close encounter.  

## 2017-01-07 NOTE — Telephone Encounter (Signed)
Dr. Talbert Nan  -please advise on gardasil over 53 yo?   From Adele Dan To [name], MD Sent 01/06/2017 12:58 PM  Would I be a candidate to receive the HPV Vaccine?  Last January I tested positive for the virus.  Thank you!

## 2017-01-07 NOTE — Telephone Encounter (Signed)
Please inform the patient that the gardasil has just recently been approved for women up until 38, unfortunately she falls outside of the guidline. It won't get rid of any virus that she already has.

## 2017-02-04 ENCOUNTER — Encounter (INDEPENDENT_AMBULATORY_CARE_PROVIDER_SITE_OTHER): Payer: Self-pay

## 2017-02-04 ENCOUNTER — Encounter: Payer: Self-pay | Admitting: Gastroenterology

## 2017-02-07 ENCOUNTER — Encounter: Payer: Self-pay | Admitting: Gastroenterology

## 2017-02-07 MED FILL — FLUoxetine HCL 40 MG CAPS: 40 | 30 days supply | Qty: 30 | Fill #2

## 2017-02-08 ENCOUNTER — Ambulatory Visit: Payer: 59 | Admitting: Family Medicine

## 2017-02-10 ENCOUNTER — Encounter: Payer: Self-pay | Admitting: Family Medicine

## 2017-02-11 ENCOUNTER — Encounter: Payer: Self-pay | Admitting: *Deleted

## 2017-02-12 NOTE — Progress Notes (Signed)
HPI:  Rachael Hodge is a pleasant 52 y.o. here for follow up. Chronic medical problems summarized below were reviewed for changes and stability and were updated as needed below. These issues and their treatment remain stable for the most part. She has had a bit of stress as father had a stroke and she is the only child. Doing ok now. Reports seeing GI and hematology for the iron def anemia and had to postpone GI eval due to situation with her father. Reports now rescheduled. Asks that we check ferritin with labs and fax to Dr. Irene Limbo. Denies CP, SOB, DOE, treatment intolerance or new symptoms.  Depression: -on prozac 71m daily -reports: doing well on the medication  HTN: -meds: amlodipine  Prediabetes/HLD: -has made significant lifestyle changes, is happy with progress -getting regular exercise (jazzercise) and eating no simple carb diet -QRISK2 2.9; she has opted against cholesterol lowering medication and prefers to work on lifestyle changes alone  Leukopenia/iron def anemia: -s/p evaluation with hematology in 2018, felt lymphonpenia related to allergies or virus per notes, no further eval advised -reports scheduled with GI for eval anemia - 2019 -seeing Dr. KIrene Limboabout the iron def anemia   ROS: See pertinent positives and negatives per HPI.  Past Medical History:  Diagnosis Date  . Depression   . Eczema   . GERD (gastroesophageal reflux disease)   . Hyperlipidemia   . Hypertension   . Urticaria     Past Surgical History:  Procedure Laterality Date  . COLPOSCOPY  2006    Family History  Problem Relation Age of Onset  . Heart disease Father   . Hyperlipidemia Father   . Hypertension Father   . Heart disease Paternal Grandfather   . Crohn's disease Son   . Breast cancer Neg Hx     Social History   Socioeconomic History  . Marital status: Married    Spouse name: None  . Number of children: None  . Years of education: None  . Highest education level: None   Social Needs  . Financial resource strain: None  . Food insecurity - worry: None  . Food insecurity - inability: None  . Transportation needs - medical: None  . Transportation needs - non-medical: None  Occupational History  . None  Tobacco Use  . Smoking status: Never Smoker  . Smokeless tobacco: Never Used  Substance and Sexual Activity  . Alcohol use: Yes    Alcohol/week: 1.2 oz    Types: 2 Standard drinks or equivalent per week  . Drug use: No  . Sexual activity: None  Other Topics Concern  . 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  amLODipine (NORVASC) 5 MG tablet, TAKE 1 TABLET BY MOUTH DAILY., Disp: 90 tablet, Rfl: 1 .  aspirin 81 MG tablet, Take 81 mg by mouth daily., Disp: , Rfl:  .  Crisaborole (EUCRISA) 2 % OINT, Apply 1 application topically 2 (two) times daily., Disp: 60 g, Rfl: 3 .  Fish Oil OIL, 1,200 mg daily. , Disp: , Rfl:  .  FLUoxetine (PROZAC) 40 MG capsule, TAKE 1 CAPSULE BY MOUTH ONE TIME A DAY , Disp: 30 capsule, Rfl: 5 .  hydrOXYzine (ATARAX/VISTARIL) 25 MG tablet, Take 1 tablet at bedtime as needed for itching, Disp: 30 tablet, Rfl: 3 .  Multiple Vitamin (MULTIVITAMIN) capsule, Take 1 capsule by mouth daily., Disp: , Rfl:  .  omeprazole (PRILOSEC) 40 MG capsule, Take 1 capsule (40 mg total) by mouth 2 (two) times daily before a meal., Disp: 180 capsule, Rfl: 3 .  pimecrolimus (ELIDEL) 1 % cream, Apply topically 2 (two) times daily., Disp: 30 g, Rfl: 3 .  Probiotic Product (PROBIOTIC-10 PO), Take by mouth., Disp: , Rfl:  .  SUPREP BOWEL PREP KIT 17.5-3.13-1.6 GM/180ML SOLN, Suprep-Use as directed, Disp: 354 mL, Rfl: 0  EXAM:  Vitals:   02/14/17 0723  BP: 102/70  Pulse: 90  Temp: 98.5 F (36.9 C)    Body mass index  is 28.77 kg/m.  GENERAL: vitals reviewed and listed above, alert, oriented, appears well hydrated and in no acute distress  HEENT: atraumatic, conjunttiva clear, no obvious abnormalities on inspection of external nose and ears  NECK: no obvious masses on inspection  LUNGS: clear to auscultation bilaterally, no wheezes, rales or rhonchi, good air movement  CV: HRRR, no peripheral edema  MS: moves all extremities without noticeable abnormality  PSYCH: pleasant and cooperative, no obvious depression or anxiety  ASSESSMENT AND PLAN:  Discussed the following assessment and plan:  Major depressive disorder with single episode, in full remission ([place])  Essential hypertension - Plan: Basic metabolic panel, CBC  Hyperlipidemia, unspecified hyperlipidemia type - Plan: Lipid panel  Other iron deficiency anemia - Plan: Ferritin  Hyperglycemia - Plan: Hemoglobin A1c  -labs today -lifestyle recs -PHQ9 ok, continue SSRI -she plans to see GI and hematology for further follow up/eval iron def anemia -follow up 3-4 months  Patient Instructions  BEFORE YOU LEAVE: -labs -follow up: 3-4 months  Hang in there!  We have ordered labs or studies at this visit. It can take up to 1-2 weeks for results and processing. IF results require follow up or explanation, we will call you with instructions. Clinically stable results will be released to your [facility]. If you have not heard from [place] or cannot find your results in [facility] in 2 weeks please contact our office at [phone].  If you are not yet signed up for [facility], please consider signing up.  See the gastroenterologist and hematologist about the anemia.   We recommend the following healthy lifestyle for LIFE: 1) Small portions. But, make sure to get regular (at least 3 per day), healthy meals and small healthy snacks if needed.  2) Eat a healthy clean diet.   TRY TO EAT: -at least 5-7 servings of low sugar, colorful, and nutrient  rich vegetables per day (not corn, potatoes or bananas.) -berries are the best choice if you wish to eat fruit (only eat small amounts if trying to reduce weight)  -lean meets (fish, white meat of chicken or [place]) -vegan proteins for some meals - beans or tofu, whole grains, nuts and seeds -Replace bad fats with good fats - good fats include: fish, nuts and seeds, canola oil, olive oil -small amounts of low fat or non fat dairy -small amounts of100 % whole grains - check the lables -drink plenty of water  AVOID: -SUGAR, sweets, anything with added sugar, corn syrup or sweeteners - must read labels as even foods advertised as "healthy" often are loaded with sugar -if you must have a sweetener, small amounts of stevia may be best -sweetened beverages and artificially sweetened beverages -simple starches (  rice, bread, potatoes, pasta, chips, etc - small amounts of 100% whole grains are ok) -red meat, pork, butter -fried foods, fast food, processed food, excessive dairy, eggs and coconut.  3)Get at least 150 minutes of sweaty aerobic exercise per week.  4)Reduce stress - consider counseling, meditation and relaxation to balance other aspects of your life.    Colin Benton R., DO

## 2017-02-14 ENCOUNTER — Encounter: Payer: Self-pay | Admitting: Family Medicine

## 2017-02-14 ENCOUNTER — Ambulatory Visit (INDEPENDENT_AMBULATORY_CARE_PROVIDER_SITE_OTHER): Payer: 59 | Admitting: Family Medicine

## 2017-02-14 VITALS — BP 102/70 | HR 90 | Temp 98.5°F | Ht 62.0 in | Wt 157.3 lb

## 2017-02-14 DIAGNOSIS — I10 Essential (primary) hypertension: Secondary | ICD-10-CM | POA: Diagnosis not present

## 2017-02-14 DIAGNOSIS — F325 Major depressive disorder, single episode, in full remission: Secondary | ICD-10-CM

## 2017-02-14 DIAGNOSIS — D508 Other iron deficiency anemias: Secondary | ICD-10-CM

## 2017-02-14 DIAGNOSIS — R739 Hyperglycemia, unspecified: Secondary | ICD-10-CM | POA: Diagnosis not present

## 2017-02-14 DIAGNOSIS — E785 Hyperlipidemia, unspecified: Secondary | ICD-10-CM | POA: Diagnosis not present

## 2017-02-14 LAB — BASIC METABOLIC PANEL
BUN: 10 mg/dL (ref 6–23)
CHLORIDE: 102 meq/L (ref 96–112)
CO2: 29 meq/L (ref 19–32)
Calcium: 9.2 mg/dL (ref 8.4–10.5)
Creatinine, Ser: 0.85 mg/dL (ref 0.40–1.20)
GFR: 74.07 mL/min (ref >60.00)
Glucose, Bld: 97 mg/dL (ref 70–99)
POTASSIUM: 4.1 meq/L (ref 3.5–5.1)
SODIUM: 141 meq/L (ref 135–145)

## 2017-02-14 LAB — CBC
HCT: 39.6 % (ref 36.0–46.0)
HEMOGLOBIN: 12.9 g/dL (ref 12.0–15.0)
MCHC: 32.5 g/dL (ref 30.0–36.0)
MCV: 86.8 fl (ref 78.0–100.0)
Platelets: 270 10*3/uL (ref 150.0–400.0)
RBC: 4.56 Mil/uL (ref 3.87–5.11)
RDW: 14.4 % (ref 11.5–15.5)
WBC: 5 10*3/uL (ref 4.0–10.5)

## 2017-02-14 LAB — LIPID PANEL
CHOL/HDL RATIO: 3
CHOLESTEROL: 223 mg/dL — AB (ref 0–200)
HDL: 75.5 mg/dL (ref >39.00)
LDL CALC: 127 mg/dL — AB (ref 0–99)
NonHDL: 147.13
Triglycerides: 99 mg/dL (ref 0.0–149.0)
VLDL: 19.8 mg/dL (ref 0.0–40.0)

## 2017-02-14 LAB — FERRITIN: FERRITIN: 42.8 ng/mL (ref 10.0–291.0)

## 2017-02-14 LAB — HEMOGLOBIN A1C: Hgb A1c MFr Bld: 6.1 % (ref 4.6–6.5)

## 2017-02-14 NOTE — Patient Instructions (Signed)
BEFORE YOU LEAVE: -labs -follow up: 3-4 months  Hang in there!  We have ordered labs or studies at this visit. It can take up to 1-2 weeks for results and processing. IF results require follow up or explanation, we will call you with instructions. Clinically stable results will be released to your [facility]. If you have not heard from [place] or cannot find your results in [facility] in 2 weeks please contact our office at [phone].  If you are not yet signed up for [facility], please consider signing up.  See the gastroenterologist and hematologist about the anemia.   We recommend the following healthy lifestyle for LIFE: 1) Small portions. But, make sure to get regular (at least 3 per day), healthy meals and small healthy snacks if needed.  2) Eat a healthy clean diet.   TRY TO EAT: -at least 5-7 servings of low sugar, colorful, and nutrient rich vegetables per day (not corn, potatoes or bananas.) -berries are the best choice if you wish to eat fruit (only eat small amounts if trying to reduce weight)  -lean meets (fish, white meat of chicken or [place]) -vegan proteins for some meals - beans or tofu, whole grains, nuts and seeds -Replace bad fats with good fats - good fats include: fish, nuts and seeds, canola oil, olive oil -small amounts of low fat or non fat dairy -small amounts of100 % whole grains - check the lables -drink plenty of water  AVOID: -SUGAR, sweets, anything with added sugar, corn syrup or sweeteners - must read labels as even foods advertised as "healthy" often are loaded with sugar -if you must have a sweetener, small amounts of stevia may be best -sweetened beverages and artificially sweetened beverages -simple starches (rice, bread, potatoes, pasta, chips, etc - small amounts of 100% whole grains are ok) -red meat, pork, butter -fried foods, fast food, processed food, excessive dairy, eggs and coconut.  3)Get at least 150 minutes of sweaty aerobic exercise per  week.  4)Reduce stress - consider counseling, meditation and relaxation to balance other aspects of your life.

## 2017-02-15 ENCOUNTER — Telehealth: Payer: Self-pay | Admitting: Family Medicine

## 2017-02-15 NOTE — Telephone Encounter (Signed)
Spoke w/ Aldona Bar. She is able to view results on Epic and does not need them faxed.

## 2017-02-15 NOTE — Telephone Encounter (Signed)
Copied from [place] [id number]. Topic: Quick Communication - See Telephone Encounter >> Feb 15, 2017 11:56 AM Burnis Medin, NT wrote: CRM for notification. See Telephone encounter for: Aldona Bar is calling to see if someone could fax over lab results from yesterday if they are in. Pls fax results to  [phone]  02/15/17.

## 2017-02-16 ENCOUNTER — Telehealth: Payer: Self-pay | Admitting: Hematology

## 2017-02-16 NOTE — Telephone Encounter (Signed)
Called regarding 12/9 °

## 2017-02-24 NOTE — Progress Notes (Signed)
[name]    HEMATOLOGY/ONCOLOGY CLINIC NOTE  Date of Service: 03/01/2017  Patient Care Team: Lucretia Kern, DO as PCP - General (Family Medicine) [name], MD as Consulting Physician (Obstetrics and Gynecology)  CHIEF COMPLAINTS/PURPOSE OF CONSULTATION:  F/u for Leucopenia/lymphocytopenia  F/u for Iron deficiency Anemia  HISTORY OF PRESENTING ILLNESS:   Rachael Hodge is a wonderful 52 y.o. female who has been referred to [place] by Dr .Lucretia Kern, DO  for evaluation and management of leucopenia.  Patient has a history of hypertension, dyslipidemia, eczema, nummular dermatitis (follows up [facility] dermatology], GERD who was incidentally noted on her routine labs to have leukopenia and was referred to [place] for further evaluation.  Patient has had WBC counts between 2.9k to 3.7k from January 2018. She has not had any neutropenia but has been noted to have lymphopenia with lymphocyte counts ranging between 0.3-0.4k.  She has also been noted to have mild anemia with hemoglobin in the high 11 range and borderline microcytic MCV.  Patient was referred to [place] for further evaluation of her leukopenia.  Patient notes that she has previously been on steroids namely prednisone for about a week in March/ April of this year. She is on topical Elidel for her eczema/nummular dermatitis.  Also noted to have multiple other allergies on allergy testing in April 2018. Notes no other acute new medications. No significant use of over-the-counter NSAIDs.  No fevers no chills no night sweats no unexpected abnormal weight loss. Hepatitis C antibody test in January 2018 was negative.  INTERVAL HISTORY:   Rachael Hodge presents to the office for f/u of her leucopenia and Iron deficiency Anemia.   She notes her father had a massive stroke and she was out of work on [employer] to take care of him in [place]. He has now moved to [facility] in [place] so he can be close by. She has help  with enrollment in  hospice and palliative care and knows he is in decline.  She notes she is doing well considering her family issues. She notes when she saw her PCP previously her ferritin was 46 according to her. She notes she did see Dr. Silverio Decamp, she did not have her colonoscopy/EGD given her family issues. She is scheduled for colonoscopy and upper endoscopy in 04/2017. Dr. Silverio Decamp replaced her Nexium with Prilosec and hopes to ween her off after GI workup. She has not been taking any oral iron. She notes she started her menopause at 52 yo.   On review of symptoms, pt notes she has gained weight and still has significant acid reflux. She denies any bleeding, bloody or black stool or abdominal pain. She has not had a period since starting her menopause at 52 yo.      MEDICAL HISTORY:  Past Medical History:  Diagnosis Date  . Depression   . Eczema   . GERD (gastroesophageal reflux disease)   . Hyperlipidemia   . Hypertension   . Urticaria     SURGICAL HISTORY: Past Surgical History:  Procedure Laterality Date  . COLPOSCOPY  2006    SOCIAL HISTORY: Social History   Socioeconomic History  . Marital status: Married    Spouse name: Not on file  . Number of children: Not on file  . Years of education: Not on file  . Highest education level: Not on file  Social Needs  . Financial resource strain: Not on file  . Food insecurity - worry: Not on file  .  Food insecurity - inability: Not on file  . Transportation needs - medical: Not on file  . Transportation needs - non-medical: Not on file  Occupational History  . Not on file  Tobacco Use  . Smoking status: Never Smoker  . Smokeless tobacco: Never Used  Substance and Sexual Activity  . Alcohol use: Yes    Alcohol/week: 1.2 oz    Types: 2 Standard drinks or equivalent per week  . Drug use: No  . Sexual activity: Not on file  Other Topics Concern  . Not on file  Social History Narrative   Work or School: Electrical engineer Situation: lives with boyfriend      Spiritual Beliefs: Christian      Lifestyle: exercising, trying to eat well             FAMILY HISTORY: Family History  Problem Relation Age of Onset  . Heart disease Father   . Hyperlipidemia Father   . Hypertension Father   . Heart disease Paternal Grandfather   . Crohn's disease Son   . Breast cancer Neg Hx     ALLERGIES:  is allergic to penicillins; sulfa antibiotics; and feraheme [ferumoxytol].  MEDICATIONS:  Current Outpatient Medications  Medication Sig Dispense Refill  . amLODipine (NORVASC) 5 MG tablet TAKE 1 TABLET BY MOUTH DAILY. 90 tablet 1  . aspirin 81 MG tablet Take 81 mg by mouth daily.    Stasia Cavalier (EUCRISA) 2 % OINT Apply 1 application topically 2 (two) times daily. 60 g 3  . Fish Oil OIL 1,200 mg daily.     [name] FLUoxetine (PROZAC) 40 MG capsule TAKE 1 CAPSULE BY MOUTH ONE TIME A DAY  30 capsule 5  . hydrOXYzine (ATARAX/VISTARIL) 25 MG tablet Take 1 tablet at bedtime as needed for itching 30 tablet 3  . Multiple Vitamin (MULTIVITAMIN) capsule Take 1 capsule by mouth daily.    [name] omeprazole (PRILOSEC) 40 MG capsule Take 1 capsule (40 mg total) by mouth 2 (two) times daily before a meal. 180 capsule 3  . pimecrolimus (ELIDEL) 1 % cream Apply topically 2 (two) times daily. 30 g 3  . Probiotic Product (PROBIOTIC-10 PO) Take by mouth.    Manus Gunning BOWEL PREP KIT 17.5-3.13-1.6 GM/180ML SOLN Suprep-Use as directed 354 mL 0   No current facility-administered medications for this visit.     REVIEW OF SYSTEMS:    10 Point review of Systems was done is negative except as noted above.  PHYSICAL EXAMINATION:  ECOG PERFORMANCE STATUS: 0 - Asymptomatic  . Vitals:   03/01/17 0936  BP: (!) 152/74  Pulse: 73  Resp: 18  Temp: 98.1 F (36.7 C)  SpO2: 98%   Filed Weights   03/01/17 0936  Weight: 158 lb 6.4 oz (71.8 kg)   .Body mass index is 28.97 kg/m.  GENERAL:alert, in  no acute distress and comfortable SKIN: no acute rashes, no significant lesions EYES: conjunctiva are pink and non-injected, sclera anicteric OROPHARYNX: MMM, no exudates, no oropharyngeal erythema or ulceration NECK: supple, no JVD LYMPH:  no palpable lymphadenopathy in the cervical, axillary or inguinal regions LUNGS: clear to auscultation b/l with normal respiratory effort HEART: regular rate & rhythm ABDOMEN:  normoactive bowel sounds , non tender, not distended. No palpable hepatosplenomegaly. Extremity: no pedal edema PSYCH: alert & oriented x 3 with fluent speech NEURO: no focal motor/sensory deficits  LABORATORY DATA:  I have reviewed the data as listed  .  CBC Latest Ref Rng & Units 03/01/2017 02/14/2017 10/08/2016  WBC 3.9 - 10.3 K/uL 4.1 5.0 3.5(L)  Hemoglobin 12.0 - 15.0 g/dL - 12.9 12.4  Hematocrit 34.8 - 46.6 % 38.7 39.6 37.7  Platelets 145 - 400 K/uL 272 270.0 245  ANC 3.3k . CBC    Component Value Date/Time   WBC 4.1 03/01/2017 0916   WBC 5.0 02/14/2017 0752   RBC 4.60 03/01/2017 0916   HGB 12.9 02/14/2017 0752   HGB 12.4 10/08/2016 1010   HCT 38.7 03/01/2017 0916   HCT 37.7 10/08/2016 1010   PLT 272 03/01/2017 0916   PLT 245 10/08/2016 1010   MCV 84.2 03/01/2017 0916   MCV 82.0 10/08/2016 1010   MCH 27.5 03/01/2017 0916   MCHC 32.7 03/01/2017 0916   RDW 14.0 03/01/2017 0916   RDW 16.4 (H) 10/08/2016 1010   LYMPHSABS 0.3 (L) 03/01/2017 0916   LYMPHSABS 0.6 (L) 10/08/2016 1010   MONOABS 0.3 03/01/2017 0916   MONOABS 0.2 10/08/2016 1010   EOSABS 0.2 03/01/2017 0916   EOSABS 0.2 10/08/2016 1010   BASOSABS 0.0 03/01/2017 0916   BASOSABS 0.0 10/08/2016 1010    . CMP Latest Ref Rng & Units 02/14/2017 10/08/2016 07/08/2016  Glucose 70 - 99 mg/dL 97 85 93  BUN 6 - 23 mg/dL 10 13.4 19.5  Creatinine 0.40 - 1.20 mg/dL 0.85 0.9 0.9  Sodium 135 - 145 mEq/L 141 139 137  Potassium 3.5 - 5.1 mEq/L 4.1 4.4 4.3  Chloride 96 - 112 mEq/L 102 - -  CO2 19 - 32 mEq/L 29 27  25   Calcium 8.4 - 10.5 mg/dL 9.2 9.7 9.8  Total Protein 6.4 - 8.3 g/dL - 7.2 7.7  Total Bilirubin 0.20 - 1.20 mg/dL - 0.42 0.32  Alkaline Phos 40 - 150 U/L - 117 126  AST 5 - 34 U/L - 30 24  ALT 0 - 55 U/L - 34 24   . Lab Results  Component Value Date   IRON 83 03/01/2017   TIBC 338 03/01/2017   IRONPCTSAT 25 03/01/2017   (Iron and TIBC)  Lab Results  Component Value Date   FERRITIN 35 03/01/2017    RADIOGRAPHIC STUDIES: I have personally reviewed the radiological images as listed and agreed with the findings in the report. No results found.  ASSESSMENT & PLAN:   52 year old female with  #1 Leukopenia at least from January 2018. Total WBC counts elevated from 2.9-3.7k. Repeat labs previously showed resolution of her leukopenia with a WBC count of 3.3k. No neutropenia. ANC 3.3k -resolved. ?autoimmune component.  #2 lymphopenia -this has been the primary component of her leukopenia. Labs today show normalization of total WBC count at 4.1k. She still shows signs of lymphopenia with lymphocyte count of 0.3k. Patient has not had any issues with increased infections. No constitutional symptoms. Patient has been on steroids for her skin condition which can cause some lymphopenia. Lymphopenia could also be associated with her allergies/skin condition. Improved Hepatitis C testing was negative. ANA negative Vitamin B12 and folate within normal limits LDH is within normal limits and patient has no clinically evident lymphadenopathy or hepatosplenomegaly to suggest a lymphoproliferative process. Tryptase levels within normal limits Sedimentation rate and CRP within normal limits. Myeloma panel showed no monoclonal gammopathy ?subclinical viral infection.  Plan  -lab results were discussed with the patient. -No indication for additional workup for bone marrow biopsy at this time.  #3 Microcytic anemia -due to iron deficiency. . Lab Results  Component Value  Date   IRON 72  09/20/2016   TIBC 459 (H) 07/08/2016   IRONPCTSAT 14.1 (L) 09/20/2016   (Iron and TIBC)  Lab Results  Component Value Date   FERRITIN 42.8 02/14/2017   Patient notes no overt bleeding.  Patient's ferritin and iron levels are unchanged after 6-8 weeks of PO iron replacement. ?absorption issue due to chronic PPI use. Has been evaluation by GI/Dr Harl Bowie - pending GI workup in 04/2017 #4 Allergic/Hypersensitivity reaction to IV Feraheme.  Labs in 4 weeks RTC with Dr Irene Limbo in 8 weeks with labs   All of the patients questions were answered with apparent satisfaction. The patient knows to call the clinic with any problems, questions or concerns.  I spent 20 minutes counseling the patient face to face. The total time spent in the appointment was 25 minutes and more than 50% was on counseling and direct patient cares.    Sullivan Lone MD [place] AAHIVMS [facility] Hematology/Oncology Physician [facility]  (Office):       [phone] (Work cell):  [phone] (Fax):           [phone]  03/01/2017 9:53 AM   This document serves as a record of services personally performed by Sullivan Lone, MD. It was created on his behalf by Joslyn Devon, a trained medical scribe. The creation of this record is based on the scribe's personal observations and the provider's statements to them.    . Lab Results  Component Value Date   IRON 83 03/01/2017   TIBC 338 03/01/2017   IRONPCTSAT 25 03/01/2017   (Iron and TIBC)  Lab Results  Component Value Date   FERRITIN 35 03/01/2017

## 2017-02-28 ENCOUNTER — Encounter: Payer: 59 | Admitting: Gastroenterology

## 2017-03-01 ENCOUNTER — Encounter: Payer: Self-pay | Admitting: Hematology

## 2017-03-01 ENCOUNTER — Other Ambulatory Visit: Payer: Self-pay

## 2017-03-01 ENCOUNTER — Encounter (INDEPENDENT_AMBULATORY_CARE_PROVIDER_SITE_OTHER): Payer: Self-pay

## 2017-03-01 ENCOUNTER — Inpatient Hospital Stay: Payer: 59

## 2017-03-01 ENCOUNTER — Inpatient Hospital Stay: Payer: 59 | Attending: Hematology | Admitting: Hematology

## 2017-03-01 ENCOUNTER — Telehealth: Payer: Self-pay | Admitting: Hematology

## 2017-03-01 VITALS — BP 152/74 | HR 73 | Temp 98.1°F | Resp 18 | Ht 62.0 in | Wt 158.4 lb

## 2017-03-01 DIAGNOSIS — D72819 Decreased white blood cell count, unspecified: Secondary | ICD-10-CM

## 2017-03-01 DIAGNOSIS — D508 Other iron deficiency anemias: Secondary | ICD-10-CM

## 2017-03-01 DIAGNOSIS — D72810 Lymphocytopenia: Secondary | ICD-10-CM | POA: Diagnosis not present

## 2017-03-01 DIAGNOSIS — L308 Other specified dermatitis: Secondary | ICD-10-CM | POA: Diagnosis not present

## 2017-03-01 DIAGNOSIS — I10 Essential (primary) hypertension: Secondary | ICD-10-CM | POA: Insufficient documentation

## 2017-03-01 DIAGNOSIS — D509 Iron deficiency anemia, unspecified: Secondary | ICD-10-CM

## 2017-03-01 LAB — CBC WITH DIFFERENTIAL (CANCER CENTER ONLY)
Basophils Absolute: 0 10*3/uL (ref 0.0–0.1)
Basophils Relative: 1 %
EOS ABS: 0.2 10*3/uL (ref 0.0–0.5)
Eosinophils Relative: 4 %
HEMATOCRIT: 38.7 % (ref 34.8–46.6)
HEMOGLOBIN: 12.6 g/dL (ref 11.6–15.9)
LYMPHS ABS: 0.3 10*3/uL — AB (ref 0.9–3.3)
Lymphocytes Relative: 8 %
MCH: 27.5 pg (ref 25.1–34.0)
MCHC: 32.7 g/dL (ref 31.5–36.0)
MCV: 84.2 fL (ref 79.5–101.0)
Monocytes Absolute: 0.3 10*3/uL (ref 0.1–0.9)
Monocytes Relative: 7 %
NEUTROS ABS: 3.3 10*3/uL (ref 1.5–6.5)
NEUTROS PCT: 80 %
Platelet Count: 272 10*3/uL (ref 145–400)
RBC: 4.6 MIL/uL (ref 3.70–5.45)
RDW: 14 % (ref 11.2–16.1)
WBC: 4.1 10*3/uL (ref 3.9–10.3)

## 2017-03-01 LAB — IRON AND TIBC
Iron: 83 ug/dL (ref 41–142)
Saturation Ratios: 25 % (ref 21–57)
TIBC: 338 ug/dL (ref 236–444)
UIBC: 254 ug/dL

## 2017-03-01 LAB — CMP (CANCER CENTER ONLY)
ALK PHOS: 124 U/L (ref 40–150)
ALT: 45 U/L (ref 0–55)
AST: 29 U/L (ref 5–34)
Albumin: 4.1 g/dL (ref 3.5–5.0)
Anion gap: 8 (ref 3–11)
BUN: 13 mg/dL (ref 7–26)
CALCIUM: 9.4 mg/dL (ref 8.4–10.4)
CO2: 28 mmol/L (ref 22–29)
Chloride: 104 mmol/L (ref 98–109)
Creatinine: 0.87 mg/dL (ref 0.60–1.10)
GFR, Estimated: >60 mL/min (ref >60)
Glucose, Bld: 95 mg/dL (ref 70–140)
Potassium: 4.2 mmol/L (ref 3.3–4.7)
SODIUM: 140 mmol/L (ref 136–145)
Total Bilirubin: 0.4 mg/dL (ref 0.2–1.2)
Total Protein: 7.3 g/dL (ref 6.4–8.3)

## 2017-03-01 LAB — FERRITIN: FERRITIN: 35 ng/mL (ref 9–269)

## 2017-03-01 LAB — VITAMIN B12: Vitamin B-12: 1102 pg/mL — ABNORMAL HIGH (ref 180–914)

## 2017-03-01 MED ORDER — POLYSACCHARIDE IRON COMPLEX 150 MG PO CAPS: 150.0000 mg | ORAL_CAPSULE | Freq: Every day | ORAL | 3 refills | Status: DC

## 2017-03-01 NOTE — Telephone Encounter (Signed)
Gave avs and calendar for may

## 2017-03-07 MED FILL — FLUoxetine HCL 40 MG CAPS: 40 | 30 days supply | Qty: 30 | Fill #3

## 2017-03-25 ENCOUNTER — Encounter: Payer: Self-pay | Admitting: Obstetrics and Gynecology

## 2017-03-25 ENCOUNTER — Other Ambulatory Visit (HOSPITAL_COMMUNITY)
Admission: RE | Admit: 2017-03-25 | Discharge: 2017-03-25 | Disposition: A | Discharge status: Home / Self Care | Payer: 59 | Source: Ambulatory Visit | Attending: Obstetrics and Gynecology | Admitting: Obstetrics and Gynecology

## 2017-03-25 ENCOUNTER — Ambulatory Visit (INDEPENDENT_AMBULATORY_CARE_PROVIDER_SITE_OTHER): Payer: 59 | Admitting: Obstetrics and Gynecology

## 2017-03-25 ENCOUNTER — Other Ambulatory Visit: Payer: Self-pay

## 2017-03-25 VITALS — BP 130/70 | HR 80 | Resp 14 | Ht 62.5 in | Wt 157.0 lb

## 2017-03-25 DIAGNOSIS — Z124 Encounter for screening for malignant neoplasm of cervix: Secondary | ICD-10-CM | POA: Diagnosis not present

## 2017-03-25 DIAGNOSIS — R87810 Cervical high risk human papillomavirus (HPV) DNA test positive: Secondary | ICD-10-CM | POA: Insufficient documentation

## 2017-03-25 DIAGNOSIS — R87612 Low grade squamous intraepithelial lesion on cytologic smear of cervix (LGSIL): Secondary | ICD-10-CM | POA: Diagnosis not present

## 2017-03-25 DIAGNOSIS — Z01419 Encounter for gynecological examination (general) (routine) without abnormal findings: Secondary | ICD-10-CM

## 2017-03-25 NOTE — Patient Instructions (Signed)

## 2017-03-25 NOTE — Progress Notes (Signed)
52 y.o. [id number] MarriedCaucasianF here for annual exam.   Her Dad had a massive stroke in 9/18, she took 3 months of FLMA. She got married in December.  No vaginal bleeding, no dyspareunia.     Patient's last menstrual period was 12/02/2012.          Sexually active: Yes.    The current method of family planning is post menopausal status.    Exercising: Yes.     Smoker:  no  Health Maintenance: Pap:  02-12-16 WNL + HR HPV, negative 16/18/45  08-24-12 WNL NEG HR HPV  History of abnormal Pap:  Yes, h/o LEEP MMG:  08-26-16 WNL  Colonoscopy:  Will have done 05/01/17  TDaP:  05-11-13 Gardasil: N/A   reports that  has never smoked. she has never used smokeless tobacco. She reports that she drinks about 1.2 oz of alcohol per week. She reports that she does not use drugs. 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 Kids are 72 and 43, live with there dad in [place].    Past Medical History:  Diagnosis Date  . Depression   . Eczema   . GERD (gastroesophageal reflux disease)   . Hyperlipidemia   . Hypertension   . Urticaria     Past Surgical History:  Procedure Laterality Date  . COLPOSCOPY  2006    Current Outpatient Medications  Medication Sig Dispense Refill  . amLODipine (NORVASC) 5 MG tablet TAKE 1 TABLET BY MOUTH DAILY. 90 tablet 1  . aspirin 81 MG tablet Take 81 mg by mouth daily.    Stasia Cavalier (EUCRISA) 2 % OINT Apply 1 application topically 2 (two) times daily. 60 g 3  . Fish Oil OIL 1,200 mg daily.     [name] FLUoxetine (PROZAC) 40 MG capsule TAKE 1 CAPSULE BY MOUTH ONE TIME A DAY  30 capsule 5  . hydrOXYzine (ATARAX/VISTARIL) 25 MG tablet Take 1 tablet at bedtime as needed for itching 30 tablet 3  . iron polysaccharides (NIFEREX) 150 MG capsule Take 1 capsule (150 mg total) by mouth daily. 30 capsule 3  . Multiple Vitamin (MULTIVITAMIN) capsule Take 1 capsule by mouth daily.    [name] omeprazole (PRILOSEC) 40 MG capsule Take 1 capsule (40 mg total) by mouth 2 (two) times daily before a meal.  180 capsule 3  . pimecrolimus (ELIDEL) 1 % cream Apply topically 2 (two) times daily. 30 g 3  . Probiotic Product (PROBIOTIC-10 PO) Take by mouth.    Manus Gunning BOWEL PREP KIT 17.5-3.13-1.6 GM/180ML SOLN Suprep-Use as directed 354 mL 0   No current facility-administered medications for this visit.     Family History  Problem Relation Age of Onset  . Heart disease Father   . Hyperlipidemia Father   . Hypertension Father   . Heart disease Paternal Grandfather   . Crohn's disease Son   . Breast cancer Neg Hx     Review of Systems  Constitutional: Negative.   HENT: Negative.   Eyes: Negative.   Respiratory: Negative.   Cardiovascular: Negative.   Gastrointestinal: Negative.   Endocrine: Negative.   Genitourinary: Negative.   Musculoskeletal: Negative.   Skin: Negative.   Allergic/Immunologic: Negative.   Neurological: Negative.   Psychiatric/Behavioral: Negative.     Exam:   BP 130/70 (BP Location: Right Arm, Patient Position: Sitting, Cuff Size: Normal)   Pulse 80   Resp 14   Ht 5' 2.5" (1.588 m)   Wt 157 lb (71.2 kg)   LMP 12/02/2012   BMI  28.26 kg/m   Weight change: '@WEIGHTCHANGE' @ Height:   Height: 5' 2.5" (158.8 cm)  Ht Readings from Last 3 Encounters:  03/25/17 5' 2.5" (1.588 m)  03/01/17 '5\' 2"'  (1.575 m)  02/14/17 '5\' 2"'  (1.575 m)    General appearance: alert, cooperative and appears stated age Head: Normocephalic, without obvious abnormality, atraumatic Neck: no adenopathy, supple, symmetrical, trachea midline and thyroid normal to inspection and palpation Lungs: clear to auscultation bilaterally Cardiovascular: regular rate and rhythm Breasts: normal appearance, no masses or tenderness Abdomen: soft, non-tender; non distended,  no masses,  no organomegaly Extremities: extremities normal, atraumatic, no cyanosis or edema Skin: Skin color, texture, turgor normal. No rashes or lesions Lymph nodes: Cervical, supraclavicular, and axillary nodes normal. No  abnormal inguinal nodes palpated Neurologic: Grossly normal   Pelvic: External genitalia:  no lesions              Urethra:  normal appearing urethra with no masses, tenderness or lesions              Bartholins and Skenes: normal                 Vagina: normal appearing vagina with normal color and discharge, no lesions              Cervix: no lesions               Bimanual Exam:  Uterus:  normal size, contour, position, consistency, mobility, non-tender              Adnexa: no mass, fullness, tenderness               Rectovaginal: Confirms               Anus:  normal sphincter tone, no lesions  Chaperone was present for exam.  A:  Well Woman with normal exam  H/O +HPV  P:   Pap with hpv  Labs with primary   Mammogram UTD  Discussed breast self exam  Discussed calcium and vit D intake  Colonoscopy scheduled

## 2017-03-29 LAB — CYTOLOGY - PAP: HPV (WINDOPATH): DETECTED — AB

## 2017-03-31 ENCOUNTER — Telehealth: Payer: Self-pay

## 2017-03-31 DIAGNOSIS — B977 Papillomavirus as the cause of diseases classified elsewhere: Secondary | ICD-10-CM

## 2017-03-31 DIAGNOSIS — R87612 Low grade squamous intraepithelial lesion on cytologic smear of cervix (LGSIL): Secondary | ICD-10-CM

## 2017-03-31 NOTE — Telephone Encounter (Signed)
Spoke with patient. Advised of results as seen below from [place]. Patient verbalizes understanding. Patient is post menopausal. Appointment for colposcopy scheduled for 04/11/2017 at 9 am with Dr.Jertson.  Instructions given. Motrin 800 mg po x , one hour before appointment with food. Make sure to eat a meal before appointment and drink plenty of fluids. Patient agreeable and verbalized understanding of all instructions. Order placed. Will close encounter.

## 2017-03-31 NOTE — Telephone Encounter (Signed)
-----   Message from [name], MD sent at 03/30/2017  4:55 PM EST ----- Please inform the patient that she has a LSIL pap with + hpv and set her up for a colposcopy. She has had a leep in the past so she will understand what this is.

## 2017-04-01 ENCOUNTER — Telehealth: Payer: Self-pay | Admitting: Obstetrics and Gynecology

## 2017-04-01 NOTE — Telephone Encounter (Signed)
Left message upcoming colposcopy appointment 04/11/17 has been canceled due to md in surgery. I left a message for patient to ask for the triage nurse to reschedule colonoscopy.

## 2017-04-04 NOTE — Telephone Encounter (Signed)
Spoke with patient. Contraceptive, postmenopausal. Colpo rescheduled to 04/12/17 at 3:30pm with Dr. Talbert Nan. Patient verbalizes understanding and is agreeable.  Routing to provider for final review. Patient is agreeable to disposition. Will close encounter.  Cc: Lerry Liner

## 2017-04-04 NOTE — Telephone Encounter (Signed)
Patient calling to reschedule colposcopy originally scheduled for 04/11/17.

## 2017-04-11 ENCOUNTER — Ambulatory Visit: Payer: 59 | Admitting: Obstetrics and Gynecology

## 2017-04-11 MED FILL — FLUoxetine HCL 40 MG CAPS: 40 | 30 days supply | Qty: 30 | Fill #4

## 2017-04-12 ENCOUNTER — Encounter: Payer: Self-pay | Admitting: Obstetrics and Gynecology

## 2017-04-12 ENCOUNTER — Ambulatory Visit (INDEPENDENT_AMBULATORY_CARE_PROVIDER_SITE_OTHER): Payer: 59 | Admitting: Obstetrics and Gynecology

## 2017-04-12 ENCOUNTER — Other Ambulatory Visit: Payer: Self-pay

## 2017-04-12 VITALS — BP 148/60 | HR 90 | Resp 16 | Wt 160.4 lb

## 2017-04-12 DIAGNOSIS — Z01812 Encounter for preprocedural laboratory examination: Secondary | ICD-10-CM | POA: Diagnosis not present

## 2017-04-12 DIAGNOSIS — N870 Mild cervical dysplasia: Secondary | ICD-10-CM | POA: Diagnosis not present

## 2017-04-12 DIAGNOSIS — R87612 Low grade squamous intraepithelial lesion on cytologic smear of cervix (LGSIL): Secondary | ICD-10-CM | POA: Diagnosis not present

## 2017-04-12 DIAGNOSIS — N882 Stricture and stenosis of cervix uteri: Secondary | ICD-10-CM | POA: Diagnosis not present

## 2017-04-12 DIAGNOSIS — B977 Papillomavirus as the cause of diseases classified elsewhere: Secondary | ICD-10-CM

## 2017-04-12 LAB — POCT URINE PREGNANCY: Preg Test, Ur: NEGATIVE

## 2017-04-12 NOTE — Patient Instructions (Signed)

## 2017-04-12 NOTE — Progress Notes (Signed)
GYNECOLOGY  VISIT   HPI: 52 y.o.   Married  Caucasian  female   G2P2002 with Patient's last menstrual period was 12/02/2012.   here for   Colposcopy for LSIL pap with hpv Pap in 1/18 was WNL with +HPV (negative 16/18/45). H/O LEEP in the past. Dad died last week, had a stroke 6 months ago. She has been taking care of him.   GYNECOLOGIC HISTORY: Patient's last menstrual period was 12/02/2012. Contraception: postmenopausal  Menopausal hormone therapy: none        OB History    Gravida Para Term Preterm AB Living   _0 SAB TAB Ectopic Multiple Live Births           2         Patient Active Problem List   Diagnosis Date Noted  . Iron deficiency anemia 10/08/2016  . Hyperlipemia 09/29/2015  . Essential hypertension 09/29/2015  . Hyperglycemia 09/29/2015  . Major depressive disorder with single episode, in full remission ([place]) 09/29/2015    Past Medical History:  Diagnosis Date  . Depression   . Eczema   . GERD (gastroesophageal reflux disease)   . Hyperlipidemia   . Hypertension   . Urticaria     Past Surgical History:  Procedure Laterality Date  . COLPOSCOPY  2006    Current Outpatient Medications  Medication Sig Dispense Refill  . amLODipine (NORVASC) 5 MG tablet TAKE 1 TABLET BY MOUTH DAILY. 90 tablet 1  . aspirin 81 MG tablet Take 81 mg by mouth daily.    Stasia Cavalier (EUCRISA) 2 % OINT Apply 1 application topically 2 (two) times daily. 60 g 3  . Fish Oil OIL 1,200 mg daily.     [name] FLUoxetine (PROZAC) 40 MG capsule TAKE 1 CAPSULE BY MOUTH ONE TIME A DAY  30 capsule 5  . hydrOXYzine (ATARAX/VISTARIL) 25 MG tablet Take 1 tablet at bedtime as needed for itching 30 tablet 3  . iron polysaccharides (NIFEREX) 150 MG capsule Take 1 capsule (150 mg total) by mouth daily. 30 capsule 3  . Multiple Vitamin (MULTIVITAMIN) capsule Take 1 capsule by mouth daily.    [name] omeprazole (PRILOSEC) 40 MG capsule Take 1 capsule (40 mg total) by mouth 2 (two) times daily before  a meal. 180 capsule 3  . pimecrolimus (ELIDEL) 1 % cream Apply topically 2 (two) times daily. 30 g 3  . Probiotic Product (PROBIOTIC-10 PO) Take by mouth.    Manus Gunning BOWEL PREP KIT 17.5-3.13-1.6 GM/180ML SOLN Suprep-Use as directed (Patient not taking: Reported on 03/25/2017) 354 mL 0   No current facility-administered medications for this visit.      ALLERGIES: Penicillins; Sulfa antibiotics; and Feraheme [ferumoxytol]  Family History  Problem Relation Age of Onset  . Heart disease Father   . Hyperlipidemia Father   . Hypertension Father   . Heart disease Paternal Grandfather   . Crohn's disease Son   . Breast cancer Neg Hx     Social History   Socioeconomic History  . Marital status: Married    Spouse name: Not on file  . Number of children: Not on file  . Years of education: Not on file  . Highest education level: Not on file  Social Needs  . Financial resource strain: Not on file  . Food insecurity - worry: Not on file  . Food insecurity - inability: Not on file  . Transportation needs - medical: Not on file  .  Transportation needs - non-medical: Not on file  Occupational History  . Not on file  Tobacco Use  . Smoking status: Never Smoker  . Smokeless tobacco: Never Used  Substance and Sexual Activity  . Alcohol use: Yes    Alcohol/week: 1.2 oz    Types: 2 Standard drinks or equivalent per week  . Drug use: No  . Sexual activity: Yes    Partners: Male    Birth control/protection: Post-menopausal  Other Topics Concern  . Not on file  Social History Narrative   Work or School: Publishing copy Situation: lives with boyfriend      Spiritual Beliefs: Christian      Lifestyle: exercising, trying to eat well             Review of Systems  Constitutional: Negative.   HENT: Negative.   Eyes: Negative.   Respiratory: Negative.   Cardiovascular: Negative.   Gastrointestinal: Negative.   Genitourinary: Negative.    Musculoskeletal: Negative.   Skin: Negative.   Neurological: Negative.   Endo/Heme/Allergies: Negative.   Psychiatric/Behavioral: Negative.     PHYSICAL EXAMINATION:    BP (!) 148/60 (BP Location: Right Arm, Patient Position: Sitting, Cuff Size: Normal)   Pulse 90   Resp 16   Wt 160 lb 6.4 oz (72.8 kg)   LMP 12/02/2012   BMI 28.87 kg/m     General appearance: alert, cooperative and appears stated age  Pelvic: External genitalia:  no lesions              Urethra:  normal appearing urethra with no masses, tenderness or lesions              Bartholins and Skenes: normal                 Vagina: normal appearing vagina with normal color and discharge, no lesions              Cervix: grossly normal, stenotic  Colposcopy: unsatisfactory, no aceto-white changes, negative lugols examination of the cervix and upper vagina. Needed to place a tenaculum and dilate the cervix to a #5 Hagar dilator in order to obtain the ECC. ECC obtained. Tenaculum removed  Chaperone was present for exam.  ASSESSMENT LSIL pap, +HPV. Unsatisfactory coloposcopy Cervical stenosis, needed dilation    PLAN Colposcopy with cervical dilation and ECC Further plans depending on results   An After Visit Summary was printed and given to the patient.

## 2017-04-15 ENCOUNTER — Ambulatory Visit (AMBULATORY_SURGERY_CENTER): Payer: Self-pay | Admitting: *Deleted

## 2017-04-15 ENCOUNTER — Other Ambulatory Visit: Payer: Self-pay

## 2017-04-15 VITALS — Ht 62.0 in | Wt 160.0 lb

## 2017-04-15 DIAGNOSIS — D509 Iron deficiency anemia, unspecified: Secondary | ICD-10-CM

## 2017-04-15 DIAGNOSIS — Z1211 Encounter for screening for malignant neoplasm of colon: Secondary | ICD-10-CM

## 2017-04-15 NOTE — Progress Notes (Signed)
Patient denies any allergies to eggs or soy. Patient denies any problems with anesthesia/sedation. Patient denies any oxygen use at home. Patient denies taking any diet/weight loss medications or blood thinners. EMMI education assisgned to patient on colonoscopy and EGD, this was explained and instructions given to patient. Patient has Suprep at home already.

## 2017-04-18 ENCOUNTER — Encounter: Payer: Self-pay | Admitting: Gastroenterology

## 2017-04-29 ENCOUNTER — Other Ambulatory Visit: Payer: Self-pay

## 2017-04-29 ENCOUNTER — Ambulatory Visit (INDEPENDENT_AMBULATORY_CARE_PROVIDER_SITE_OTHER): Payer: 59 | Admitting: Gastroenterology

## 2017-04-29 ENCOUNTER — Encounter: Payer: Self-pay | Admitting: Gastroenterology

## 2017-04-29 VITALS — BP 113/65 | HR 69 | Temp 97.5°F | Resp 12 | Ht 62.5 in | Wt 157.0 lb

## 2017-04-29 DIAGNOSIS — D508 Other iron deficiency anemias: Secondary | ICD-10-CM

## 2017-04-29 DIAGNOSIS — D509 Iron deficiency anemia, unspecified: Secondary | ICD-10-CM | POA: Diagnosis not present

## 2017-04-29 DIAGNOSIS — K319 Disease of stomach and duodenum, unspecified: Secondary | ICD-10-CM | POA: Diagnosis not present

## 2017-04-29 DIAGNOSIS — D500 Iron deficiency anemia secondary to blood loss (chronic): Secondary | ICD-10-CM

## 2017-04-29 DIAGNOSIS — D123 Benign neoplasm of transverse colon: Secondary | ICD-10-CM

## 2017-04-29 DIAGNOSIS — I10 Essential (primary) hypertension: Secondary | ICD-10-CM | POA: Diagnosis not present

## 2017-04-29 DIAGNOSIS — K635 Polyp of colon: Secondary | ICD-10-CM

## 2017-04-29 MED ORDER — SODIUM CHLORIDE 0.9 % IV SOLN: 500.0000 mL | Freq: Once | INTRAVENOUS | Status: DC

## 2017-04-29 NOTE — Op Note (Addendum)
[facility] Patient Name: Rachael Hodge Procedure Date: 04/29/2017 8:06 AM MRN: [id number] Endoscopist: Mauri Pole , MD Age: 52 Referring MD:  Date of Birth: 25-Jun-1965 Gender: Female Account #: [id number] Procedure:                Colonoscopy Indications:              Unexplained iron deficiency anemia Medicines:                Monitored Anesthesia Care Procedure:                Pre-Anesthesia Assessment:                           - Prior to the procedure, a History and Physical                            was performed, and patient medications and                            allergies were reviewed. The patient's tolerance of                            previous anesthesia was also reviewed. The risks                            and benefits of the procedure and the sedation                            options and risks were discussed with the patient.                            All questions were answered, and informed consent                            was obtained. Prior Anticoagulants: The patient has                            taken no previous anticoagulant or antiplatelet                            agents. ASA Grade Assessment: II - A patient with                            mild systemic disease. After reviewing the risks                            and benefits, the patient was deemed in                            satisfactory condition to undergo the procedure.                           After obtaining informed consent, the colonoscope  was passed under direct vision. Throughout the                            procedure, the patient's blood pressure, pulse, and                            oxygen saturations were monitored continuously. The                            Model PCF-[id number]) scope was introduced                            through the anus and advanced to the the terminal                            ileum, with  identification of the appendiceal                            orifice and IC valve. The colonoscopy was performed                            without difficulty. The patient tolerated the                            procedure well. The quality of the bowel                            preparation was excellent. The terminal ileum,                            ileocecal valve, appendiceal orifice, and rectum                            were photographed. Scope In: 8:15:03 AM Scope Out: 8:35:49 AM Scope Withdrawal Time: 0 hours 16 minutes 17 seconds  Total Procedure Duration: 0 hours 20 minutes 46 seconds  Findings:                 The perianal and digital rectal examinations were                            normal.                           A 1 mm polyp was found in the transverse colon. The                            polyp was sessile. The polyp was removed with a                            cold biopsy forceps. Resection and retrieval were                            complete.  A 3 mm polyp was found in the transverse colon. The                            polyp was sessile. The polyp was removed with a                            cold snare. Resection and retrieval were complete.                           Non-bleeding internal hemorrhoids were found during                            retroflexion. The hemorrhoids were small. Complications:            No immediate complications. Estimated Blood Loss:     Estimated blood loss was minimal. Impression:               - One 1 mm polyp in the transverse colon, removed                            with a cold biopsy forceps. Resected and retrieved.                           - One 3 mm polyp in the transverse colon, removed                            with a cold snare. Resected and retrieved.                           - Non-bleeding internal hemorrhoids. Recommendation:           - Patient has a contact number available for                             emergencies. The signs and symptoms of potential                            delayed complications were discussed with the                            patient. Return to normal activities tomorrow.                            Written discharge instructions were provided to the                            patient.                           - Resume previous diet.                           - Continue present medications.                           -  Await pathology results.                           - Repeat colonoscopy in 5-10 years for surveillance                            based on pathology results.                           - To visualize the small bowel, perform video                            capsule endoscopy at the next available appointment. Mauri Pole, MD 04/29/2017 8:48:39 AM This report has been signed electronically.

## 2017-04-29 NOTE — Progress Notes (Signed)
Called to room to assist during endoscopic procedure.  Patient ID and intended procedure confirmed with present staff. Received instructions for my participation in the procedure from the performing physician.  

## 2017-04-29 NOTE — Progress Notes (Signed)
Pt's states no medical or surgical changes since previsit or office visit. 

## 2017-04-29 NOTE — Progress Notes (Signed)
To recovery, report to RN, VSS. 

## 2017-04-29 NOTE — Patient Instructions (Signed)
YOU HAD AN ENDOSCOPIC PROCEDURE TODAY AT [facility]:   Refer to the procedure report that was given to you for any specific questions about what was found during the examination.  If the procedure report does not answer your questions, please call your gastroenterologist to clarify.  If you requested that your care partner not be given the details of your procedure findings, then the procedure report has been included in a sealed envelope for you to review at your convenience later.  YOU SHOULD EXPECT: Some feelings of bloating in the abdomen. Passage of more gas than usual.  Walking can help get rid of the air that was put into your GI tract during the procedure and reduce the bloating. If you had a lower endoscopy (such as a colonoscopy or flexible sigmoidoscopy) you may notice spotting of blood in your stool or on the toilet paper. If you underwent a bowel prep for your procedure, you may not have a normal bowel movement for a few days.  Please Note:  You might notice some irritation and congestion in your nose or some drainage.  This is from the oxygen used during your procedure.  There is no need for concern and it should clear up in a day or so.  SYMPTOMS TO REPORT IMMEDIATELY:   Following lower endoscopy (colonoscopy or flexible sigmoidoscopy):  Excessive amounts of blood in the stool  Significant tenderness or worsening of abdominal pains  Swelling of the abdomen that is new, acute  Fever of 100F or higher   Following upper endoscopy (EGD)  Vomiting of blood or coffee ground material  New chest pain or pain under the shoulder blades  Painful or persistently difficult swallowing  New shortness of breath  Fever of 100F or higher  Black, tarry-looking stools  For urgent or emergent issues, a gastroenterologist can be reached at any hour by calling [phone].   DIET:  We do recommend a small meal at first, but then you may proceed to your regular diet.  Drink  plenty of fluids but you should avoid alcoholic beverages for 24 hours.  ACTIVITY:  You should plan to take it easy for the rest of today and you should NOT DRIVE or use heavy machinery until tomorrow (because of the sedation medicines used during the test).    FOLLOW UP: Our staff will call the number listed on your records the next business day following your procedure to check on you and address any questions or concerns that you may have regarding the information given to you following your procedure. If we do not reach you, we will leave a message.  However, if you are feeling well and you are not experiencing any problems, there is no need to return our call.  We will assume that you have returned to your regular daily activities without incident.  If any biopsies were taken you will be contacted by phone or by letter within the next 1-3 weeks.  Please call us at [phone] if you have not heard about the biopsies in 3 weeks.    SIGNATURES/CONFIDENTIALITY: You and/or your care partner have signed paperwork which will be entered into your electronic medical record.  These signatures attest to the fact that that the information above on your After Visit Summary has been reviewed and is understood.  Full responsibility of the confidentiality of this discharge information lies with you and/or your care-partner.  Polyp and hemorrhoid information given.  Capsule endoscopy scheduled.  Instructions  given.

## 2017-04-29 NOTE — Op Note (Signed)
[facility] Patient Name: Rachael Hodge Procedure Date: 04/29/2017 8:07 AM MRN: [id number] Endoscopist: Mauri Pole , MD Age: 52 Referring MD:  Date of Birth: 1965-09-02 Gender: Female Account #: [id number] Procedure:                Upper GI endoscopy Indications:              Suspected upper gastrointestinal bleeding in                            patient with unexplained iron deficiency anemia Medicines:                Monitored Anesthesia Care Procedure:                Pre-Anesthesia Assessment:                           - Prior to the procedure, a History and Physical                            was performed, and patient medications and                            allergies were reviewed. The patient's tolerance of                            previous anesthesia was also reviewed. The risks                            and benefits of the procedure and the sedation                            options and risks were discussed with the patient.                            All questions were answered, and informed consent                            was obtained. Prior Anticoagulants: The patient has                            taken no previous anticoagulant or antiplatelet                            agents. ASA Grade Assessment: II - A patient with                            mild systemic disease. After reviewing the risks                            and benefits, the patient was deemed in                            satisfactory condition to undergo the procedure.  After obtaining informed consent, the endoscope was                            passed under direct vision. Throughout the                            procedure, the patient's blood pressure, pulse, and                            oxygen saturations were monitored continuously. The                            Model GIF-HQ190 [id number]) scope was introduced                            through  the mouth, and advanced to the second part                            of duodenum. The upper GI endoscopy was                            accomplished without difficulty. The patient                            tolerated the procedure well. Scope In: Scope Out: Findings:                 Esophagogastric landmarks were identified: the                            Z-line was found at 34 cm and the site of hiatal                            narrowing was found at 36 cm from the incisors.                           A small hiatal hernia was present.                           Striped mildly erythematous mucosa without bleeding                            was found in the gastric antrum. Biopsies were                            taken with a cold forceps for Helicobacter pylori                            testing using CLOtest.                           The examined duodenum was normal. Complications:            No immediate complications. Estimated Blood Loss:     Estimated blood loss was minimal. Impression:               -  Esophagogastric landmarks identified.                           - Small hiatal hernia.                           - Erythematous mucosa in the antrum. Biopsied.                           - Normal examined duodenum. Recommendation:           - Patient has a contact number available for                            emergencies. The signs and symptoms of potential                            delayed complications were discussed with the                            patient. Return to normal activities tomorrow.                            Written discharge instructions were provided to the                            patient.                           - Resume previous diet.                           - Continue present medications.                           - Await pathology results. Mauri Pole, MD 04/29/2017 8:45:03 AM This report has been signed electronically.

## 2017-05-02 ENCOUNTER — Telehealth: Payer: Self-pay

## 2017-05-02 LAB — HELICOBACTER PYLORI SCREEN-BIOPSY: UREASE: NEGATIVE

## 2017-05-02 NOTE — Telephone Encounter (Signed)
  Follow up Call-  Call back number 04/29/2017  Post procedure Call Back phone  # [phone]  Permission to leave phone message Yes  Some recent data might be hidden     Patient questions:  Do you have a fever, pain , or abdominal swelling? No. Pain Score  0 *  Have you tolerated food without any problems? Yes.    Have you been able to return to your normal activities? Yes.    Do you have any questions about your discharge instructions: Diet   No. Medications  No. Follow up visit  No.  Do you have questions or concerns about your Care? No.  Actions: * If pain score is 4 or above: No action needed, pain <4.

## 2017-05-03 ENCOUNTER — Other Ambulatory Visit: Payer: Self-pay

## 2017-05-03 DIAGNOSIS — D500 Iron deficiency anemia secondary to blood loss (chronic): Secondary | ICD-10-CM

## 2017-05-09 ENCOUNTER — Encounter (HOSPITAL_COMMUNITY)
Admission: RE | Disposition: A | Discharge status: Home / Self Care | Payer: Self-pay | Source: Ambulatory Visit | Attending: Gastroenterology

## 2017-05-09 ENCOUNTER — Encounter (HOSPITAL_COMMUNITY): Payer: Self-pay | Admitting: Gastroenterology

## 2017-05-09 ENCOUNTER — Encounter: Payer: Self-pay | Admitting: Gastroenterology

## 2017-05-09 ENCOUNTER — Ambulatory Visit (HOSPITAL_COMMUNITY)
Admission: RE | Admit: 2017-05-09 | Discharge: 2017-05-09 | Disposition: A | Discharge status: Home / Self Care | Payer: 59 | Source: Ambulatory Visit | Attending: Gastroenterology | Admitting: Gastroenterology

## 2017-05-09 DIAGNOSIS — D509 Iron deficiency anemia, unspecified: Secondary | ICD-10-CM | POA: Diagnosis not present

## 2017-05-09 DIAGNOSIS — R195 Other fecal abnormalities: Secondary | ICD-10-CM

## 2017-05-09 DIAGNOSIS — Z86010 Personal history of colonic polyps: Secondary | ICD-10-CM | POA: Diagnosis not present

## 2017-05-09 DIAGNOSIS — D500 Iron deficiency anemia secondary to blood loss (chronic): Secondary | ICD-10-CM

## 2017-05-09 DIAGNOSIS — K2970 Gastritis, unspecified, without bleeding: Secondary | ICD-10-CM | POA: Insufficient documentation

## 2017-05-09 HISTORY — PX: GIVENS CAPSULE STUDY: SHX5432

## 2017-05-09 SURGERY — IMAGING PROCEDURE, GI TRACT, INTRALUMINAL, VIA CAPSULE
Anesthesia: LOCAL

## 2017-05-09 MED FILL — FLUoxetine HCL 40 MG CAPS: 40 | 30 days supply | Qty: 30 | Fill #5

## 2017-05-09 MED FILL — AMLODIPINE BESYLATE 5 MG TA: 5 | 90 days supply | Qty: 90 | Fill #1

## 2017-05-09 SURGICAL SUPPLY — 1 items: TOWEL COTTON PACK 4EA (MISCELLANEOUS) ×4 IMPLANT

## 2017-05-09 NOTE — Progress Notes (Signed)
Givens capsule ingested by pt at 0810 without swallowing difficulty. Discharge instructions given to patient. Pt verbalized understanding of instruction.

## 2017-05-12 ENCOUNTER — Encounter: Payer: Self-pay | Admitting: Gastroenterology

## 2017-05-15 NOTE — Progress Notes (Addendum)
HPI:  Using dictation device. Unfortunately this device frequently misinterprets words/phrases.  Here for CPE: Due for ros, labs (hgba1c, lipids), phq9 -Concerns and/or follow up today:   Chronic medical problems summarized below were reviewed for changes.   Depression: -on prozac 53m daily -reports: doing well on the medication -PHQ9 0  HTN: -meds: amlodipine  Prediabetes/HLD: -not as good with lifestyle recently -QRISK2 2.9 in the past; she has opted against cholesterol lowering medication and prefers to work on lifestyle changes alone - now is interested in statin if not improving  Leukopenia/iron def anemia: -s/p evaluation with hematology in 2018, felt lymphonpenia related to allergies or virus per notes, no further eval advised -had eval with GI, endoscopy, capsule endoscopy -seeing Dr. KIrene Limboabout the iron def anemia   -Diet: variety of foods, balance, " I don't eat much" -Exercise: no regular exercise - plans to start exercising -Taking folic acid, vitamin D or calcium: no -Diabetes and Dyslipidemia Screening: fasting for labs -Vaccines: see vaccine section EPIC -pap history: sees Dr. JTalbert Nanfor gyn exams, well exam 03/2017 - pap with LSIL + hpv --> colposcopy per notes--> recs for repeat pap in 1 year -FDLMP: see nursing notes -sexual activity: yes, female partner, no new partners -wants STI testing (Hep C if born [birth year]: no -FH breast, colon or ovarian ca: see FH Last mammogram: sees Dr. JTalbert Nanfor gyn exams, last mammo 08/2016 Last colon cancer screening: saw GI for colonosocpy 04/2017, 10 year repeat advised per GI letter (benign polyps) Breast Ca Risk Assessment: see family history and pt history DEXA (>/= 647: n/a  -Alcohol, Tobacco, drug use: see social history  Review of Systems - no fevers, unintentional weight loss, vision loss, hearing loss, chest pain, sob, hemoptysis, melena, hematochezia, hematuria, genital discharge, changing or concerning skin  lesions, bleeding, bruising, loc, thoughts of self harm or SI  Past Medical History:  Diagnosis Date  . Depression   . Eczema   . GERD (gastroesophageal reflux disease)   . Hyperlipidemia   . Hypertension   . Urticaria     Past Surgical History:  Procedure Laterality Date  . COLPOSCOPY  2006  . GIVENS CAPSULE STUDY N/A 05/09/2017   Procedure: GIVENS CAPSULE STUDY;  Surgeon: NMauri Pole MD;  [facility];  Service: Endoscopy;  Laterality: N/A;    Family History  Problem Relation Age of Onset  . Heart disease Father   . Hyperlipidemia Father   . Hypertension Father   . Stroke Father   . Heart disease Paternal Grandfather   . Crohn's disease Son   . Breast cancer Neg Hx   . Colon cancer Neg Hx     Social History   Socioeconomic History  . Marital status: Married    Spouse name: Not on file  . Number of children: Not on file  . Years of education: Not on file  . Highest education level: Not on file  Occupational History  . Not on file  Social Needs  . Financial resource strain: Not on file  . Food insecurity:    Worry: Not on file    Inability: Not on file  . Transportation needs:    Medical: Not on file    Non-medical: Not on file  Tobacco Use  . Smoking status: Never Smoker  . Smokeless tobacco: Never Used  Substance and Sexual Activity  . Alcohol use: Yes    Alcohol/week: 1.2 oz    Types: 2 Cans of beer per week  .  Drug use: No  . Sexual activity: Yes    Partners: Male    Birth control/protection: Post-menopausal     Current Outpatient Medications:  .  amLODipine (NORVASC) 5 MG tablet, TAKE 1 TABLET BY MOUTH DAILY., Disp: 90 tablet, Rfl: 1 .  aspirin 81 MG tablet, Take 81 mg by mouth daily., Disp: , Rfl:  .  Crisaborole (EUCRISA) 2 % OINT, Apply 1 application topically 2 (two) times daily., Disp: 60 g, Rfl: 3 .  Fish Oil OIL, 1,200 mg daily. , Disp: , Rfl:  .  FLUoxetine (PROZAC) 40 MG capsule, TAKE 1 CAPSULE BY MOUTH ONE TIME A DAY  , Disp: 30 capsule, Rfl: 5 .  hydrOXYzine (ATARAX/VISTARIL) 25 MG tablet, Take 1 tablet at bedtime as needed for itching, Disp: 30 tablet, Rfl: 3 .  iron polysaccharides (NIFEREX) 150 MG capsule, Take 1 capsule (150 mg total) by mouth daily., Disp: 30 capsule, Rfl: 3 .  Multiple Vitamin (MULTIVITAMIN) capsule, Take 1 capsule by mouth daily., Disp: , Rfl:  .  omeprazole (PRILOSEC) 40 MG capsule, Take 1 capsule (40 mg total) by mouth 2 (two) times daily before a meal., Disp: 180 capsule, Rfl: 3 .  pimecrolimus (ELIDEL) 1 % cream, Apply topically 2 (two) times daily., Disp: 30 g, Rfl: 3 .  Probiotic Product (PROBIOTIC-10 PO), Take by mouth., Disp: , Rfl:   Current Facility-Administered Medications:  .  0.9 %  sodium chloride infusion, 500 mL, Intravenous, Once, Nandigam, Kavitha V, MD  EXAM:  Vitals:   05/17/17 0810  BP: 116/80  Pulse: 67  Temp: 98 F (36.7 C)  Body mass index is 28.21 kg/m.   GENERAL: vitals reviewed and listed below, alert, oriented, appears well hydrated and in no acute distress  HEENT: head atraumatic, PERRLA, normal appearance of eyes, ears, nose and mouth. moist mucus membranes.  NECK: supple, no masses or lymphadenopathy  LUNGS: clear to auscultation bilaterally, no rales, rhonchi or wheeze  CV: HRRR, no peripheral edema or cyanosis, normal pedal pulses  ABDOMEN: bowel sounds normal, soft, non tender to palpation, no masses, no rebound or guarding  GU/BREAST: declined, does with gyn  SKIN: sees dermatology per her report  MS: normal gait, moves all extremities normally  NEURO: normal gait, speech and thought processing grossly intact, muscle tone grossly intact throughout  PSYCH: normal affect, pleasant and cooperative  ASSESSMENT AND PLAN:  Discussed the following assessment and plan:  PREVENTIVE EXAM: -Discussed and advised all [place] preventive services health task force level A and B recommendations for age, sex and risks. -Advised at least 150  minutes of exercise per week and a healthy diet   2. Hyperlipidemia, unspecified hyperlipidemia type -discussed treatment options, she plans to start exercising, she is also interested in possible statin - pravastatin after discussion risks/benefits - Lipid panel  3. Essential hypertension -cont current tx -lifestyle recs  4. Hyperglycemia -lifestyle recs - Hemoglobin A1c  5. Major depressive disorder with single episode, in full remission ([place]) -see phq9 -doing well    Patient advised to return to clinic immediately if symptoms worsen or persist or new concerns.  Patient Instructions  BEFORE YOU LEAVE: -labs -phq9 in epic -follow up: 4 months  We have ordered labs or studies at this visit. It can take up to 1-2 weeks for results and processing. IF results require follow up or explanation, we will call you with instructions. Clinically stable results will be released to your [facility]. If you have not heard from [place] or cannot  find your results in [facility] in 2 weeks please contact our office at [phone].  If you are not yet signed up for [facility], please consider signing up.   Preventive Care 40-64 Years, Female Preventive care refers to lifestyle choices and visits with your health care provider that can promote health and wellness. What does preventive care include?  A yearly physical exam. This is also called an annual well check.  Dental exams once or twice a year.  Routine eye exams. Ask your health care provider how often you should have your eyes checked.  Personal lifestyle choices, including: ? Daily care of your teeth and gums. ? Regular physical activity. ? Eating a healthy diet. ? Avoiding tobacco and drug use. ? Limiting alcohol use. ? Practicing safe sex. ? Taking low-dose aspirin daily starting at age 74. ? Taking vitamin and mineral supplements as recommended by your health care provider. What happens during an annual well check? The services and  screenings done by your health care provider during your annual well check will depend on your age, overall health, lifestyle risk factors, and family history of disease. Counseling Your health care provider may ask you questions about your:  Alcohol use.  Tobacco use.  Drug use.  Emotional well-being.  Home and relationship well-being.  Sexual activity.  Eating habits.  Work and work Statistician.  Method of birth control.  Menstrual cycle.  Pregnancy history.  Screening You may have the following tests or measurements:  Height, weight, and BMI.  Blood pressure.  Lipid and cholesterol levels. These may be checked every 5 years, or more frequently if you are over 25 years old.  Skin check.  Lung cancer screening. You may have this screening every year starting at age 5 if you have a 30-pack-year history of smoking and currently smoke or have quit within the past 15 years.  Fecal occult blood test (FOBT) of the stool. You may have this test every year starting at age 20.  Flexible sigmoidoscopy or colonoscopy. You may have a sigmoidoscopy every 5 years or a colonoscopy every 10 years starting at age 57.  Hepatitis C blood test.  Hepatitis B blood test.  Sexually transmitted disease (STD) testing.  Diabetes screening. This is done by checking your blood sugar (glucose) after you have not eaten for a while (fasting). You may have this done every 1-3 years.  Mammogram. This may be done every 1-2 years. Talk to your health care provider about when you should start having regular mammograms. This may depend on whether you have a family history of breast cancer.  BRCA-related cancer screening. This may be done if you have a family history of breast, ovarian, tubal, or peritoneal cancers.  Pelvic exam and Pap test. This may be done every 3 years starting at age 42. Starting at age 72, this may be done every 5 years if you have a Pap test in combination with an HPV  test.  Bone density scan. This is done to screen for osteoporosis. You may have this scan if you are at high risk for osteoporosis.  Discuss your test results, treatment options, and if necessary, the need for more tests with your health care provider. Vaccines Your health care provider may recommend certain vaccines, such as:  Influenza vaccine. This is recommended every year.  Tetanus, diphtheria, and acellular pertussis (Tdap, Td) vaccine. You may need a Td booster every 10 years.  Varicella vaccine. You may need this if you have not been vaccinated.  Zoster vaccine. You may need this after age 4.  Measles, mumps, and rubella (MMR) vaccine. You may need at least one dose of MMR if you were born in 1957 or later. You may also need a second dose.  Pneumococcal 13-valent conjugate (PCV13) vaccine. You may need this if you have certain conditions and were not previously vaccinated.  Pneumococcal polysaccharide (PPSV23) vaccine. You may need one or two doses if you smoke cigarettes or if you have certain conditions.  Meningococcal vaccine. You may need this if you have certain conditions.  Hepatitis A vaccine. You may need this if you have certain conditions or if you travel or work in places where you may be exposed to hepatitis A.  Hepatitis B vaccine. You may need this if you have certain conditions or if you travel or work in places where you may be exposed to hepatitis B.  Haemophilus influenzae type b (Hib) vaccine. You may need this if you have certain conditions.  Talk to your health care provider about which screenings and vaccines you need and how often you need them. This information is not intended to replace advice given to you by your health care provider. Make sure you discuss any questions you have with your health care provider. Document Released: 02/14/2015 Document Revised: 10/08/2015 Document Reviewed: 11/19/2014 Elsevier Interactive Patient Education  2018  [employer].          No follow-ups on file.  Lucretia Kern, DO

## 2017-05-17 ENCOUNTER — Encounter: Payer: Self-pay | Admitting: Family Medicine

## 2017-05-17 ENCOUNTER — Ambulatory Visit: Payer: 59 | Admitting: Family Medicine

## 2017-05-17 ENCOUNTER — Ambulatory Visit (INDEPENDENT_AMBULATORY_CARE_PROVIDER_SITE_OTHER): Payer: 59 | Admitting: Family Medicine

## 2017-05-17 VITALS — BP 116/80 | HR 67 | Temp 98.0°F | Ht 62.75 in | Wt 158.0 lb

## 2017-05-17 DIAGNOSIS — I10 Essential (primary) hypertension: Secondary | ICD-10-CM | POA: Diagnosis not present

## 2017-05-17 DIAGNOSIS — Z0000 Encounter for general adult medical examination without abnormal findings: Secondary | ICD-10-CM

## 2017-05-17 DIAGNOSIS — R739 Hyperglycemia, unspecified: Secondary | ICD-10-CM

## 2017-05-17 DIAGNOSIS — F325 Major depressive disorder, single episode, in full remission: Secondary | ICD-10-CM

## 2017-05-17 DIAGNOSIS — E785 Hyperlipidemia, unspecified: Secondary | ICD-10-CM

## 2017-05-17 LAB — LIPID PANEL
CHOLESTEROL: 271 mg/dL — AB (ref 0–200)
HDL: 67.6 mg/dL (ref >39.00)
LDL Cholesterol: 171 mg/dL — ABNORMAL HIGH (ref 0–99)
NonHDL: 202.95
TRIGLYCERIDES: 160 mg/dL — AB (ref 0.0–149.0)
Total CHOL/HDL Ratio: 4
VLDL: 32 mg/dL (ref 0.0–40.0)

## 2017-05-17 LAB — HEMOGLOBIN A1C: HEMOGLOBIN A1C: 6.5 % (ref 4.6–6.5)

## 2017-05-17 NOTE — Patient Instructions (Signed)
BEFORE YOU LEAVE: -labs -phq9 in epic -follow up: 4 months  We have ordered labs or studies at this visit. It can take up to 1-2 weeks for results and processing. IF results require follow up or explanation, we will call you with instructions. Clinically stable results will be released to your [facility]. If you have not heard from [place] or cannot find your results in [facility] in 2 weeks please contact our office at [phone].  If you are not yet signed up for [facility], please consider signing up.   Preventive Care 40-64 Years, Female Preventive care refers to lifestyle choices and visits with your health care provider that can promote health and wellness. What does preventive care include?  A yearly physical exam. This is also called an annual well check.  Dental exams once or twice a year.  Routine eye exams. Ask your health care provider how often you should have your eyes checked.  Personal lifestyle choices, including: ? Daily care of your teeth and gums. ? Regular physical activity. ? Eating a healthy diet. ? Avoiding tobacco and drug use. ? Limiting alcohol use. ? Practicing safe sex. ? Taking low-dose aspirin daily starting at age 77. ? Taking vitamin and mineral supplements as recommended by your health care provider. What happens during an annual well check? The services and screenings done by your health care provider during your annual well check will depend on your age, overall health, lifestyle risk factors, and family history of disease. Counseling Your health care provider may ask you questions about your:  Alcohol use.  Tobacco use.  Drug use.  Emotional well-being.  Home and relationship well-being.  Sexual activity.  Eating habits.  Work and work Statistician.  Method of birth control.  Menstrual cycle.  Pregnancy history.  Screening You may have the following tests or measurements:  Height, weight, and BMI.  Blood pressure.  Lipid and  cholesterol levels. These may be checked every 5 years, or more frequently if you are over 43 years old.  Skin check.  Lung cancer screening. You may have this screening every year starting at age 50 if you have a 30-pack-year history of smoking and currently smoke or have quit within the past 15 years.  Fecal occult blood test (FOBT) of the stool. You may have this test every year starting at age 87.  Flexible sigmoidoscopy or colonoscopy. You may have a sigmoidoscopy every 5 years or a colonoscopy every 10 years starting at age 73.  Hepatitis C blood test.  Hepatitis B blood test.  Sexually transmitted disease (STD) testing.  Diabetes screening. This is done by checking your blood sugar (glucose) after you have not eaten for a while (fasting). You may have this done every 1-3 years.  Mammogram. This may be done every 1-2 years. Talk to your health care provider about when you should start having regular mammograms. This may depend on whether you have a family history of breast cancer.  BRCA-related cancer screening. This may be done if you have a family history of breast, ovarian, tubal, or peritoneal cancers.  Pelvic exam and Pap test. This may be done every 3 years starting at age 30. Starting at age 77, this may be done every 5 years if you have a Pap test in combination with an HPV test.  Bone density scan. This is done to screen for osteoporosis. You may have this scan if you are at high risk for osteoporosis.  Discuss your test results, treatment options, and  if necessary, the need for more tests with your health care provider. Vaccines Your health care provider may recommend certain vaccines, such as:  Influenza vaccine. This is recommended every year.  Tetanus, diphtheria, and acellular pertussis (Tdap, Td) vaccine. You may need a Td booster every 10 years.  Varicella vaccine. You may need this if you have not been vaccinated.  Zoster vaccine. You may need this after age  68.  Measles, mumps, and rubella (MMR) vaccine. You may need at least one dose of MMR if you were born in 1957 or later. You may also need a second dose.  Pneumococcal 13-valent conjugate (PCV13) vaccine. You may need this if you have certain conditions and were not previously vaccinated.  Pneumococcal polysaccharide (PPSV23) vaccine. You may need one or two doses if you smoke cigarettes or if you have certain conditions.  Meningococcal vaccine. You may need this if you have certain conditions.  Hepatitis A vaccine. You may need this if you have certain conditions or if you travel or work in places where you may be exposed to hepatitis A.  Hepatitis B vaccine. You may need this if you have certain conditions or if you travel or work in places where you may be exposed to hepatitis B.  Haemophilus influenzae type b (Hib) vaccine. You may need this if you have certain conditions.  Talk to your health care provider about which screenings and vaccines you need and how often you need them. This information is not intended to replace advice given to you by your health care provider. Make sure you discuss any questions you have with your health care provider. Document Released: 02/14/2015 Document Revised: 10/08/2015 Document Reviewed: 11/19/2014 Elsevier Interactive Patient Education  [employer].

## 2017-05-19 ENCOUNTER — Telehealth: Payer: Self-pay | Admitting: Gastroenterology

## 2017-05-19 NOTE — Telephone Encounter (Signed)
Pt calling about capsule endo results, please advise.

## 2017-05-19 NOTE — Telephone Encounter (Signed)
Amy thought patient did not do the test as she did not the see the study. Can we please check with endo? Please inform patient that there is a technical issue and will get back to her early next week. Thanks

## 2017-05-19 NOTE — Telephone Encounter (Signed)
Spoke with pt and she is aware.

## 2017-05-23 MED ORDER — PRAVASTATIN SODIUM 20 MG PO TABS: 20.0000 mg | ORAL_TABLET | Freq: Every day | ORAL | 1 refills | Status: DC

## 2017-05-23 MED FILL — PRAVASTATIN SODIUM 20 MG TA: 20 | 90 days supply | Qty: 90 | Fill #0

## 2017-05-23 NOTE — Addendum Note (Signed)
Addended by: Agnes Lawrence on: 05/23/2017 01:19 PM   Modules accepted: Orders

## 2017-05-25 ENCOUNTER — Other Ambulatory Visit: Payer: Self-pay

## 2017-05-25 ENCOUNTER — Telehealth: Payer: Self-pay

## 2017-05-25 ENCOUNTER — Telehealth: Payer: Self-pay | Admitting: Gastroenterology

## 2017-05-25 DIAGNOSIS — D500 Iron deficiency anemia secondary to blood loss (chronic): Secondary | ICD-10-CM

## 2017-05-25 MED ORDER — BUDESONIDE 3 MG PO CPEP: 9.0000 mg | ORAL_CAPSULE | Freq: Every day | ORAL | 3 refills | Status: DC

## 2017-05-25 MED FILL — BUDESONIDE 3 MG CPEP: 3 | 30 days supply | Qty: 90 | Fill #0

## 2017-05-25 NOTE — Telephone Encounter (Signed)
Advised of mild patchy erythema mid small bowel. One ulcer in terminal ileum. Cannot exclude IBD/Crohn's. Agrees to come for CRP tomorrow. Entocort 9 mg Rx sent to [facility]. Follow up appointment scheduled 08/23/17 first available.

## 2017-05-25 NOTE — Telephone Encounter (Signed)
The report was "stuck" in the system in Endo at [facility]. It was printed this morning. It has not been scanned into the system. It has been read.

## 2017-05-25 NOTE — Telephone Encounter (Signed)
Left a message to call back to discuss her capsule study.

## 2017-05-26 ENCOUNTER — Telehealth: Payer: Self-pay | Admitting: Gastroenterology

## 2017-05-26 ENCOUNTER — Other Ambulatory Visit (INDEPENDENT_AMBULATORY_CARE_PROVIDER_SITE_OTHER): Payer: 59

## 2017-05-26 DIAGNOSIS — D500 Iron deficiency anemia secondary to blood loss (chronic): Secondary | ICD-10-CM

## 2017-05-26 LAB — HIGH SENSITIVITY CRP: CRP HIGH SENSITIVITY: 4.71 mg/L (ref 0.000–5.000)

## 2017-05-26 NOTE — Telephone Encounter (Signed)
Patient will take the Entocort in a single daily dose. She was concerned about the potential dizziness that is listed as a possible side effect. She will begin the medication this evening. Patient asks if she should stop her daily low dose ASA? She takes this due to the "high rate of stroke" in her family and states she is a "stroke risk" herself. Thank you

## 2017-05-26 NOTE — Telephone Encounter (Signed)
She will need to discuss with her PMD regarding ASA, if indicated she can continue taking it but if she doesn't have significant benefit then should consider discontinuing the aspirin.  She can take the Budesonide 36m daily at bedtime

## 2017-05-26 NOTE — Telephone Encounter (Signed)
Patient informed of this recommendation. She states she was not instructed by her PMD to take ASA, so she opts to stop it for now.

## 2017-05-27 ENCOUNTER — Telehealth: Payer: Self-pay | Admitting: Gastroenterology

## 2017-05-27 NOTE — Telephone Encounter (Signed)
See result note.  

## 2017-05-31 DIAGNOSIS — R195 Other fecal abnormalities: Secondary | ICD-10-CM

## 2017-06-09 ENCOUNTER — Other Ambulatory Visit: Payer: Self-pay | Admitting: Family Medicine

## 2017-06-09 MED FILL — FLUoxetine HCL 40 MG CAPS: 40 | 30 days supply | Qty: 30 | Fill #0

## 2017-06-14 ENCOUNTER — Telehealth: Payer: Self-pay | Admitting: Hematology

## 2017-06-14 NOTE — Telephone Encounter (Signed)
Patient called to cancel appointment with dr Irene Limbo because she would like to see her gastro dr concerning her hematology issues.

## 2017-06-22 MED FILL — OMEPRAZOLE DR 40 MG CAPSULE: 40 | 90 days supply | Qty: 180 | Fill #1

## 2017-06-22 MED FILL — BUDESONIDE 3 MG CPEP: 3 | 30 days supply | Qty: 90 | Fill #1

## 2017-06-28 ENCOUNTER — Other Ambulatory Visit: Payer: 59

## 2017-06-28 ENCOUNTER — Ambulatory Visit: Payer: 59 | Admitting: Hematology

## 2017-07-01 DIAGNOSIS — H524 Presbyopia: Secondary | ICD-10-CM | POA: Diagnosis not present

## 2017-07-01 DIAGNOSIS — H40013 Open angle with borderline findings, low risk, bilateral: Secondary | ICD-10-CM | POA: Diagnosis not present

## 2017-07-01 DIAGNOSIS — H40053 Ocular hypertension, bilateral: Secondary | ICD-10-CM | POA: Diagnosis not present

## 2017-07-01 DIAGNOSIS — H5213 Myopia, bilateral: Secondary | ICD-10-CM | POA: Diagnosis not present

## 2017-07-11 MED FILL — FLUoxetine HCL 40 MG CAPS: 40 | 30 days supply | Qty: 30 | Fill #1

## 2017-07-14 ENCOUNTER — Other Ambulatory Visit: Payer: Self-pay | Admitting: Family Medicine

## 2017-07-14 DIAGNOSIS — Z1231 Encounter for screening mammogram for malignant neoplasm of breast: Secondary | ICD-10-CM

## 2017-07-21 ENCOUNTER — Encounter: Payer: Self-pay | Admitting: Family Medicine

## 2017-07-21 NOTE — Telephone Encounter (Signed)
Please schedule appt to figure out what referral needed. Thanks.

## 2017-07-22 NOTE — Telephone Encounter (Signed)
Dr. Maudie Mercury, okay for Shingrix?

## 2017-07-26 MED FILL — BUDESONIDE 3 MG CPEP: 3 | 30 days supply | Qty: 90 | Fill #2

## 2017-08-08 ENCOUNTER — Telehealth: Payer: 59 | Admitting: Family

## 2017-08-08 DIAGNOSIS — J208 Acute bronchitis due to other specified organisms: Secondary | ICD-10-CM | POA: Diagnosis not present

## 2017-08-08 MED ORDER — BENZONATATE 100 MG PO CAPS: 100.0000 mg | ORAL_CAPSULE | Freq: Three times a day (TID) | ORAL | 0 refills | Status: DC | PRN

## 2017-08-08 MED ORDER — PREDNISONE 10 MG (21) PO TBPK: ORAL_TABLET | ORAL | 0 refills | Status: DC

## 2017-08-08 MED FILL — FLUoxetine HCL 40 MG CAPS: 40 | 30 days supply | Qty: 30 | Fill #2

## 2017-08-08 MED FILL — PRAVASTATIN SODIUM 20 MG TA: 20 | 90 days supply | Qty: 90 | Fill #1

## 2017-08-08 MED FILL — predniSONE 10 MG TABS: 10 | 6 days supply | Qty: 21 | Fill #0

## 2017-08-08 MED FILL — BENZONATATE 100 MG CAPS: 100 | 8 days supply | Qty: 20 | Fill #0

## 2017-08-08 NOTE — Progress Notes (Signed)
We are sorry that you are not feeling well.  Here is how we plan to help!  Based on your presentation I believe you most likely have A cough due to a virus.  This is called viral bronchitis and is best treated by rest, plenty of fluids and control of the cough.  You may use Ibuprofen or Tylenol as directed to help your symptoms.     In addition you may use A non-prescription cough medication called Robitussin DAC. Take 2 teaspoons every 8 hours or Delsym: take 2 teaspoons every 12 hours., A non-prescription cough medication called Mucinex DM: take 2 tablets every 12 hours. and A prescription cough medication called Tessalon Perles 100mg. You may take 1-2 capsules every 8 hours as needed for your cough.  Prednisone 10 mg daily for 6 days (see taper instructions below)  Directions for 6 day taper: Day 1: 2 tablets before breakfast, 1 after both lunch & dinner and 2 at bedtime Day 2: 1 tab before breakfast, 1 after both lunch & dinner and 2 at bedtime Day 3: 1 tab at each meal & 1 at bedtime Day 4: 1 tab at breakfast, 1 at lunch, 1 at bedtime Day 5: 1 tab at breakfast & 1 tab at bedtime Day 6: 1 tab at breakfast   From your responses in the eVisit questionnaire you describe inflammation in the upper respiratory tract which is causing a significant cough.  This is commonly called Bronchitis and has four common causes:    Allergies  Viral Infections  Acid Reflux  Bacterial Infection Allergies, viruses and acid reflux are treated by controlling symptoms or eliminating the cause. An example might be a cough caused by taking certain blood pressure medications. You stop the cough by changing the medication. Another example might be a cough caused by acid reflux. Controlling the reflux helps control the cough.  USE OF BRONCHODILATOR ("RESCUE") INHALERS: There is a risk from using your bronchodilator too frequently.  The risk is that over-reliance on a medication which only relaxes the muscles  surrounding the breathing tubes can reduce the effectiveness of medications prescribed to reduce swelling and congestion of the tubes themselves.  Although you feel brief relief from the bronchodilator inhaler, your asthma may actually be worsening with the tubes becoming more swollen and filled with mucus.  This can delay other crucial treatments, such as oral steroid medications. If you need to use a bronchodilator inhaler daily, several times per day, you should discuss this with your provider.  There are probably better treatments that could be used to keep your asthma under control.     HOME CARE . Only take medications as instructed by your medical team. . Complete the entire course of an antibiotic. . Drink plenty of fluids and get plenty of rest. . Avoid close contacts especially the very young and the elderly . Cover your mouth if you cough or cough into your sleeve. . Always remember to wash your hands . A steam or ultrasonic humidifier can help congestion.   GET HELP RIGHT AWAY IF: . You develop worsening fever. . You become short of breath . You cough up blood. . Your symptoms persist after you have completed your treatment plan MAKE SURE YOU   Understand these instructions.  Will watch your condition.  Will get help right away if you are not doing well or get worse.  Your e-visit answers were reviewed by a board certified advanced clinical practitioner to complete your personal care plan.    Depending on the condition, your plan could have included both over the counter or prescription medications. If there is a problem please reply  once you have received a response from your provider. Your safety is important to us.  If you have drug allergies check your prescription carefully.    You can use MyChart to ask questions about today's visit, request a non-urgent call back, or ask for a work or school excuse for 24 hours related to this e-Visit. If it has been greater than 24  hours you will need to follow up with your provider, or enter a new e-Visit to address those concerns. You will get an e-mail in the next two days asking about your experience.  I hope that your e-visit has been valuable and will speed your recovery. Thank you for using e-visits.   

## 2017-08-10 ENCOUNTER — Ambulatory Visit (INDEPENDENT_AMBULATORY_CARE_PROVIDER_SITE_OTHER): Payer: 59 | Admitting: *Deleted

## 2017-08-10 DIAGNOSIS — Z23 Encounter for immunization: Secondary | ICD-10-CM

## 2017-08-10 NOTE — Progress Notes (Signed)
Per orders of Dr. Maudie Mercury, injection of Shingrix given by Dorrene German. Patient tolerated injection well.

## 2017-08-23 ENCOUNTER — Encounter: Payer: Self-pay | Admitting: Gastroenterology

## 2017-08-23 ENCOUNTER — Other Ambulatory Visit (INDEPENDENT_AMBULATORY_CARE_PROVIDER_SITE_OTHER): Payer: 59

## 2017-08-23 ENCOUNTER — Ambulatory Visit: Payer: 59 | Admitting: Gastroenterology

## 2017-08-23 VITALS — BP 128/78 | HR 64 | Ht 62.0 in | Wt 160.2 lb

## 2017-08-23 DIAGNOSIS — D508 Other iron deficiency anemias: Secondary | ICD-10-CM

## 2017-08-23 DIAGNOSIS — K5000 Crohn's disease of small intestine without complications: Secondary | ICD-10-CM | POA: Diagnosis not present

## 2017-08-23 LAB — IBC PANEL
Iron: 65 ug/dL (ref 42–145)
Saturation Ratios: 15.7 % — ABNORMAL LOW (ref 20.0–50.0)
Transferrin: 296 mg/dL (ref 212.0–360.0)

## 2017-08-23 LAB — FERRITIN: FERRITIN: 18.4 ng/mL (ref 10.0–291.0)

## 2017-08-23 LAB — FOLATE: Folate: 15.4 ng/mL

## 2017-08-23 LAB — VITAMIN B12: VITAMIN B 12: 435 pg/mL (ref 211–911)

## 2017-08-23 NOTE — Patient Instructions (Signed)
Go to the basement for labs today   Stop Budesonide   If you are age 54 or older, your body mass index should be between 23-30. Your Body mass index is 29.3 kg/m. If this is out of the aforementioned range listed, please consider follow up with your Primary Care Provider.  If you are age 93 or younger, your body mass index should be between 19-25. Your Body mass index is 29.3 kg/m. If this is out of the aformentioned range listed, please consider follow up with your Primary Care Provider.    Thank you for choosing [facility]  Karleen Hampshire Nandigam,MD

## 2017-08-23 NOTE — Progress Notes (Signed)
Rachael Hodge    [id number]    18-Nov-1965  Primary Care Physician:Kim, Nickola Major, DO  Referring Physician: Lucretia Kern, DO [address] [place], [place] [address]  Chief complaint: Iron deficiency anemia  HPI: 52 yr F with iron deficiency anemia here for follow up visit.  EGD 04/29/2017: small hiatal hernia, minimal gastritis. H.pylori negative Colonoscopy 04/29/2017:small hyperplastic polyps, internal hemorrhoids otherwise normal Small bowel video capsule 05/09/17 scattered erythema and erosions in the terminal ileum CRP was normal.  Empirically treated with 22-month course of budesonide 9 mg daily.  Patient reports significant improvement with increased energy level.  She stopped taking low dose aspirin.  Denies use of any NSAIDs or over-the-counter herbal remedies. She is currently not taking oral iron.  She had reaction to IV iron infusion. Complains of episodes of fecal incontinence, passes small amount of formed stool when she urinates and also has seepage after intense exercise.  She has been having incomplete evacuation with hard stool for past few months.  On most days she passes small amount of formed stool when ever she urinates. Denies any abdominal pain, melena, dark stool or blood per rectum  Outpatient Encounter Medications as of 08/23/2017  Medication Sig  . amLODipine (NORVASC) 5 MG tablet TAKE 1 TABLET BY MOUTH DAILY.  . budesonide (ENTOCORT EC) 3 MG 24 hr capsule Take 3 capsules (9 mg total) by mouth daily.  Stasia Cavalier (EUCRISA) 2 % OINT Apply 1 application topically 2 (two) times daily.  . Fish Oil OIL 1,200 mg daily.   [name] FLUoxetine (PROZAC) 40 MG capsule TAKE 1 CAPSULE BY MOUTH DAILY.  . hydrOXYzine (ATARAX/VISTARIL) 25 MG tablet Take 1 tablet at bedtime as needed for itching  . Multiple Vitamin (MULTIVITAMIN) capsule Take 1 capsule by mouth daily.  [name] omeprazole (PRILOSEC) 40 MG capsule Take 1 capsule (40 mg total) by mouth 2 (two) times daily  before a meal.  . pimecrolimus (ELIDEL) 1 % cream Apply topically 2 (two) times daily.  . pravastatin (PRAVACHOL) 20 MG tablet Take 1 tablet (20 mg total) by mouth daily.  . Probiotic Product (PROBIOTIC-10 PO) Take by mouth.  . [DISCONTINUED] aspirin 81 MG tablet Take 81 mg by mouth daily.  . [DISCONTINUED] benzonatate (TESSALON PERLES) 100 MG capsule Take 1 capsule (100 mg total) by mouth 3 (three) times daily as needed.  . [DISCONTINUED] FLUoxetine (PROZAC) 40 MG capsule TAKE 1 CAPSULE BY MOUTH ONE TIME A DAY   . [DISCONTINUED] iron polysaccharides (NIFEREX) 150 MG capsule Take 1 capsule (150 mg total) by mouth daily.  . [DISCONTINUED] predniSONE (STERAPRED UNI-PAK 21 TAB) 10 MG (21) TBPK tablet Use as directed   Facility-Administered Encounter Medications as of 08/23/2017  Medication  . 0.9 %  sodium chloride infusion    Allergies as of 08/23/2017 - Review Complete 08/23/2017  Allergen Reaction Noted  . Penicillins Shortness Of Breath 08/02/2013  . Sulfa antibiotics Shortness Of Breath 08/02/2013  . Feraheme [ferumoxytol] Nausea Only and Other (See Comments) 09/28/2016    Past Medical History:  Diagnosis Date  . Anemia   . Depression   . Eczema   . GERD (gastroesophageal reflux disease)   . Hyperlipidemia   . Hypertension   . Urticaria     Past Surgical History:  Procedure Laterality Date  . COLPOSCOPY  2006  . GIVENS CAPSULE STUDY N/A 05/09/2017   Procedure: GIVENS CAPSULE STUDY;  Surgeon: Mauri Pole, MD;  Location:  [facility];  Service: Endoscopy;  Laterality: N/A;    Family History  Problem Relation Age of Onset  . Heart disease Father   . Hyperlipidemia Father   . Hypertension Father   . Stroke Father   . Heart disease Paternal Grandfather   . Crohn's disease Son   . Breast cancer Neg Hx   . Colon cancer Neg Hx     Social History   Socioeconomic History  . Marital status: Married    Spouse name: Not on file  . Number of children: Not on file  .  Years of education: Not on file  . Highest education level: Not on file  Occupational History  . Not on file  Social Needs  . Financial resource strain: Not on file  . Food insecurity:    Worry: Not on file    Inability: Not on file  . Transportation needs:    Medical: Not on file    Non-medical: Not on file  Tobacco Use  . Smoking status: Never Smoker  . Smokeless tobacco: Never Used  Substance and Sexual Activity  . Alcohol use: Yes    Alcohol/week: 1.2 oz    Types: 2 Cans of beer per week  . Drug use: No  . Sexual activity: Yes    Partners: Male    Birth control/protection: Post-menopausal  Lifestyle  . Physical activity:    Days per week: Not on file    Minutes per session: Not on file  . Stress: Not on file  Relationships  . Social connections:    Talks on phone: Not on file    Gets together: Not on file    Attends religious service: Not on file    Active member of club or organization: Not on file    Attends meetings of clubs or organizations: Not on file    Relationship status: Not on file  . Intimate partner violence:    Fear of current or ex partner: Not on file    Emotionally abused: Not on file    Physically abused: Not on file    Forced sexual activity: Not on file  Other Topics Concern  . Not on file  Social History Narrative   Work or School: Publishing copy Situation: lives with boyfriend      Spiritual Beliefs: Christian      Lifestyle: exercising, trying to eat well               Review of systems: Review of Systems  Constitutional: Negative for fever and chills.  HENT: Negative.   Eyes: Negative for blurred vision.  Respiratory: Negative for cough, shortness of breath and wheezing.   Cardiovascular: Negative for chest pain and palpitations.  Gastrointestinal: as per HPI Genitourinary: Negative for dysuria, urgency, frequency and hematuria.  Musculoskeletal: Negative for myalgias, back pain and  joint pain.  Skin: Negative for itching and rash.  Neurological: Negative for dizziness, tremors, focal weakness, seizures and loss of consciousness.  Endo/Heme/Allergies: Positive for seasonal allergies.  Psychiatric/Behavioral: Negative for depression, suicidal ideas and hallucinations.  All other systems reviewed and are negative.   Physical Exam: Vitals:   08/23/17 1458  BP: 128/78  Pulse: 64   Body mass index is 29.3 kg/m. Gen:      No acute distress HEENT:  EOMI, sclera anicteric Neck:     No masses; no thyromegaly Lungs:    Clear to auscultation bilaterally; normal respiratory effort CV:  Regular rate and rhythm; no murmurs Abd:      + bowel sounds; soft, non-tender; no palpable masses, no distension Ext:    No edema; adequate peripheral perfusion Skin:      Warm and dry; no rash Neuro: alert and oriented x 3 Psych: normal mood and affect  Data Reviewed:  Reviewed labs, radiology imaging, old records and pertinent past GI work up   Assessment and Plan/Recommendations:  52 year old female with iron deficiency anemia, likely source small-volume chronic GI blood loss Mild gastritis and small bowel erosions Empirically treated with budesonide for 3 months for possible Crohn's ileitis but given lack of any specific GI symptoms will hold off additional therapy Recheck CBC, iron panel, ferritin, B12 and folate Will likely need monitoring every 3 to 6 months Advised patient to continue oral iron supplements and avoid NSAIDs  Colorectal cancer screening average risk due for screening in March 2029  25 minutes was spent face-to-face with the patient. Greater than 50% of the time used for counseling as well as treatment plan and follow-up. She had multiple questions which were answered to her satisfaction  K. Denzil Magnuson , MD [phone]    CC: Lucretia Kern, DO

## 2017-08-29 ENCOUNTER — Ambulatory Visit
Admission: RE | Admit: 2017-08-29 | Discharge: 2017-08-29 | Disposition: A | Discharge status: Home / Self Care | Payer: 59 | Source: Ambulatory Visit | Attending: Family Medicine | Admitting: Family Medicine

## 2017-08-29 ENCOUNTER — Other Ambulatory Visit: Payer: Self-pay

## 2017-08-29 ENCOUNTER — Encounter: Payer: Self-pay | Admitting: Gastroenterology

## 2017-08-29 DIAGNOSIS — Z1231 Encounter for screening mammogram for malignant neoplasm of breast: Secondary | ICD-10-CM | POA: Diagnosis not present

## 2017-08-29 DIAGNOSIS — D508 Other iron deficiency anemias: Secondary | ICD-10-CM

## 2017-08-31 DIAGNOSIS — N951 Menopausal and female climacteric states: Secondary | ICD-10-CM | POA: Diagnosis not present

## 2017-08-31 DIAGNOSIS — R635 Abnormal weight gain: Secondary | ICD-10-CM | POA: Diagnosis not present

## 2017-09-01 DIAGNOSIS — K219 Gastro-esophageal reflux disease without esophagitis: Secondary | ICD-10-CM | POA: Diagnosis not present

## 2017-09-01 DIAGNOSIS — Z1339 Encounter for screening examination for other mental health and behavioral disorders: Secondary | ICD-10-CM | POA: Diagnosis not present

## 2017-09-01 DIAGNOSIS — E663 Overweight: Secondary | ICD-10-CM | POA: Diagnosis not present

## 2017-09-01 DIAGNOSIS — R5383 Other fatigue: Secondary | ICD-10-CM | POA: Diagnosis not present

## 2017-09-01 DIAGNOSIS — E782 Mixed hyperlipidemia: Secondary | ICD-10-CM | POA: Diagnosis not present

## 2017-09-01 DIAGNOSIS — R6882 Decreased libido: Secondary | ICD-10-CM | POA: Diagnosis not present

## 2017-09-01 DIAGNOSIS — N898 Other specified noninflammatory disorders of vagina: Secondary | ICD-10-CM | POA: Diagnosis not present

## 2017-09-01 DIAGNOSIS — F329 Major depressive disorder, single episode, unspecified: Secondary | ICD-10-CM | POA: Diagnosis not present

## 2017-09-01 DIAGNOSIS — I10 Essential (primary) hypertension: Secondary | ICD-10-CM | POA: Diagnosis not present

## 2017-09-01 DIAGNOSIS — R232 Flushing: Secondary | ICD-10-CM | POA: Diagnosis not present

## 2017-09-01 DIAGNOSIS — Z1331 Encounter for screening for depression: Secondary | ICD-10-CM | POA: Diagnosis not present

## 2017-09-05 DIAGNOSIS — E663 Overweight: Secondary | ICD-10-CM | POA: Diagnosis not present

## 2017-09-05 DIAGNOSIS — Z713 Dietary counseling and surveillance: Secondary | ICD-10-CM | POA: Diagnosis not present

## 2017-09-05 DIAGNOSIS — E782 Mixed hyperlipidemia: Secondary | ICD-10-CM | POA: Diagnosis not present

## 2017-09-06 ENCOUNTER — Telehealth: Payer: Self-pay | Admitting: Hematology

## 2017-09-06 NOTE — Telephone Encounter (Signed)
Patient lm to cancel appt. Will call back to r/s

## 2017-09-08 ENCOUNTER — Other Ambulatory Visit: Payer: Self-pay | Admitting: Family Medicine

## 2017-09-08 MED FILL — FLUoxetine HCL 40 MG CAPS: 40 | 30 days supply | Qty: 30 | Fill #3

## 2017-09-08 MED FILL — AMLODIPINE BESYLATE 5 MG TA: 5 | 90 days supply | Qty: 90 | Fill #0

## 2017-09-12 DIAGNOSIS — I10 Essential (primary) hypertension: Secondary | ICD-10-CM | POA: Diagnosis not present

## 2017-09-12 DIAGNOSIS — E663 Overweight: Secondary | ICD-10-CM | POA: Diagnosis not present

## 2017-09-12 DIAGNOSIS — Z713 Dietary counseling and surveillance: Secondary | ICD-10-CM | POA: Diagnosis not present

## 2017-09-14 ENCOUNTER — Telehealth: Payer: Self-pay

## 2017-09-14 NOTE — Telephone Encounter (Signed)
Called patient and left a detail voice message concerning her upcoming appointment. Per 8/13 sch msg and also mailed a letter with a calender enclosed.

## 2017-09-14 NOTE — Progress Notes (Signed)
HPI:  Using dictation device. Unfortunately this device frequently misinterprets words/phrases.  Rachael Hodge is a pleasant 52 y.o. here for follow up. Chronic medical problems summarized below were reviewed for changes. BS and cholesterol more elevated at last check 05/2017. Statin was added and lifestyle recs were reiterated.  Reports she is doing well.  Mood has been good.  She had an extensive work-up with the gastroenterologist and had an ulcer.  She was treated with several months of Entocort and now is off of this.  She is plugged in with [facility] for assistance with weight reduction.  She has opted out of the supplements and hormones, but is doing the diet portion of the program.  She is working on a low sugar, healthy diet and started to get regular exercise.  She just started this 2 weeks ago and has lost over a pound already. Denies CP, SOB, DOE, treatment intolerance or new symptoms.  CPE 05/17/17 Depression: -on prozac 40mg  daily -reports: doing well on the medication -PHQ9 0  HTN: -meds: amlodipine  Borderline diabetes/HLD: -meds: pravastatin added 05/2017  Leukopenia/iron def anemia: -s/p evaluation with hematology in 2018, felt lymphopenia related to allergies or virus per notes, no further eval advised -had eval with GI, endoscopy, capsule endoscopy - on entocort for several months for ulcer -seeing Dr. Irene Limbo about the iron def anemia   ROS: See pertinent positives and negatives per HPI.  Past Medical History:  Diagnosis Date  . Anemia   . Depression   . Eczema   . GERD (gastroesophageal reflux disease)   . Hyperlipidemia   . Hypertension   . Urticaria     Past Surgical History:  Procedure Laterality Date  . COLPOSCOPY  2006  . GIVENS CAPSULE STUDY N/A 05/09/2017   Procedure: GIVENS CAPSULE STUDY;  Surgeon: Mauri Pole, MD;  Location: [facility];  Service: Endoscopy;  Laterality: N/A;    Family History  Problem Relation Age of Onset  .  Heart disease Father   . Hyperlipidemia Father   . Hypertension Father   . Stroke Father   . Heart disease Paternal Grandfather   . Crohn's disease Son   . Breast cancer Neg Hx   . Colon cancer Neg Hx     SOCIAL HX: see hpi   Current Outpatient Medications:  .  amLODipine (NORVASC) 5 MG tablet, TAKE 1 TABLET BY MOUTH DAILY., Disp: 90 tablet, Rfl: 1 .  budesonide (ENTOCORT EC) 3 MG 24 hr capsule, Take 3 capsules (9 mg total) by mouth daily., Disp: 90 capsule, Rfl: 3 .  Crisaborole (EUCRISA) 2 % OINT, Apply 1 application topically 2 (two) times daily., Disp: 60 g, Rfl: 3 .  Fish Oil OIL, 1,200 mg daily. , Disp: , Rfl:  .  FLUoxetine (PROZAC) 40 MG capsule, TAKE 1 CAPSULE BY MOUTH DAILY., Disp: 30 capsule, Rfl: 5 .  hydrOXYzine (ATARAX/VISTARIL) 25 MG tablet, Take 1 tablet at bedtime as needed for itching, Disp: 30 tablet, Rfl: 3 .  Multiple Vitamin (MULTIVITAMIN) capsule, Take 1 capsule by mouth daily., Disp: , Rfl:  .  omeprazole (PRILOSEC) 40 MG capsule, Take 1 capsule (40 mg total) by mouth 2 (two) times daily before a meal., Disp: 180 capsule, Rfl: 3 .  pimecrolimus (ELIDEL) 1 % cream, Apply topically 2 (two) times daily., Disp: 30 g, Rfl: 3 .  pravastatin (PRAVACHOL) 20 MG tablet, Take 1 tablet (20 mg total) by mouth daily., Disp: 90 tablet, Rfl: 1 .  Probiotic Product (PROBIOTIC-10  PO), Take by mouth., Disp: , Rfl:   Current Facility-Administered Medications:  .  0.9 %  sodium chloride infusion, 500 mL, Intravenous, Once, Nandigam, Kavitha V, MD  EXAM:  Vitals:   09/15/17 0745  BP: 102/78  Pulse: 86  Temp: 98.3 F (36.8 C)    Body mass index is 29.14 kg/m.  GENERAL: vitals reviewed and listed above, alert, oriented, appears well hydrated and in no acute distress  HEENT: atraumatic, conjunttiva clear, no obvious abnormalities on inspection of external nose and ears  NECK: no obvious masses on inspection  LUNGS: clear to auscultation bilaterally, no wheezes, rales or  rhonchi, good air movement  CV: HRRR, no peripheral edema  MS: moves all extremities without noticeable abnormality  PSYCH: pleasant and cooperative, no obvious depression or anxiety  ASSESSMENT AND PLAN:  Discussed the following assessment and plan:  Essential hypertension - Plan: Basic metabolic panel, CBC  Hyperlipidemia, unspecified hyperlipidemia type - Plan: Lipid panel  Hyperglycemia - Plan: Hemoglobin A1c  Major depressive disorder with single episode, in full remission ([place])  Overweight with body mass index (BMI) 25.0-29.9  -congratulated on lifestyle changes and encouraged a lifelong commitment to a healthy diet and regular exercise -Labs per orders, as she wants a baseline to see the change as she continues on the diet plan and regular exercise, see orders -Seeing GI and hematology for the low iron -Plans to do flu shot in October -Seeing gynecology for Paps -follow up 3-4 months   Patient Instructions  BEFORE YOU LEAVE: -labs -follow up: 3-4 months  Flu shot in October - let Wendie Simmer know if you wish to do it here.  We have ordered labs or studies at this visit. It can take up to 1-2 weeks for results and processing. IF results require follow up or explanation, we will call you with instructions. Clinically stable results will be released to your [facility]. If you have not heard from [place] or cannot find your results in [facility] in 2 weeks please contact our office at [phone].  If you are not yet signed up for [facility], please consider signing up.   We recommend the following healthy lifestyle for LIFE: 1) Small portions. But, make sure to get regular (at least 3 per day), healthy meals and small healthy snacks if needed.  2) Eat a healthy clean diet.   TRY TO EAT: -at least 5-7 servings of low sugar, colorful, and nutrient rich vegetables per day (not corn, potatoes or bananas.) -berries are the best choice if you wish to eat fruit (only eat small amounts if  trying to reduce weight)  -lean meets (fish, white meat of chicken or [place]) -vegan proteins for some meals - beans or tofu, whole grains, nuts and seeds -Replace bad fats with good fats - good fats include: fish, nuts and seeds, canola oil, olive oil -small amounts of low fat or non fat dairy -small amounts of100 % whole grains - check the lables -drink plenty of water  AVOID: -SUGAR, sweets, anything with added sugar, corn syrup or sweeteners - must read labels as even foods advertised as "healthy" often are loaded with sugar -if you must have a sweetener, small amounts of stevia may be best -sweetened beverages and artificially sweetened beverages -simple starches (rice, bread, potatoes, pasta, chips, etc - small amounts of 100% whole grains are ok) -red meat, pork, butter -fried foods, fast food, processed food, excessive dairy, eggs and coconut.  3)Get at least 150 minutes of sweaty aerobic  exercise per week.  4)Reduce stress - consider counseling, meditation and relaxation to balance other aspects of your life.          Lucretia Kern, DO

## 2017-09-15 ENCOUNTER — Encounter: Payer: Self-pay | Admitting: Family Medicine

## 2017-09-15 ENCOUNTER — Telehealth: Payer: Self-pay | Admitting: Hematology

## 2017-09-15 ENCOUNTER — Ambulatory Visit: Payer: 59 | Admitting: Family Medicine

## 2017-09-15 VITALS — BP 102/78 | HR 86 | Temp 98.3°F | Ht 62.0 in | Wt 159.3 lb

## 2017-09-15 DIAGNOSIS — F325 Major depressive disorder, single episode, in full remission: Secondary | ICD-10-CM

## 2017-09-15 DIAGNOSIS — R739 Hyperglycemia, unspecified: Secondary | ICD-10-CM

## 2017-09-15 DIAGNOSIS — I10 Essential (primary) hypertension: Secondary | ICD-10-CM

## 2017-09-15 DIAGNOSIS — E663 Overweight: Secondary | ICD-10-CM

## 2017-09-15 DIAGNOSIS — E785 Hyperlipidemia, unspecified: Secondary | ICD-10-CM

## 2017-09-15 LAB — CBC
HCT: 37.7 % (ref 36.0–46.0)
Hemoglobin: 12.8 g/dL (ref 12.0–15.0)
MCHC: 33.9 g/dL (ref 30.0–36.0)
MCV: 82.7 fl (ref 78.0–100.0)
Platelets: 298 10*3/uL (ref 150.0–400.0)
RBC: 4.56 Mil/uL (ref 3.87–5.11)
RDW: 15 % (ref 11.5–15.5)
WBC: 3.5 10*3/uL — ABNORMAL LOW (ref 4.0–10.5)

## 2017-09-15 LAB — BASIC METABOLIC PANEL
BUN: 15 mg/dL (ref 6–23)
CO2: 29 meq/L (ref 19–32)
Calcium: 9.8 mg/dL (ref 8.4–10.5)
Chloride: 102 mEq/L (ref 96–112)
Creatinine, Ser: 0.94 mg/dL (ref 0.40–1.20)
GFR: 65.8 mL/min (ref >60.00)
GLUCOSE: 88 mg/dL (ref 70–99)
POTASSIUM: 4.8 meq/L (ref 3.5–5.1)
SODIUM: 138 meq/L (ref 135–145)

## 2017-09-15 LAB — LIPID PANEL
CHOL/HDL RATIO: 3
CHOLESTEROL: 219 mg/dL — AB (ref 0–200)
HDL: 80.9 mg/dL (ref >39.00)
LDL Cholesterol: 119 mg/dL — ABNORMAL HIGH (ref 0–99)
NonHDL: 137.78
TRIGLYCERIDES: 94 mg/dL (ref 0.0–149.0)
VLDL: 18.8 mg/dL (ref 0.0–40.0)

## 2017-09-15 LAB — HEMOGLOBIN A1C: Hgb A1c MFr Bld: 6.4 % (ref 4.6–6.5)

## 2017-09-15 NOTE — Telephone Encounter (Signed)
Patient called to cancel °

## 2017-09-15 NOTE — Patient Instructions (Signed)
BEFORE YOU LEAVE: -labs -follow up: 3-4 months  Flu shot in October - let Wendie Simmer know if you wish to do it here.  We have ordered labs or studies at this visit. It can take up to 1-2 weeks for results and processing. IF results require follow up or explanation, we will call you with instructions. Clinically stable results will be released to your [facility]. If you have not heard from [place] or cannot find your results in [facility] in 2 weeks please contact our office at [phone].  If you are not yet signed up for [facility], please consider signing up.   We recommend the following healthy lifestyle for LIFE: 1) Small portions. But, make sure to get regular (at least 3 per day), healthy meals and small healthy snacks if needed.  2) Eat a healthy clean diet.   TRY TO EAT: -at least 5-7 servings of low sugar, colorful, and nutrient rich vegetables per day (not corn, potatoes or bananas.) -berries are the best choice if you wish to eat fruit (only eat small amounts if trying to reduce weight)  -lean meets (fish, white meat of chicken or [place]) -vegan proteins for some meals - beans or tofu, whole grains, nuts and seeds -Replace bad fats with good fats - good fats include: fish, nuts and seeds, canola oil, olive oil -small amounts of low fat or non fat dairy -small amounts of100 % whole grains - check the lables -drink plenty of water  AVOID: -SUGAR, sweets, anything with added sugar, corn syrup or sweeteners - must read labels as even foods advertised as "healthy" often are loaded with sugar -if you must have a sweetener, small amounts of stevia may be best -sweetened beverages and artificially sweetened beverages -simple starches (rice, bread, potatoes, pasta, chips, etc - small amounts of 100% whole grains are ok) -red meat, pork, butter -fried foods, fast food, processed food, excessive dairy, eggs and coconut.  3)Get at least 150 minutes of sweaty aerobic exercise per week.  4)Reduce  stress - consider counseling, meditation and relaxation to balance other aspects of your life.

## 2017-09-19 ENCOUNTER — Ambulatory Visit: Payer: 59 | Admitting: Hematology

## 2017-09-19 DIAGNOSIS — E663 Overweight: Secondary | ICD-10-CM | POA: Diagnosis not present

## 2017-09-19 DIAGNOSIS — Z713 Dietary counseling and surveillance: Secondary | ICD-10-CM | POA: Diagnosis not present

## 2017-09-19 DIAGNOSIS — E782 Mixed hyperlipidemia: Secondary | ICD-10-CM | POA: Diagnosis not present

## 2017-10-06 MED FILL — FLUoxetine HCL 40 MG CAPS: 40 | 30 days supply | Qty: 30 | Fill #4

## 2017-10-11 ENCOUNTER — Ambulatory Visit: Payer: 59 | Admitting: Hematology

## 2017-10-11 ENCOUNTER — Other Ambulatory Visit: Payer: 59

## 2017-10-13 ENCOUNTER — Ambulatory Visit (INDEPENDENT_AMBULATORY_CARE_PROVIDER_SITE_OTHER): Payer: 59

## 2017-10-13 DIAGNOSIS — Z23 Encounter for immunization: Secondary | ICD-10-CM

## 2017-10-13 NOTE — Progress Notes (Signed)
Per orders of Dr. Colin Benton, injection of shingrix 0.34ml given by Rebecca Eaton. Patient tolerated injection well.

## 2017-10-26 ENCOUNTER — Encounter: Payer: Self-pay | Admitting: Gastroenterology

## 2017-10-26 ENCOUNTER — Ambulatory Visit: Payer: 59 | Admitting: Gastroenterology

## 2017-10-26 VITALS — BP 120/74 | HR 70 | Ht 62.0 in | Wt 156.0 lb

## 2017-10-26 DIAGNOSIS — K633 Ulcer of intestine: Secondary | ICD-10-CM | POA: Diagnosis not present

## 2017-10-26 DIAGNOSIS — D500 Iron deficiency anemia secondary to blood loss (chronic): Secondary | ICD-10-CM

## 2017-10-26 NOTE — Patient Instructions (Addendum)
Continue to monitor iron level through your primary care doctor every 6 months   Continue oral iron   Follow up as needed  If you are age 54 or older, your body mass index should be between 23-30. Your Body mass index is 28.53 kg/m. If this is out of the aforementioned range listed, please consider follow up with your Primary Care Provider.  If you are age 68 or younger, your body mass index should be between 19-25. Your Body mass index is 28.53 kg/m. If this is out of the aformentioned range listed, please consider follow up with your Primary Care Provider.    Thank you for choosing [facility]  Karleen Hampshire Nandigam,MD

## 2017-10-31 ENCOUNTER — Encounter: Payer: Self-pay | Admitting: Gastroenterology

## 2017-10-31 NOTE — Progress Notes (Signed)
Rachael Hodge    [id number]    [birth year]  Primary Care Physician:Kim, Nickola Major, DO  Referring Physician: Lucretia Kern, DO [address] [place], [place] [address]  Chief complaint: Iron deficiency anemia HPI:  52 year old female with history of iron deficiency anemia here for follow-up visit.  She is doing well overall.  Denies any nausea, vomiting, abdominal pain, melena, dark stool or blood per rectum.  She is taking oral iron supplements. Iron studies August 23, 2017 showed iron level 65, ferritin 18.4 and normal hemoglobin 12.8.  She is feeling well overall and feels her energy level is back to baseline.  She is not on any NSAIDs, also stopped low-dose aspirin that she was taking before.  No history of CAD.  Relevant GI history:  EGD 04/29/2017: small hiatal hernia, minimal gastritis. H.pylori negative Colonoscopy 04/29/2017:small hyperplastic polyps, internal hemorrhoids otherwise normal Small bowel video capsule 05/09/17 scattered erythema and erosions in the terminal ileum CRP was normal.  Empirically treated with 44-month course of budesonide 9 mg daily.  Patient reports significant improvement with increased energy level.  Outpatient Encounter Medications as of 10/26/2017  Medication Sig  . amLODipine (NORVASC) 5 MG tablet TAKE 1 TABLET BY MOUTH DAILY.  Stasia Cavalier (EUCRISA) 2 % OINT Apply 1 application topically 2 (two) times daily.  . Fish Oil OIL 1,200 mg daily.   [name] FLUoxetine (PROZAC) 40 MG capsule TAKE 1 CAPSULE BY MOUTH DAILY.  . hydrOXYzine (ATARAX/VISTARIL) 25 MG tablet Take 1 tablet at bedtime as needed for itching  . Multiple Vitamin (MULTIVITAMIN) capsule Take 1 capsule by mouth daily.  [name] omeprazole (PRILOSEC) 40 MG capsule Take 1 capsule (40 mg total) by mouth 2 (two) times daily before a meal.  . pimecrolimus (ELIDEL) 1 % cream Apply topically 2 (two) times daily.  . pravastatin (PRAVACHOL) 20 MG tablet Take 1 tablet (20 mg total) by mouth  daily.  . Probiotic Product (PROBIOTIC-10 PO) Take by mouth.  . [DISCONTINUED] budesonide (ENTOCORT EC) 3 MG 24 hr capsule Take 3 capsules (9 mg total) by mouth daily.  . [DISCONTINUED] 0.9 %  sodium chloride infusion    No facility-administered encounter medications on file as of 10/26/2017.     Allergies as of 10/26/2017 - Review Complete 10/26/2017  Allergen Reaction Noted  . Penicillins Shortness Of Breath 08/02/2013  . Sulfa antibiotics Shortness Of Breath 08/02/2013  . Feraheme [ferumoxytol] Nausea Only and Other (See Comments) 09/28/2016    Past Medical History:  Diagnosis Date  . Anemia   . Depression   . Eczema   . GERD (gastroesophageal reflux disease)   . Hyperlipidemia   . Hypertension   . Urticaria     Past Surgical History:  Procedure Laterality Date  . COLPOSCOPY  2006  . GIVENS CAPSULE STUDY N/A 05/09/2017   Procedure: GIVENS CAPSULE STUDY;  Surgeon: Mauri Pole, MD;  Location: [facility];  Service: Endoscopy;  Laterality: N/A;    Family History  Problem Relation Age of Onset  . Heart disease Father   . Hyperlipidemia Father   . Hypertension Father   . Stroke Father   . Heart disease Paternal Grandfather   . Crohn's disease Son   . Breast cancer Neg Hx   . Colon cancer Neg Hx     Social History   Socioeconomic History  . Marital status: Married    Spouse name: Not on file  . Number of children: Not  on file  . Years of education: Not on file  . Highest education level: Not on file  Occupational History  . Not on file  Social Needs  . Financial resource strain: Not on file  . Food insecurity:    Worry: Not on file    Inability: Not on file  . Transportation needs:    Medical: Not on file    Non-medical: Not on file  Tobacco Use  . Smoking status: Never Smoker  . Smokeless tobacco: Never Used  Substance and Sexual Activity  . Alcohol use: Yes    Alcohol/week: 2.0 standard drinks    Types: 2 Cans of beer per week  . Drug use: No   . Sexual activity: Yes    Partners: Male    Birth control/protection: Post-menopausal  Lifestyle  . Physical activity:    Days per week: Not on file    Minutes per session: Not on file  . Stress: Not on file  Relationships  . Social connections:    Talks on phone: Not on file    Gets together: Not on file    Attends religious service: Not on file    Active member of club or organization: Not on file    Attends meetings of clubs or organizations: Not on file    Relationship status: Not on file  . Intimate partner violence:    Fear of current or ex partner: Not on file    Emotionally abused: Not on file    Physically abused: Not on file    Forced sexual activity: Not on file  Other Topics Concern  . Not on file  Social History Narrative   Work or School: Publishing copy Situation: lives with boyfriend      Spiritual Beliefs: Christian      Lifestyle: exercising, trying to eat well               Review of systems: Review of Systems  Constitutional: Negative for fever and chills.  HENT: Negative.   Eyes: Negative for blurred vision.  Respiratory: Negative for cough, shortness of breath and wheezing.   Cardiovascular: Negative for chest pain and palpitations.  Gastrointestinal: as per HPI Genitourinary: Negative for dysuria, urgency, frequency and hematuria.  Musculoskeletal: Negative for myalgias, back pain and joint pain.  Skin: Negative for itching and rash.  Neurological: Negative for dizziness, tremors, focal weakness, seizures and loss of consciousness.  Endo/Heme/Allergies: Positive for seasonal allergies.  Psychiatric/Behavioral: Negative for depression, suicidal ideas and hallucinations.  All other systems reviewed and are negative.   Physical Exam: Vitals:   10/26/17 1026  BP: 120/74  Pulse: 70   Body mass index is 28.53 kg/m. Gen:      No acute distress HEENT:  EOMI, sclera anicteric Neck:     No masses; no  thyromegaly Lungs:    Clear to auscultation bilaterally; normal respiratory effort CV:         Regular rate and rhythm; no murmurs Abd:      + bowel sounds; soft, non-tender; no palpable masses, no distension Ext:    No edema; adequate peripheral perfusion Skin:      Warm and dry; no rash Neuro: alert and oriented x 3 Psych: normal mood and affect  Data Reviewed:  Reviewed labs, radiology imaging, old records and pertinent past GI work up   Assessment and Plan/Recommendations:  52 year old female with iron deficiency anemia secondary to chronic GI blood  loss in the setting of terminal ileal erosions Currently asymptomatic Continue oral iron supplements Monitor hemoglobin and ferritin every 6 to 12 months Due for screening colonoscopy March 2029 Return as needed  15 minutes was spent face-to-face with the patient. Greater than 50% of the time used for counseling as well as treatment plan and follow-up. She had multiple questions which were answered to her satisfaction  K. Denzil Magnuson , MD [phone]    CC: Lucretia Kern, DO

## 2017-11-08 ENCOUNTER — Other Ambulatory Visit: Payer: Self-pay | Admitting: Family Medicine

## 2017-11-08 MED FILL — PRAVASTATIN SODIUM 20 MG TA: 20 | 90 days supply | Qty: 90 | Fill #0

## 2017-11-08 MED FILL — FLUoxetine HCL 40 MG CAPS: 40 | 30 days supply | Qty: 30 | Fill #5

## 2017-12-07 ENCOUNTER — Other Ambulatory Visit: Payer: Self-pay | Admitting: Family Medicine

## 2017-12-07 MED FILL — FLUoxetine HCL 40 MG CAPS: 40 | 30 days supply | Qty: 30 | Fill #0

## 2017-12-07 MED FILL — AMLODIPINE BESYLATE 5 MG TA: 5 | 90 days supply | Qty: 90 | Fill #1

## 2017-12-14 NOTE — Progress Notes (Deleted)
HPI:  Using dictation device. Unfortunately this device frequently misinterprets words/phrases.  Rachael Hodge is a pleasant 52 y.o. here for follow up. Chronic medical problems summarized below were reviewed for changes and stability and were updated as needed below. These issues and their treatment remain stable for the most part. She had mildly low potassium on her last set of labs. ***. Denies CP, SOB, DOE, treatment intolerance or new symptoms. Due for labs, PHQ9  CPE 05/17/17 Depression: -on prozac 40mg  daily -reports: doing well on the medication -PHQ9 0  HTN: -meds: amlodipine  Borderline diabetes/HLD/Overweight: -meds: pravastatin added 05/2017 -wt 159 (8/19) she started [employer] diet plan -->   Leukopenia/iron def anemia: -s/p evaluation with hematology in 2018, felt lymphopenia related to allergies or virus per notes, no further eval advised -had eval with GI, endoscopy, capsule endoscopy - on entocort for several months for ulcer -seeing Dr. Irene Limbo about the iron def anemia   ROS: See pertinent positives and negatives per HPI.  Past Medical History:  Diagnosis Date  . Anemia   . Depression   . Eczema   . GERD (gastroesophageal reflux disease)   . Hyperlipidemia   . Hypertension   . Urticaria     Past Surgical History:  Procedure Laterality Date  . COLPOSCOPY  2006  . GIVENS CAPSULE STUDY N/A 05/09/2017   Procedure: GIVENS CAPSULE STUDY;  Surgeon: Mauri Pole, MD;  Location: [facility];  Service: Endoscopy;  Laterality: N/A;    Family History  Problem Relation Age of Onset  . Heart disease Father   . Hyperlipidemia Father   . Hypertension Father   . Stroke Father   . Heart disease Paternal Grandfather   . Crohn's disease Son   . Breast cancer Neg Hx   . Colon cancer Neg Hx     SOCIAL HX: ***   Current Outpatient Medications:  .  amLODipine (NORVASC) 5 MG tablet, TAKE 1 TABLET BY MOUTH DAILY., Disp: 90 tablet, Rfl: 1 .  Crisaborole  (EUCRISA) 2 % OINT, Apply 1 application topically 2 (two) times daily., Disp: 60 g, Rfl: 3 .  Fish Oil OIL, 1,200 mg daily. , Disp: , Rfl:  .  FLUoxetine (PROZAC) 40 MG capsule, TAKE 1 CAPSULE BY MOUTH DAILY., Disp: 30 capsule, Rfl: 5 .  hydrOXYzine (ATARAX/VISTARIL) 25 MG tablet, Take 1 tablet at bedtime as needed for itching, Disp: 30 tablet, Rfl: 3 .  Multiple Vitamin (MULTIVITAMIN) capsule, Take 1 capsule by mouth daily., Disp: , Rfl:  .  omeprazole (PRILOSEC) 40 MG capsule, Take 1 capsule (40 mg total) by mouth 2 (two) times daily before a meal., Disp: 180 capsule, Rfl: 3 .  pimecrolimus (ELIDEL) 1 % cream, Apply topically 2 (two) times daily., Disp: 30 g, Rfl: 3 .  pravastatin (PRAVACHOL) 20 MG tablet, TAKE 1 TABLET BY MOUTH DAILY., Disp: 90 tablet, Rfl: 1 .  Probiotic Product (PROBIOTIC-10 PO), Take by mouth., Disp: , Rfl:   EXAM:  There were no vitals filed for this visit.  There is no height or weight on file to calculate BMI.  GENERAL: vitals reviewed and listed above, alert, oriented, appears well hydrated and in no acute distress  HEENT: atraumatic, conjunttiva clear, no obvious abnormalities on inspection of external nose and ears  NECK: no obvious masses on inspection  LUNGS: clear to auscultation bilaterally, no wheezes, rales or rhonchi, good air movement  CV: HRRR, no peripheral edema  MS: moves all extremities without noticeable abnormality *** PSYCH: pleasant  and cooperative, no obvious depression or anxiety  ASSESSMENT AND PLAN:  Discussed the following assessment and plan:  No diagnosis found.  *** -Patient advised to return or notify a doctor immediately if symptoms worsen or persist or new concerns arise.  There are no Patient Instructions on file for this visit.  Lucretia Kern, DO

## 2017-12-15 ENCOUNTER — Ambulatory Visit: Payer: 59 | Admitting: Family Medicine

## 2017-12-18 NOTE — Progress Notes (Addendum)
0         HPI:  Using dictation device. Unfortunately this device frequently misinterprets words/phrases.  Rachael Hodge is a pleasant 52 y.o. here for follow up. Chronic medical problems summarized below were reviewed for changes and stability and were updated as needed below. These issues and their treatment remain stable for the most part. Reports doing well. Was very stress she would be late and BP elevated on arrival - much better on recheck sitting. Reports doing great. Mood has been great. has been on prozac for a long time. Once tried to stop suddenly remotely and did not do well. Interested in decreasing some and maybe eventually stopping.Denies CP, SOB, DOE, treatment intolerance or new symptoms. Due for phq9   CPE 05/17/17  Overweight: -was working on diet with [employer] in 2019 -wt 159 8/19 --> 158 11/19  Depression: -on prozac 40mg  daily -reports: doing well on the medication -PHQ9 0  HTN: -meds: amlodipine  Borderline diabetes/HLD: -meds: pravastatin added 05/2017  Leukopenia/iron def anemia: -s/p evaluation with hematology in 2018, felt lymphopenia related to allergies or virus per notes, no further eval advised -had eval with GI, endoscopy, capsule endoscopy - on entocort for several months for ulcer -seeing Dr. Irene Limbo about the iron def anemia  ROS: See pertinent positives and negatives per HPI.  Past Medical History:  Diagnosis Date  . Anemia   . Depression   . Eczema   . GERD (gastroesophageal reflux disease)   . Hyperlipidemia   . Hypertension   . Urticaria     Past Surgical History:  Procedure Laterality Date  . COLPOSCOPY  2006  . GIVENS CAPSULE STUDY N/A 05/09/2017   Procedure: GIVENS CAPSULE STUDY;  Surgeon: Mauri Pole, MD;  Location: [facility];  Service: Endoscopy;  Laterality: N/A;    Family History  Problem Relation Age of Onset  . Heart disease Father   . Hyperlipidemia Father   . Hypertension Father   . Stroke Father   .  Heart disease Paternal Grandfather   . Crohn's disease Son   . Breast cancer Neg Hx   . Colon cancer Neg Hx     SOCIAL HX: see hpi   Current Outpatient Medications:  .  amLODipine (NORVASC) 5 MG tablet, TAKE 1 TABLET BY MOUTH DAILY., Disp: 90 tablet, Rfl: 1 .  Crisaborole (EUCRISA) 2 % OINT, Apply 1 application topically 2 (two) times daily., Disp: 60 g, Rfl: 3 .  Fish Oil OIL, 1,200 mg daily. , Disp: , Rfl:  .  hydrOXYzine (ATARAX/VISTARIL) 25 MG tablet, Take 1 tablet at bedtime as needed for itching, Disp: 30 tablet, Rfl: 3 .  Multiple Vitamin (MULTIVITAMIN) capsule, Take 1 capsule by mouth daily., Disp: , Rfl:  .  omeprazole (PRILOSEC) 40 MG capsule, Take 1 capsule (40 mg total) by mouth 2 (two) times daily before a meal., Disp: 180 capsule, Rfl: 3 .  pimecrolimus (ELIDEL) 1 % cream, Apply topically 2 (two) times daily., Disp: 30 g, Rfl: 3 .  pravastatin (PRAVACHOL) 20 MG tablet, TAKE 1 TABLET BY MOUTH DAILY., Disp: 90 tablet, Rfl: 1 .  Probiotic Product (PROBIOTIC-10 PO), Take by mouth., Disp: , Rfl:  .  FLUoxetine (PROZAC) 20 MG tablet, Take 1 tablet (20 mg total) by mouth daily., Disp: 90 tablet, Rfl: 3  EXAM:  Vitals:   12/19/17 1138  BP: 118/70  Pulse: 65  Temp: 97.9 F (36.6 C)    Body mass index is 29.03 kg/m.  GENERAL: vitals  reviewed and listed above, alert, oriented, appears well hydrated and in no acute distress  HEENT: atraumatic, conjunttiva clear, no obvious abnormalities on inspection of external nose and ears  NECK: no obvious masses on inspection  LUNGS: clear to auscultation bilaterally, no wheezes, rales or rhonchi, good air movement  CV: HRRR, no peripheral edema  MS: moves all extremities without noticeable abnormality  PSYCH: pleasant and cooperative, no obvious depression or anxiety  ASSESSMENT AND PLAN:  Discussed the following assessment and plan:  Hyperlipidemia, unspecified hyperlipidemia type  Essential hypertension - Plan: Basic  metabolic panel  Hyperglycemia  Major depressive disorder with single episode, in full remission ([place])  BMI 29.0-29.9,adult  -opted to decrease prozac some - she agrees to call if any trouble with this. Will consider tapering off in the spring if doing well. -lifestyle recs -follow up 3-4 months -Patient advised to return or notify a doctor immediately if symptoms worsen or persist or new concerns arise.  Patient Instructions  BEFORE YOU LEAVE: -update phq9 in epic -lab -follow up: 3-4 months   Decrease prozac to 20mg  daily - I sent a new dose  Continue other medications, healthy diet and regular exercise   We recommend the following healthy lifestyle for LIFE: 1) Small portions. But, make sure to get regular (at least 3 per day), healthy meals and small healthy snacks if needed.  2) Eat a healthy clean diet.   TRY TO EAT: -at least 5-7 servings of low sugar, colorful, and nutrient rich vegetables per day (not corn, potatoes or bananas.) -berries are the best choice if you wish to eat fruit (only eat small amounts if trying to reduce weight)  -lean meets (fish, white meat of chicken or [place]) -vegan proteins for some meals - beans or tofu, whole grains, nuts and seeds -Replace bad fats with good fats - good fats include: fish, nuts and seeds, canola oil, olive oil -small amounts of low fat or non fat dairy -small amounts of100 % whole grains - check the lables -drink plenty of water  AVOID: -SUGAR, sweets, anything with added sugar, corn syrup or sweeteners - must read labels as even foods advertised as "healthy" often are loaded with sugar -if you must have a sweetener, small amounts of stevia may be best -sweetened beverages and artificially sweetened beverages -simple starches (rice, bread, potatoes, pasta, chips, etc - small amounts of 100% whole grains are ok) -red meat, pork, butter -fried foods, fast food, processed food, excessive dairy, eggs and  coconut.  3)Get at least 150 minutes of sweaty aerobic exercise per week.  4)Reduce stress - consider counseling, meditation and relaxation to balance other aspects of your life.    Lucretia Kern, DO

## 2017-12-19 ENCOUNTER — Encounter: Payer: Self-pay | Admitting: Family Medicine

## 2017-12-19 ENCOUNTER — Ambulatory Visit: Payer: 59 | Admitting: Family Medicine

## 2017-12-19 VITALS — BP 118/70 | HR 65 | Temp 97.9°F | Ht 62.0 in | Wt 158.7 lb

## 2017-12-19 DIAGNOSIS — E876 Hypokalemia: Secondary | ICD-10-CM

## 2017-12-19 DIAGNOSIS — E785 Hyperlipidemia, unspecified: Secondary | ICD-10-CM | POA: Diagnosis not present

## 2017-12-19 DIAGNOSIS — I10 Essential (primary) hypertension: Secondary | ICD-10-CM | POA: Diagnosis not present

## 2017-12-19 DIAGNOSIS — F325 Major depressive disorder, single episode, in full remission: Secondary | ICD-10-CM

## 2017-12-19 DIAGNOSIS — R739 Hyperglycemia, unspecified: Secondary | ICD-10-CM

## 2017-12-19 DIAGNOSIS — Z6829 Body mass index (BMI) 29.0-29.9, adult: Secondary | ICD-10-CM | POA: Diagnosis not present

## 2017-12-19 LAB — BASIC METABOLIC PANEL
BUN: 11 mg/dL (ref 6–23)
CHLORIDE: 100 meq/L (ref 96–112)
CO2: 28 meq/L (ref 19–32)
CREATININE: 0.81 mg/dL (ref 0.40–1.20)
Calcium: 9.8 mg/dL (ref 8.4–10.5)
GFR: 78.06 mL/min (ref >60.00)
Glucose, Bld: 91 mg/dL (ref 70–99)
Potassium: 4.7 mEq/L (ref 3.5–5.1)
Sodium: 139 mEq/L (ref 135–145)

## 2017-12-19 MED ORDER — FLUOXETINE HCL 20 MG PO TABS: 20.0000 mg | ORAL_TABLET | Freq: Every day | ORAL | 3 refills | Status: DC

## 2017-12-19 MED FILL — FLUoxetine HCL 20 MG CAPS: 20 | 90 days supply | Qty: 90 | Fill #0

## 2017-12-19 NOTE — Patient Instructions (Signed)
BEFORE YOU LEAVE: -update phq9 in epic -lab -follow up: 3-4 months   Decrease prozac to 20mg  daily - I sent a new dose  Continue other medications, healthy diet and regular exercise   We recommend the following healthy lifestyle for LIFE: 1) Small portions. But, make sure to get regular (at least 3 per day), healthy meals and small healthy snacks if needed.  2) Eat a healthy clean diet.   TRY TO EAT: -at least 5-7 servings of low sugar, colorful, and nutrient rich vegetables per day (not corn, potatoes or bananas.) -berries are the best choice if you wish to eat fruit (only eat small amounts if trying to reduce weight)  -lean meets (fish, white meat of chicken or [place]) -vegan proteins for some meals - beans or tofu, whole grains, nuts and seeds -Replace bad fats with good fats - good fats include: fish, nuts and seeds, canola oil, olive oil -small amounts of low fat or non fat dairy -small amounts of100 % whole grains - check the lables -drink plenty of water  AVOID: -SUGAR, sweets, anything with added sugar, corn syrup or sweeteners - must read labels as even foods advertised as "healthy" often are loaded with sugar -if you must have a sweetener, small amounts of stevia may be best -sweetened beverages and artificially sweetened beverages -simple starches (rice, bread, potatoes, pasta, chips, etc - small amounts of 100% whole grains are ok) -red meat, pork, butter -fried foods, fast food, processed food, excessive dairy, eggs and coconut.  3)Get at least 150 minutes of sweaty aerobic exercise per week.  4)Reduce stress - consider counseling, meditation and relaxation to balance other aspects of your life.

## 2018-01-02 ENCOUNTER — Encounter: Payer: Self-pay | Admitting: Family Medicine

## 2018-01-02 MED ORDER — FLUOXETINE HCL 40 MG PO CAPS: 40.0000 mg | ORAL_CAPSULE | Freq: Every day | ORAL | 3 refills | Status: DC

## 2018-01-03 MED FILL — FLUoxetine HCL 40 MG CAPS: 40 | 90 days supply | Qty: 90 | Fill #0

## 2018-02-13 ENCOUNTER — Other Ambulatory Visit: Payer: Self-pay | Admitting: Family Medicine

## 2018-02-13 MED FILL — PRAVASTATIN SODIUM 20 MG TA: 20 | 90 days supply | Qty: 90 | Fill #1

## 2018-02-17 ENCOUNTER — Other Ambulatory Visit: Payer: Self-pay | Admitting: Gastroenterology

## 2018-02-17 MED FILL — AMLODIPINE BESYLATE 5 MG TA: 5 | 90 days supply | Qty: 90 | Fill #0

## 2018-02-17 MED FILL — OMEPRAZOLE 40 MG CPDR: 40 | 90 days supply | Qty: 180 | Fill #0

## 2018-03-21 NOTE — Progress Notes (Signed)
HPI:  Using dictation device. Unfortunately this device frequently misinterprets words/phrases.  Rachael Hodge is a pleasant 53 y.o. here for follow up. Chronic medical problems summarized below were reviewed for changes and stability and were updated as needed below. These issues and their treatment remain stable for the most part. Frustrated has gained weight. Reports eats healthy and get regular exercise. Also feels hair is thinning. Wants to check thyroid. No reported CP, SOB, DOE, treatment intolerance or new symptoms.  CPE 05/17/17  Overweight: -was working on diet with [employer] in 2019 -wt 159 8/19 --> 158 11/19 --> 162  Depression: -on prozac 40mg  daily -reports: doing well on the medication -PHQ9 0  HTN: -meds: amlodipine  Borderline diabetes/HLD: -meds: pravastatin added 05/2017  Leukopenia/iron def anemia: -s/p evaluation with hematology in 2018, felt lymphopenia related to allergies or virus per notes, no further eval advised -had eval with GI, endoscopy, capsule endoscopy- on entocort for several months for ulcer -seeing Dr. Irene Limbo about the iron def anemia  ROS: See pertinent positives and negatives per HPI.  Past Medical History:  Diagnosis Date  . Anemia   . Depression   . Eczema   . GERD (gastroesophageal reflux disease)   . Hyperlipidemia   . Hypertension   . Urticaria     Past Surgical History:  Procedure Laterality Date  . COLPOSCOPY  2006  . GIVENS CAPSULE STUDY N/A 05/09/2017   Procedure: GIVENS CAPSULE STUDY;  Surgeon: Mauri Pole, MD;  Location: [facility];  Service: Endoscopy;  Laterality: N/A;    Family History  Problem Relation Age of Onset  . Heart disease Father   . Hyperlipidemia Father   . Hypertension Father   . Stroke Father   . Heart disease Paternal Grandfather   . Crohn's disease Son   . Breast cancer Neg Hx   . Colon cancer Neg Hx     SOCIAL HX: see hpi   Current Outpatient Medications:  .  amLODipine  (NORVASC) 5 MG tablet, TAKE 1 TABLET BY MOUTH DAILY., Disp: 90 tablet, Rfl: 1 .  Crisaborole (EUCRISA) 2 % OINT, Apply 1 application topically 2 (two) times daily., Disp: 60 g, Rfl: 3 .  Fish Oil OIL, 1,200 mg daily. , Disp: , Rfl:  .  FLUoxetine (PROZAC) 40 MG capsule, Take 1 capsule (40 mg total) by mouth daily., Disp: 90 capsule, Rfl: 3 .  hydrOXYzine (ATARAX/VISTARIL) 25 MG tablet, Take 1 tablet at bedtime as needed for itching, Disp: 30 tablet, Rfl: 3 .  Multiple Vitamin (MULTIVITAMIN) capsule, Take 1 capsule by mouth daily., Disp: , Rfl:  .  omeprazole (PRILOSEC) 40 MG capsule, TAKE 1 CAPSULE BY MOUTH TWICE DAILY BEFORE A MEAL, Disp: 180 capsule, Rfl: 3 .  pimecrolimus (ELIDEL) 1 % cream, Apply topically 2 (two) times daily., Disp: 30 g, Rfl: 3 .  pravastatin (PRAVACHOL) 20 MG tablet, TAKE 1 TABLET BY MOUTH DAILY., Disp: 90 tablet, Rfl: 1 .  Probiotic Product (PROBIOTIC-10 PO), Take by mouth., Disp: , Rfl:   EXAM:  Vitals:   03/23/18 0704  BP: 118/70  Pulse: 81  Temp: 98.4 F (36.9 C)    Body mass index is 29.7 kg/m.  GENERAL: vitals reviewed and listed above, alert, oriented, appears well hydrated and in no acute distress  HEENT: atraumatic, conjunttiva clear, no obvious abnormalities on inspection of external nose and ears, hair thick no obvious balding.  NECK: no obvious masses on inspection  LUNGS: clear to auscultation bilaterally, no wheezes, rales  or rhonchi, good air movement  CV: HRRR, no peripheral edema  MS: moves all extremities without noticeable abnormality  PSYCH: pleasant and cooperative, no obvious depression or anxiety  ASSESSMENT AND PLAN:  Discussed the following assessment and plan:  Essential hypertension - Plan: Basic metabolic panel  Hyperlipidemia, unspecified hyperlipidemia type  Hyperglycemia - Plan: Hemoglobin A1c  Major depressive disorder with single episode, in full remission ([name])  Overweight (BMI 25.0-29.9) - Plan: TSH  -labs  per orders -lengthy discussion various diets and she may try keto -tsh check and may refer to [facility] if hair loss continues, she will try hair count -lifestyle recs -CPE 3-4 months  Patient Instructions  BEFORE YOU LEAVE: -labs -phq9 in epic -follow up: CPE fasting in April or may  We have ordered labs or studies at this visit. It can take up to 1-2 weeks for results and processing. IF results require follow up or explanation, we will call you with instructions. Clinically stable results will be released to your [name]. If you have not heard from [place] or cannot find your results in [name] in 2 weeks please contact our office at [phone].  If you are not yet signed up for [name], please consider signing up.   We recommend the following healthy lifestyle for LIFE: 1) Small portions. But, make sure to get regular (at least 3 per day), healthy meals and small healthy snacks if needed.  2) Eat a healthy clean diet.   TRY TO EAT: -at least 5-7 servings of low sugar, colorful, and nutrient rich vegetables per day (not corn, potatoes or bananas.) -berries are the best choice if you wish to eat fruit (only eat small amounts if trying to reduce weight)  -lean meets (fish, white meat of chicken or [place]) -vegan proteins for some meals - beans or tofu, whole grains, nuts and seeds -Replace bad fats with good fats - good fats include: fish, nuts and seeds, canola oil, olive oil -small amounts of low fat or non fat dairy -small amounts of100 % whole grains - check the lables -drink plenty of water  AVOID: -SUGAR, sweets, anything with added sugar, corn syrup or sweeteners - must read labels as even foods advertised as "healthy" often are loaded with sugar -if you must have a sweetener, small amounts of stevia may be best -sweetened beverages and artificially sweetened beverages -simple starches (rice, bread, potatoes, pasta, chips, etc - small amounts of 100% whole grains are ok) -red  meat, pork, butter -fried foods, fast food, processed food, excessive dairy, eggs and coconut.  3)Get at least 150 minutes of sweaty aerobic exercise per week.  4)Reduce stress - consider counseling, meditation and relaxation to balance other aspects of your life.           Lucretia Kern, DO

## 2018-03-23 ENCOUNTER — Encounter: Payer: Self-pay | Admitting: Family Medicine

## 2018-03-23 ENCOUNTER — Ambulatory Visit: Payer: 59 | Admitting: Family Medicine

## 2018-03-23 VITALS — BP 118/70 | HR 81 | Temp 98.4°F | Ht 62.0 in | Wt 162.4 lb

## 2018-03-23 DIAGNOSIS — R739 Hyperglycemia, unspecified: Secondary | ICD-10-CM | POA: Diagnosis not present

## 2018-03-23 DIAGNOSIS — I10 Essential (primary) hypertension: Secondary | ICD-10-CM | POA: Diagnosis not present

## 2018-03-23 DIAGNOSIS — E785 Hyperlipidemia, unspecified: Secondary | ICD-10-CM

## 2018-03-23 DIAGNOSIS — F325 Major depressive disorder, single episode, in full remission: Secondary | ICD-10-CM | POA: Diagnosis not present

## 2018-03-23 DIAGNOSIS — E663 Overweight: Secondary | ICD-10-CM | POA: Diagnosis not present

## 2018-03-23 LAB — BASIC METABOLIC PANEL
BUN: 19 mg/dL (ref 6–23)
CHLORIDE: 102 meq/L (ref 96–112)
CO2: 30 meq/L (ref 19–32)
Calcium: 9 mg/dL (ref 8.4–10.5)
Creatinine, Ser: 0.86 mg/dL (ref 0.40–1.20)
GFR: 68.47 mL/min (ref >60.00)
Glucose, Bld: 92 mg/dL (ref 70–99)
Potassium: 4.4 mEq/L (ref 3.5–5.1)
Sodium: 140 mEq/L (ref 135–145)

## 2018-03-23 LAB — HEMOGLOBIN A1C: HEMOGLOBIN A1C: 6.3 % (ref 4.6–6.5)

## 2018-03-23 LAB — TSH: TSH: 2.98 u[IU]/mL (ref 0.35–4.50)

## 2018-03-23 NOTE — Patient Instructions (Signed)
BEFORE YOU LEAVE: -labs -phq9 in epic -follow up: CPE fasting in April or may  We have ordered labs or studies at this visit. It can take up to 1-2 weeks for results and processing. IF results require follow up or explanation, we will call you with instructions. Clinically stable results will be released to your [facility]. If you have not heard from [place] or cannot find your results in [facility] in 2 weeks please contact our office at [phone].  If you are not yet signed up for [facility], please consider signing up.   We recommend the following healthy lifestyle for LIFE: 1) Small portions. But, make sure to get regular (at least 3 per day), healthy meals and small healthy snacks if needed.  2) Eat a healthy clean diet.   TRY TO EAT: -at least 5-7 servings of low sugar, colorful, and nutrient rich vegetables per day (not corn, potatoes or bananas.) -berries are the best choice if you wish to eat fruit (only eat small amounts if trying to reduce weight)  -lean meets (fish, white meat of chicken or [place]) -vegan proteins for some meals - beans or tofu, whole grains, nuts and seeds -Replace bad fats with good fats - good fats include: fish, nuts and seeds, canola oil, olive oil -small amounts of low fat or non fat dairy -small amounts of100 % whole grains - check the lables -drink plenty of water  AVOID: -SUGAR, sweets, anything with added sugar, corn syrup or sweeteners - must read labels as even foods advertised as "healthy" often are loaded with sugar -if you must have a sweetener, small amounts of stevia may be best -sweetened beverages and artificially sweetened beverages -simple starches (rice, bread, potatoes, pasta, chips, etc - small amounts of 100% whole grains are ok) -red meat, pork, butter -fried foods, fast food, processed food, excessive dairy, eggs and coconut.  3)Get at least 150 minutes of sweaty aerobic exercise per week.  4)Reduce stress - consider counseling,  meditation and relaxation to balance other aspects of your life.

## 2018-04-03 ENCOUNTER — Ambulatory Visit: Payer: 59 | Admitting: Gastroenterology

## 2018-04-18 ENCOUNTER — Other Ambulatory Visit: Payer: Self-pay | Admitting: Family Medicine

## 2018-04-18 MED FILL — FLUoxetine HCL 40 MG CAPS: 40 | 90 days supply | Qty: 90 | Fill #1

## 2018-04-18 NOTE — Progress Notes (Signed)
53 y.o. [id number] Married White or Caucasian Not Hispanic or Latino female here for annual exam.   She had a small intestine bleed last year. Diagnosed last year. She has had anemia, allergy to iron transfusion. Not currently anemic.  Husband was diagnosed with afib last year with a weak heart. Last May 11, 2022 her Dad died, still dealing with his estate.  Both sons have Crohn's disease. Oldest was diagnosed at 74, other sone diagnosed last year. Boys live with their Dad in [place].     Patient's last menstrual period was 12/02/2012.          Sexually active: Yes.    The current method of family planning is post menopausal status.    Exercising: Yes.    walking, elliptical Smoker:  no  Health Maintenance: Pap:  03/25/2017 LSIL NEG HPV, 02-12-16 WNL + HR HPV negative 16/18/45,  08-24-12 WNL NEG HR HPV  History of abnormal Pap:  04/12/2017 low grade dysplasia on colpo, prior h/o a LEEP ~2010. MMG:  08/29/2017 Birads 1 negative Colonoscopy:  04/29/2017 polyps, f/u in 5-10 years.  TDaP:  05-11-13 Gardasil: N/A   reports that she has never smoked. She has never used smokeless tobacco. She reports current alcohol use of about 2.0 standard drinks of alcohol per week. She reports that she does not use drugs. 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 Kids are 107 and 21  Past Medical History:  Diagnosis Date  . Anemia   . Depression   . Eczema   . GERD (gastroesophageal reflux disease)   . Hyperlipidemia   . Hypertension   . Urticaria     Past Surgical History:  Procedure Laterality Date  . COLPOSCOPY  May 10, 2004  . GIVENS CAPSULE STUDY N/A 05/09/2017   Procedure: GIVENS CAPSULE STUDY;  Surgeon: Mauri Pole, MD;  Location: [facility];  Service: Endoscopy;  Laterality: N/A;    Current Outpatient Medications  Medication Sig Dispense Refill  . amLODipine (NORVASC) 5 MG tablet TAKE 1 TABLET BY MOUTH DAILY. 90 tablet 1  . Crisaborole (EUCRISA) 2 % OINT Apply 1 application topically 2 (two) times daily. 60 g 3  .  FLUoxetine (PROZAC) 40 MG capsule Take 1 capsule (40 mg total) by mouth daily. 90 capsule 3  . hydrOXYzine (ATARAX/VISTARIL) 25 MG tablet Take 1 tablet at bedtime as needed for itching 30 tablet 3  . omeprazole (PRILOSEC) 40 MG capsule TAKE 1 CAPSULE BY MOUTH TWICE DAILY BEFORE A MEAL 180 capsule 3  . pimecrolimus (ELIDEL) 1 % cream Apply topically 2 (two) times daily. 30 g 3  . pravastatin (PRAVACHOL) 20 MG tablet TAKE 1 TABLET BY MOUTH DAILY. 90 tablet 1  . Probiotic Product (PROBIOTIC-10 PO) Take by mouth.     No current facility-administered medications for this visit.     Family History  Problem Relation Age of Onset  . Heart disease Father   . Hyperlipidemia Father   . Hypertension Father   . Stroke Father   . Heart disease Paternal Grandfather   . Crohn's disease Son   . Breast cancer Neg Hx   . Colon cancer Neg Hx     Review of Systems  Constitutional:       Weight gain  HENT: Negative.   Eyes: Negative.   Respiratory: Negative.   Cardiovascular: Negative.   Gastrointestinal: Negative.   Endocrine: Negative.   Genitourinary: Negative.   Musculoskeletal: Negative.   Skin: Negative.   Allergic/Immunologic: Negative.   Neurological: Negative.   Hematological: Negative.  Psychiatric/Behavioral: Negative.     Exam:   BP 140/80 (BP Location: Right Arm, Patient Position: Sitting, Cuff Size: Normal)   Pulse 64   Ht 5' 2.21" (1.58 m)   Wt 159 lb 12.8 oz (72.5 kg)   LMP 12/02/2012   BMI 29.04 kg/m   Weight change: @WEIGHTCHANGE @ Height:   Height: 5' 2.21" (158 cm)  Ht Readings from Last 3 Encounters:  04/19/18 5' 2.21" (1.58 m)  03/23/18 5\' 2"  (1.575 m)  12/19/17 5\' 2"  (1.575 m)    General appearance: alert, cooperative and appears stated age Head: Normocephalic, without obvious abnormality, atraumatic Neck: no adenopathy, supple, symmetrical, trachea midline and thyroid normal to inspection and palpation Lungs: clear to auscultation  bilaterally Cardiovascular: regular rate and rhythm Breasts: normal appearance, no masses or tenderness Abdomen: soft, non-tender; non distended,  no masses,  no organomegaly Extremities: extremities normal, atraumatic, no cyanosis or edema Skin: Skin color, texture, turgor normal. No rashes or lesions Lymph nodes: Cervical, supraclavicular, and axillary nodes normal. No abnormal inguinal nodes palpated Neurologic: Grossly normal   Pelvic: External genitalia:  no lesions              Urethra:  normal appearing urethra with no masses, tenderness or lesions              Bartholins and Skenes: normal                 Vagina: normal appearing vagina with normal color and discharge, no lesions              Cervix: no lesions               Bimanual Exam:  Uterus:  normal size, contour, position, consistency, mobility, non-tender              Adnexa: no mass, fullness, tenderness               Rectovaginal: Confirms               Anus:  normal sphincter tone, no lesions  Chaperone was present for exam.  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

## 2018-04-19 ENCOUNTER — Ambulatory Visit (INDEPENDENT_AMBULATORY_CARE_PROVIDER_SITE_OTHER): Payer: 59 | Admitting: Obstetrics and Gynecology

## 2018-04-19 ENCOUNTER — Other Ambulatory Visit: Payer: Self-pay

## 2018-04-19 ENCOUNTER — Encounter: Payer: Self-pay | Admitting: Obstetrics and Gynecology

## 2018-04-19 ENCOUNTER — Other Ambulatory Visit (HOSPITAL_COMMUNITY)
Admission: RE | Admit: 2018-04-19 | Discharge: 2018-04-19 | Disposition: A | Discharge status: Home / Self Care | Payer: 59 | Source: Ambulatory Visit | Attending: Obstetrics and Gynecology | Admitting: Obstetrics and Gynecology

## 2018-04-19 VITALS — BP 140/80 | HR 64 | Ht 62.21 in | Wt 159.8 lb

## 2018-04-19 DIAGNOSIS — Z87410 Personal history of cervical dysplasia: Secondary | ICD-10-CM

## 2018-04-19 DIAGNOSIS — Z01419 Encounter for gynecological examination (general) (routine) without abnormal findings: Secondary | ICD-10-CM

## 2018-04-19 DIAGNOSIS — Z124 Encounter for screening for malignant neoplasm of cervix: Secondary | ICD-10-CM | POA: Insufficient documentation

## 2018-04-19 MED FILL — PRAVASTATIN SODIUM 20 MG TA: 20 | 90 days supply | Qty: 90 | Fill #0

## 2018-04-19 NOTE — Patient Instructions (Signed)

## 2018-04-24 LAB — CYTOLOGY - PAP: HPV: DETECTED — AB

## 2018-04-26 ENCOUNTER — Telehealth: Payer: Self-pay

## 2018-04-26 DIAGNOSIS — R87810 Cervical high risk human papillomavirus (HPV) DNA test positive: Secondary | ICD-10-CM

## 2018-04-26 DIAGNOSIS — R8789 Other abnormal findings in specimens from female genital organs: Secondary | ICD-10-CM

## 2018-04-26 DIAGNOSIS — R87618 Other abnormal cytological findings on specimens from cervix uteri: Secondary | ICD-10-CM

## 2018-04-26 DIAGNOSIS — R87612 Low grade squamous intraepithelial lesion on cytologic smear of cervix (LGSIL): Secondary | ICD-10-CM

## 2018-04-26 NOTE — Telephone Encounter (Signed)
-----   Message from [name], MD sent at 04/25/2018 12:54 PM EDT ----- Please inform the patient that her pap returned with LSIL, +HPV. She will need another colposcopy. Please set this up with me.

## 2018-04-26 NOTE — Telephone Encounter (Signed)
Spoke with patient. Advised of results as seen below from [place]. Patient verbalizes understanding.  Order placed. Appointment scheduled for 05/31/2018 at 9:30 am with Dr.Jertson. Instructions given. Motrin 800 mg po x , one hour before appointment with food. Make sure to eat a meal before appointment and drink plenty of fluids. Patient agreeable and verbalized understanding of all instructions. Encounter closed.

## 2018-04-27 ENCOUNTER — Telehealth: Payer: Self-pay

## 2018-04-27 NOTE — Telephone Encounter (Signed)
Spoke with patient. Patient would like to move her colposcopy appointment to 05/03/2018 in the afternoon. Appointment moved to 05/03/2018 at 1:30 pm with Dr.Jertson. Patient is agreeable to date and time. Encounter closed.

## 2018-05-03 ENCOUNTER — Encounter: Payer: Self-pay | Admitting: Obstetrics and Gynecology

## 2018-05-03 ENCOUNTER — Other Ambulatory Visit: Payer: Self-pay

## 2018-05-03 ENCOUNTER — Ambulatory Visit (INDEPENDENT_AMBULATORY_CARE_PROVIDER_SITE_OTHER): Payer: 59 | Admitting: Obstetrics and Gynecology

## 2018-05-03 VITALS — BP 120/70 | HR 76 | Temp 98.3°F | Resp 16 | Wt 159.0 lb

## 2018-05-03 DIAGNOSIS — N952 Postmenopausal atrophic vaginitis: Secondary | ICD-10-CM | POA: Diagnosis not present

## 2018-05-03 DIAGNOSIS — B977 Papillomavirus as the cause of diseases classified elsewhere: Secondary | ICD-10-CM | POA: Diagnosis not present

## 2018-05-03 DIAGNOSIS — R87612 Low grade squamous intraepithelial lesion on cytologic smear of cervix (LGSIL): Secondary | ICD-10-CM | POA: Diagnosis not present

## 2018-05-03 DIAGNOSIS — N870 Mild cervical dysplasia: Secondary | ICD-10-CM | POA: Diagnosis not present

## 2018-05-03 NOTE — Progress Notes (Signed)
GYNECOLOGY  VISIT   HPI: 53 y.o.   Married White or Caucasian Not Hispanic or Latino  female   [phone] with Patient's last menstrual period was 12/02/2012.   here for colposcopy  04-19-2018 LGSIL HPV HR+ 03-25-2017 LGSIL HPV HR+,  colposcopy in 3/19 was unsatisfactory, ECC with CIN I.  Prior pap in 1/18 was WNL with +HPV (negative for 16/18/45) Distant hx of LEEP.  Patient took 2 aleve at 12:30pm  GYNECOLOGIC HISTORY: Patient's last menstrual period was 12/02/2012. Contraception:postmenopausal Menopausal hormone therapy: none        OB History    Gravida  2   Para  2   Term  2   Preterm      AB      Living  2     SAB      TAB      Ectopic      Multiple      Live Births  2              Patient Active Problem List   Diagnosis Date Noted  . Heme positive stool   . Iron deficiency anemia 10/08/2016  . Hyperlipemia 09/29/2015  . Essential hypertension 09/29/2015  . Hyperglycemia 09/29/2015  . Major depressive disorder with single episode, in full remission ([place]) 09/29/2015    Past Medical History:  Diagnosis Date  . Anemia   . Depression   . Eczema   . GERD (gastroesophageal reflux disease)   . Hyperlipidemia   . Hypertension   . Urticaria     Past Surgical History:  Procedure Laterality Date  . COLPOSCOPY  2006  . GIVENS CAPSULE STUDY N/A 05/09/2017   Procedure: GIVENS CAPSULE STUDY;  Surgeon: Mauri Pole, MD;  Location: [facility];  Service: Endoscopy;  Laterality: N/A;    Current Outpatient Medications  Medication Sig Dispense Refill  . amLODipine (NORVASC) 5 MG tablet TAKE 1 TABLET BY MOUTH DAILY. 90 tablet 1  . Crisaborole (EUCRISA) 2 % OINT Apply 1 application topically 2 (two) times daily. 60 g 3  . FLUoxetine (PROZAC) 40 MG capsule Take 1 capsule (40 mg total) by mouth daily. 90 capsule 3  . hydrOXYzine (ATARAX/VISTARIL) 25 MG tablet Take 1 tablet at bedtime as needed for itching 30 tablet 3  . omeprazole (PRILOSEC) 40 MG  capsule TAKE 1 CAPSULE BY MOUTH TWICE DAILY BEFORE A MEAL 180 capsule 3  . pimecrolimus (ELIDEL) 1 % cream Apply topically 2 (two) times daily. 30 g 3  . pravastatin (PRAVACHOL) 20 MG tablet TAKE 1 TABLET BY MOUTH DAILY. 90 tablet 1  . Probiotic Product (PROBIOTIC-10 PO) Take by mouth.     No current facility-administered medications for this visit.      ALLERGIES: Penicillins; Sulfa antibiotics; and Feraheme [ferumoxytol]  Family History  Problem Relation Age of Onset  . Heart disease Father   . Hyperlipidemia Father   . Hypertension Father   . Stroke Father   . Heart disease Paternal Grandfather   . Crohn's disease Son   . Breast cancer Neg Hx   . Colon cancer Neg Hx     Social History   Socioeconomic History  . Marital status: Married    Spouse name: Not on file  . Number of children: Not on file  . Years of education: Not on file  . Highest education level: Not on file  Occupational History  . Not on file  Social Needs  . Financial resource strain: Not on file  .  Food insecurity:    Worry: Not on file    Inability: Not on file  . Transportation needs:    Medical: Not on file    Non-medical: Not on file  Tobacco Use  . Smoking status: Never Smoker  . Smokeless tobacco: Never Used  Substance and Sexual Activity  . Alcohol use: Yes    Alcohol/week: 2.0 standard drinks    Types: 2 Cans of beer per week  . Drug use: No  . Sexual activity: Yes    Partners: Male    Birth control/protection: Post-menopausal  Lifestyle  . Physical activity:    Days per week: Not on file    Minutes per session: Not on file  . Stress: Not on file  Relationships  . Social connections:    Talks on phone: Not on file    Gets together: Not on file    Attends religious service: Not on file    Active member of club or organization: Not on file    Attends meetings of clubs or organizations: Not on file    Relationship status: Not on file  . Intimate partner violence:    Fear of  current or ex partner: Not on file    Emotionally abused: Not on file    Physically abused: Not on file    Forced sexual activity: Not on file  Other Topics Concern  . Not on file  Social History Narrative   Work or School: Publishing copy Situation: lives with boyfriend      Spiritual Beliefs: Christian      Lifestyle: exercising, trying to eat well             Review of Systems  Constitutional: Negative.   HENT: Negative.   Eyes: Negative.   Respiratory: Negative.   Cardiovascular: Negative.   Gastrointestinal: Negative.   Genitourinary: Negative.   Musculoskeletal: Negative.   Skin: Negative.   Neurological: Negative.   Endo/Heme/Allergies: Negative.   Psychiatric/Behavioral: Negative.     PHYSICAL EXAMINATION:    LMP 12/02/2012     General appearance: alert, cooperative and appears stated age  Pelvic: External genitalia:  no lesions              Urethra:  normal appearing urethra with no masses, tenderness or lesions              Bartholins and Skenes: normal                 Vagina: normal appearing vagina with normal color and discharge, no lesions              Cervix: no lesions  Colposcopy: unsatisfactory, negative aceto-white changes. ECC done. There is a 2-3 cm area on the left vaginal side wall with decreased lugols uptake. Vaginal biopsy taken at 3 o'clock. Minimal bleeding, treated with silver nitrate.   Chaperone was present for exam.  ASSESSMENT LSIL pap with hpv, unsatisfactory colposcopy    PLAN Vaginal biopsy and ECC done Further plans depending on results She is aware she will either need treatment or further f/u. Counseled as to possible outcomes.    An After Visit Summary was printed and given to the patient.

## 2018-05-03 NOTE — Patient Instructions (Signed)

## 2018-05-09 MED FILL — AMLODIPINE BESYLATE 5 MG TA: 5 | 90 days supply | Qty: 90 | Fill #0

## 2018-05-25 ENCOUNTER — Encounter: Payer: 59 | Admitting: Family Medicine

## 2018-05-31 ENCOUNTER — Ambulatory Visit: Payer: 59 | Admitting: Obstetrics and Gynecology

## 2018-06-19 ENCOUNTER — Encounter: Payer: 59 | Admitting: Family Medicine

## 2018-07-03 ENCOUNTER — Ambulatory Visit (INDEPENDENT_AMBULATORY_CARE_PROVIDER_SITE_OTHER): Payer: 59 | Admitting: Family Medicine

## 2018-07-03 ENCOUNTER — Other Ambulatory Visit: Payer: Self-pay

## 2018-07-03 ENCOUNTER — Encounter: Payer: Self-pay | Admitting: Family Medicine

## 2018-07-03 DIAGNOSIS — R739 Hyperglycemia, unspecified: Secondary | ICD-10-CM

## 2018-07-03 DIAGNOSIS — E538 Deficiency of other specified B group vitamins: Secondary | ICD-10-CM

## 2018-07-03 DIAGNOSIS — F325 Major depressive disorder, single episode, in full remission: Secondary | ICD-10-CM | POA: Diagnosis not present

## 2018-07-03 DIAGNOSIS — E785 Hyperlipidemia, unspecified: Secondary | ICD-10-CM

## 2018-07-03 DIAGNOSIS — K2970 Gastritis, unspecified, without bleeding: Secondary | ICD-10-CM | POA: Diagnosis not present

## 2018-07-03 DIAGNOSIS — I10 Essential (primary) hypertension: Secondary | ICD-10-CM | POA: Diagnosis not present

## 2018-07-03 DIAGNOSIS — D500 Iron deficiency anemia secondary to blood loss (chronic): Secondary | ICD-10-CM

## 2018-07-03 MED ORDER — CYANOCOBALAMIN 100 MCG PO TABS: 100.0000 ug | ORAL_TABLET | Freq: Every day | ORAL | Status: DC

## 2018-07-03 MED ORDER — FERROUS SULFATE 325 (65 FE) MG PO TABS: 325.0000 mg | ORAL_TABLET | Freq: Every day | ORAL | 3 refills | Status: DC

## 2018-07-03 NOTE — Progress Notes (Signed)
Virtual Visit via Video Note  I connected with Rachael Hodge   on 07/03/18 at  8:30 AM EDT by a video enabled telemedicine application and verified that I am speaking with the correct person using two identifiers.  Location patient: home Location provider:work office Persons participating in the virtual visit: patient, provider  I discussed the limitations of evaluation and management by telemedicine and the availability of in person appointments. The patient expressed understanding and agreed to proceed.   Rachael Hodge DOB: July 04, 1965 Encounter date: 07/03/2018  This is a 53 y.o. female who presents to establish care. Chief Complaint  Patient presents with  . Establish Care    transfer from Dr Maudie Mercury    History of present illness:  No specific issues today; just establishing care. Due for physical.   She is executive assistance in privacy/audits.   HTN: on amlodipine 5mg . Hasn't been checking bp at home. Feels well though and states that usually if high she gets some ringing in ears. Does very well on amlodipine without side effects.   Hyperlipidemia: on pravastatin 20mg  (last checked 09/2017). Tolerating this medication ok.   Hyperglycemia:A1C in 03/2018 was 6.3. Has been doing better with eating and exercise during COVID. Has more time now. Has been walking at least 2 miles and/or eliptycal.   Depression:prozac 40mg  daily. First couple of weeks with COVID were awkward, but she is starting to get used to it.   Iron def anemia: last ferritin was 08/2017 18.4; she is taking iron daily. Is following with specialist. There is still some concern on her part that she has Crohn's because she is spending more time in the morning on the toilet.   GERD/hx of ulcer: prilosec 40mg  daily; has acid reflux if she doesn't take this. Has been on this for 1.5 years.   In med hx urticaria, eczema listed: stated that she saw specialists over a year. Once intestinal issues were corrected and anemia  was corrected then rash went away.   Past Medical History:  Diagnosis Date  . Abnormal Pap smear of cervix    020 LGSIL HPV HR+, hx of LEEP  . Anemia   . Depression   . Eczema   . GERD (gastroesophageal reflux disease)   . Hyperlipidemia   . Hypertension   . Urticaria    Past Surgical History:  Procedure Laterality Date  . COLPOSCOPY  2006  . GIVENS CAPSULE STUDY N/A 05/09/2017   Procedure: GIVENS CAPSULE STUDY;  Surgeon: Mauri Pole, MD;  Location: [facility];  Service: Endoscopy;  Laterality: N/A;   Allergies  Allergen Reactions  . Penicillins Shortness Of Breath  . Sulfa Antibiotics Shortness Of Breath  . Feraheme [Ferumoxytol] Nausea Only and Other (See Comments)    Hypotension and low heart rate   Current Meds  Medication Sig  . amLODipine (NORVASC) 5 MG tablet TAKE 1 TABLET BY MOUTH DAILY.  Stasia Cavalier (EUCRISA) 2 % OINT Apply 1 application topically 2 (two) times daily.  [name] FLUoxetine (PROZAC) 40 MG capsule Take 1 capsule (40 mg total) by mouth daily.  . hydrOXYzine (ATARAX/VISTARIL) 25 MG tablet Take 1 tablet at bedtime as needed for itching  . omeprazole (PRILOSEC) 40 MG capsule TAKE 1 CAPSULE BY MOUTH TWICE DAILY BEFORE A MEAL  . pimecrolimus (ELIDEL) 1 % cream Apply topically 2 (two) times daily.  . pravastatin (PRAVACHOL) 20 MG tablet TAKE 1 TABLET BY MOUTH DAILY.  . Probiotic Product (PROBIOTIC-10 PO) Take by mouth.   Social  History   Tobacco Use  . Smoking status: Never Smoker  . Smokeless tobacco: Never Used  Substance Use Topics  . Alcohol use: Yes    Alcohol/week: 2.0 standard drinks    Types: 2 Cans of beer per week   Family History  Problem Relation Age of Onset  . Early death Mother 45       died in sleep; uncertain cause  . Hyperlipidemia Father   . Hypertension Father   . Stroke Father 36  . CAD Father 54       CAD, quadruple bypass  . Heart disease Paternal Grandfather 64  . Crohn's disease Son   . Heart attack Maternal  Grandmother   . Alzheimer's disease Maternal Grandmother   . Heart attack Maternal Grandfather   . Heart attack Paternal Grandmother   . Diabetes Paternal Grandmother   . Breast cancer Neg Hx   . Colon cancer Neg Hx      Review of Systems  Constitutional: Negative for chills, fatigue and fever.  Respiratory: Negative for cough, chest tightness, shortness of breath and wheezing.   Cardiovascular: Negative for chest pain, palpitations and leg swelling.  Gastrointestinal: Positive for abdominal pain (gets ache after eating; burning/cramping) and nausea (with abd pain episodes; sees GI next week). Negative for blood in stool and constipation. Diarrhea: loose stools.  Skin: Negative for rash.  Psychiatric/Behavioral: Negative for agitation and sleep disturbance. The patient is not nervous/anxious.     Objective:  LMP 12/02/2012       BP Readings from Last 3 Encounters:  05/03/18 120/70  04/19/18 140/80  03/23/18 118/70   Wt Readings from Last 3 Encounters:  05/03/18 159 lb (72.1 kg)  04/19/18 159 lb 12.8 oz (72.5 kg)  03/23/18 162 lb 6.4 oz (73.7 kg)    EXAM:  GENERAL: alert, oriented, appears well and in no acute distress  HEENT: atraumatic, conjunctiva clear, no obvious abnormalities on inspection of external nose and ears  NECK: normal movements of the head and neck  LUNGS: on inspection no signs of respiratory distress, breathing rate appears normal, no obvious gross SOB, gasping or wheezing  CV: no obvious cyanosis  MS: moves all visible extremities without noticeable abnormality  PSYCH/NEURO: pleasant and cooperative, no obvious depression or anxiety, speech and thought processing grossly intact  SKIN: no abnormalities noted.  Assessment/Plan  1. Essential hypertension Has been stable. Continue current medication.  2. Hyperlipidemia, unspecified hyperlipidemia type Has been stable. Recheck in next few months. Cont current medication. - Comprehensive  metabolic panel; Future - Lipid panel; Future  3. Hyperglycemia Keep up with healthier eating/exercise. - Hemoglobin A1c; Future  4. Major depressive disorder with single episode, in full remission ([place]) Stable. Continue current medication.  5. Gastritis, presence of bleeding unspecified, unspecified chronicity, unspecified gastritis type Has improved with omeprazole. Has follow up with specialist next week. Still getting some abdominal discomfort with eating.  6. Iron deficiency anemia due to chronic blood loss - CBC with Differential/Platelet; Future - IBC + Ferritin; Future - ferrous sulfate 325 (65 FE) MG tablet; Take 1 tablet (325 mg total) by mouth daily with breakfast.; Refill: 3  7. Low serum vitamin B12 - vitamin B-12 100 MCG tablet; Take 1 tablet (100 mcg total) by mouth daily. - Vitamin B12; Future   Return labwork then physical.   I discussed the assessment and treatment plan with the patient. The patient was provided an opportunity to ask questions and all were answered. The patient agreed with the  plan and demonstrated an understanding of the instructions.   The patient was advised to call back or seek an in-person evaluation if the symptoms worsen or if the condition fails to improve as anticipated.  I provided 25 minutes of non-face-to-face time during this encounter.   Micheline Rough, MD

## 2018-07-10 ENCOUNTER — Ambulatory Visit: Payer: 59 | Admitting: Gastroenterology

## 2018-07-17 ENCOUNTER — Encounter: Payer: Self-pay | Admitting: Family Medicine

## 2018-07-19 ENCOUNTER — Other Ambulatory Visit: Payer: Self-pay | Admitting: Family Medicine

## 2018-07-19 DIAGNOSIS — Z1231 Encounter for screening mammogram for malignant neoplasm of breast: Secondary | ICD-10-CM

## 2018-07-31 ENCOUNTER — Other Ambulatory Visit: Payer: Self-pay | Admitting: Family Medicine

## 2018-07-31 MED FILL — OMEPRAZOLE 40 MG CPDR: 40 | 90 days supply | Qty: 180 | Fill #0

## 2018-07-31 MED FILL — PRAVASTATIN SODIUM 20 MG TA: 20 | 90 days supply | Qty: 90 | Fill #0

## 2018-07-31 MED FILL — FLUoxetine HCL 40 MG CAPS: 40 | 90 days supply | Qty: 90 | Fill #0

## 2018-08-02 MED FILL — AMLODIPINE BESYLATE 5 MG TA: 5 | 90 days supply | Qty: 90 | Fill #0

## 2018-08-21 ENCOUNTER — Encounter: Payer: Self-pay | Admitting: Family Medicine

## 2018-08-21 ENCOUNTER — Encounter (INDEPENDENT_AMBULATORY_CARE_PROVIDER_SITE_OTHER): Payer: Self-pay | Admitting: Family Medicine

## 2018-08-21 NOTE — Telephone Encounter (Signed)
I called Dr Migdalia Dk office and spoke with Maudie Mercury.  She stated the diagnoses below do not meet the criteria as the BMI has to be 30+.  Message sent to Dr Ethlyn Gallery.

## 2018-08-21 NOTE — Telephone Encounter (Signed)
Can you find out about this? Are there other criteria that could get her into program? She has some impaired fasting glucose, elevated lipids. Can we refer?   If not let me know!

## 2018-08-21 NOTE — Telephone Encounter (Signed)
Please advise 

## 2018-09-05 ENCOUNTER — Ambulatory Visit (INDEPENDENT_AMBULATORY_CARE_PROVIDER_SITE_OTHER): Payer: 59 | Admitting: Family Medicine

## 2018-09-06 ENCOUNTER — Ambulatory Visit (INDEPENDENT_AMBULATORY_CARE_PROVIDER_SITE_OTHER): Payer: 59 | Admitting: Psychology

## 2018-09-19 ENCOUNTER — Ambulatory Visit (INDEPENDENT_AMBULATORY_CARE_PROVIDER_SITE_OTHER): Payer: 59 | Admitting: Family Medicine

## 2018-09-25 ENCOUNTER — Other Ambulatory Visit: Payer: 59

## 2018-10-04 ENCOUNTER — Encounter: Payer: 59 | Admitting: Family Medicine

## 2018-10-10 ENCOUNTER — Other Ambulatory Visit: Payer: 59

## 2018-10-11 ENCOUNTER — Other Ambulatory Visit (INDEPENDENT_AMBULATORY_CARE_PROVIDER_SITE_OTHER): Payer: 59

## 2018-10-11 ENCOUNTER — Other Ambulatory Visit: Payer: Self-pay

## 2018-10-11 DIAGNOSIS — E538 Deficiency of other specified B group vitamins: Secondary | ICD-10-CM | POA: Diagnosis not present

## 2018-10-11 DIAGNOSIS — D500 Iron deficiency anemia secondary to blood loss (chronic): Secondary | ICD-10-CM

## 2018-10-11 DIAGNOSIS — R739 Hyperglycemia, unspecified: Secondary | ICD-10-CM | POA: Diagnosis not present

## 2018-10-11 DIAGNOSIS — E785 Hyperlipidemia, unspecified: Secondary | ICD-10-CM | POA: Diagnosis not present

## 2018-10-11 LAB — CBC WITH DIFFERENTIAL/PLATELET
Basophils Absolute: 0 10*3/uL (ref 0.0–0.1)
Basophils Relative: 0.8 % (ref 0.0–3.0)
Eosinophils Absolute: 0.2 10*3/uL (ref 0.0–0.7)
Eosinophils Relative: 4.9 % (ref 0.0–5.0)
HCT: 38.9 % (ref 36.0–46.0)
Hemoglobin: 12.8 g/dL (ref 12.0–15.0)
Lymphocytes Relative: 12 % (ref 12.0–46.0)
Lymphs Abs: 0.5 10*3/uL — ABNORMAL LOW (ref 0.7–4.0)
MCHC: 32.9 g/dL (ref 30.0–36.0)
MCV: 80.9 fl (ref 78.0–100.0)
Monocytes Absolute: 0.3 10*3/uL (ref 0.1–1.0)
Monocytes Relative: 8.6 % (ref 3.0–12.0)
Neutro Abs: 2.9 10*3/uL (ref 1.4–7.7)
Neutrophils Relative %: 73.7 % (ref 43.0–77.0)
Platelets: 260 10*3/uL (ref 150.0–400.0)
RBC: 4.81 Mil/uL (ref 3.87–5.11)
RDW: 15 % (ref 11.5–15.5)
WBC: 3.9 10*3/uL — ABNORMAL LOW (ref 4.0–10.5)

## 2018-10-11 LAB — IBC + FERRITIN
Ferritin: 8 ng/mL — ABNORMAL LOW (ref 10.0–291.0)
Iron: 84 ug/dL (ref 42–145)
Saturation Ratios: 17.2 % — ABNORMAL LOW (ref 20.0–50.0)
Transferrin: 348 mg/dL (ref 212.0–360.0)

## 2018-10-11 LAB — COMPREHENSIVE METABOLIC PANEL
ALT: 42 U/L — ABNORMAL HIGH (ref 0–35)
AST: 28 U/L (ref 0–37)
Albumin: 4.6 g/dL (ref 3.5–5.2)
Alkaline Phosphatase: 134 U/L — ABNORMAL HIGH (ref 39–117)
BUN: 15 mg/dL (ref 6–23)
CO2: 31 mEq/L (ref 19–32)
Calcium: 9.6 mg/dL (ref 8.4–10.5)
Chloride: 104 mEq/L (ref 96–112)
Creatinine, Ser: 0.85 mg/dL (ref 0.40–1.20)
GFR: 69.26 mL/min (ref >60.00)
Glucose, Bld: 93 mg/dL (ref 70–99)
Potassium: 4.7 mEq/L (ref 3.5–5.1)
Sodium: 142 mEq/L (ref 135–145)
Total Bilirubin: 0.4 mg/dL (ref 0.2–1.2)
Total Protein: 7.3 g/dL (ref 6.0–8.3)

## 2018-10-11 LAB — LIPID PANEL
Cholesterol: 239 mg/dL — ABNORMAL HIGH (ref 0–200)
HDL: 66 mg/dL (ref >39.00)
LDL Cholesterol: 148 mg/dL — ABNORMAL HIGH (ref 0–99)
NonHDL: 173.19
Total CHOL/HDL Ratio: 4
Triglycerides: 124 mg/dL (ref 0.0–149.0)
VLDL: 24.8 mg/dL (ref 0.0–40.0)

## 2018-10-11 LAB — HEMOGLOBIN A1C: Hgb A1c MFr Bld: 6.8 % — ABNORMAL HIGH (ref 4.6–6.5)

## 2018-10-11 LAB — VITAMIN B12: Vitamin B-12: 377 pg/mL (ref 211–911)

## 2018-10-12 ENCOUNTER — Ambulatory Visit
Admission: RE | Admit: 2018-10-12 | Discharge: 2018-10-12 | Disposition: A | Discharge status: Home / Self Care | Payer: 59 | Source: Ambulatory Visit | Attending: Family Medicine | Admitting: Family Medicine

## 2018-10-12 ENCOUNTER — Other Ambulatory Visit: Payer: Self-pay

## 2018-10-12 DIAGNOSIS — Z1231 Encounter for screening mammogram for malignant neoplasm of breast: Secondary | ICD-10-CM | POA: Diagnosis not present

## 2018-10-16 ENCOUNTER — Other Ambulatory Visit: Payer: Self-pay

## 2018-10-16 ENCOUNTER — Encounter: Payer: Self-pay | Admitting: Family Medicine

## 2018-10-16 ENCOUNTER — Ambulatory Visit (INDEPENDENT_AMBULATORY_CARE_PROVIDER_SITE_OTHER): Payer: 59 | Admitting: Family Medicine

## 2018-10-16 VITALS — BP 100/80 | HR 64 | Temp 97.5°F | Ht 62.75 in | Wt 165.4 lb

## 2018-10-16 DIAGNOSIS — I10 Essential (primary) hypertension: Secondary | ICD-10-CM | POA: Diagnosis not present

## 2018-10-16 DIAGNOSIS — Z23 Encounter for immunization: Secondary | ICD-10-CM

## 2018-10-16 DIAGNOSIS — Z0000 Encounter for general adult medical examination without abnormal findings: Secondary | ICD-10-CM

## 2018-10-16 DIAGNOSIS — E785 Hyperlipidemia, unspecified: Secondary | ICD-10-CM | POA: Diagnosis not present

## 2018-10-16 DIAGNOSIS — E119 Type 2 diabetes mellitus without complications: Secondary | ICD-10-CM | POA: Diagnosis not present

## 2018-10-16 MED ORDER — BLOOD GLUCOSE MONITOR KIT: PACK | 0 refills | Status: DC

## 2018-10-16 MED ORDER — PRAVASTATIN SODIUM 40 MG PO TABS: 40.0000 mg | ORAL_TABLET | Freq: Every day | ORAL | 3 refills | Status: DC

## 2018-10-16 MED ORDER — METFORMIN HCL ER 500 MG PO TB24: 500.0000 mg | ORAL_TABLET | Freq: Every day | ORAL | 1 refills | Status: DC

## 2018-10-16 MED ORDER — LOSARTAN POTASSIUM 50 MG PO TABS: 50.0000 mg | ORAL_TABLET | Freq: Every day | ORAL | 3 refills | Status: DC

## 2018-10-16 MED FILL — PRAVASTATIN NA 40 MG TAB: 40 | 90 days supply | Qty: 90 | Fill #0

## 2018-10-16 MED FILL — LOSARTAN POTASSIUM 50 MG TA: 50 | 90 days supply | Qty: 90 | Fill #0

## 2018-10-16 MED FILL — metFORMIN HCL ER 500 MG TB2: 500 | 90 days supply | Qty: 90 | Fill #0

## 2018-10-16 NOTE — Progress Notes (Signed)
Rachael Hodge DOB: Dec 01, 1965 Encounter date: 10/16/2018  This is a 53 y.o. female who presents for complete physical   History of present illness/Additional concerns: No specific concerns today. Has had hard time with gaining weight since menopause. Even thought about doing keto.   Has completed bloodwork since June visit which needs to be reviewed together in detail:  A1C was 6.8: tends to have lower carb diet in general.   Taking iron daily but ferritin levels are still low: ferritin was 8. She did see hematology in the past. She crashed. Blood pressure dropped; thought she was going to die.   HL: on pravastatin - LDL was 148; previously was 119 HTN: has been on norvasc.   Follows with gyn: last mammogram 10/12/18 Last colonoscopy: 04/29/17: repeat 10 years. Done by Dr. Silverio Decamp.  Doesn't follow regularly with dermatology.   Past Medical History:  Diagnosis Date  . Abnormal Pap smear of cervix    020 LGSIL HPV HR+, hx of LEEP  . Anemia   . Depression   . Eczema   . GERD (gastroesophageal reflux disease)   . Hyperlipidemia   . Hypertension   . Urticaria    Past Surgical History:  Procedure Laterality Date  . COLPOSCOPY  2006  . GIVENS CAPSULE STUDY N/A 05/09/2017   Procedure: GIVENS CAPSULE STUDY;  Surgeon: Mauri Pole, MD;  Location: [facility];  Service: Endoscopy;  Laterality: N/A;   Allergies  Allergen Reactions  . Penicillins Shortness Of Breath  . Sulfa Antibiotics Shortness Of Breath  . Feraheme [Ferumoxytol] Nausea Only and Other (See Comments)    Hypotension and low heart rate  . Lisinopril     cough   Current Meds  Medication Sig  . Crisaborole (EUCRISA) 2 % OINT Apply 1 application topically 2 (two) times daily.  . ferrous sulfate 325 (65 FE) MG tablet Take 1 tablet (325 mg total) by mouth daily with breakfast.  . FLUoxetine (PROZAC) 40 MG capsule Take 1 capsule (40 mg total) by mouth daily.  . hydrOXYzine (ATARAX/VISTARIL) 25 MG tablet Take  1 tablet at bedtime as needed for itching  . omeprazole (PRILOSEC) 40 MG capsule TAKE 1 CAPSULE BY MOUTH TWICE DAILY BEFORE A MEAL  . pimecrolimus (ELIDEL) 1 % cream Apply topically 2 (two) times daily.  . Probiotic Product (PROBIOTIC-10 PO) Take by mouth.  . vitamin B-12 100 MCG tablet Take 1 tablet (100 mcg total) by mouth daily.  . [DISCONTINUED] amLODipine (NORVASC) 5 MG tablet TAKE 1 TABLET BY MOUTH DAILY.  . [DISCONTINUED] pravastatin (PRAVACHOL) 20 MG tablet TAKE 1 TABLET BY MOUTH DAILY.   Social History   Tobacco Use  . Smoking status: Never Smoker  . Smokeless tobacco: Never Used  Substance Use Topics  . Alcohol use: Yes    Alcohol/week: 2.0 standard drinks    Types: 2 Cans of beer per week   Family History  Problem Relation Age of Onset  . Early death Mother 10       died in sleep; uncertain cause  . Hyperlipidemia Father   . Hypertension Father   . Stroke Father 14  . CAD Father 66       CAD, quadruple bypass  . Heart disease Paternal Grandfather 49  . Crohn's disease Son   . Heart attack Maternal Grandmother   . Alzheimer's disease Maternal Grandmother   . Heart attack Maternal Grandfather   . Heart attack Paternal Grandmother   . Diabetes Paternal Grandmother   . Breast  cancer Neg Hx   . Colon cancer Neg Hx      Review of Systems  Constitutional: Negative for activity change, appetite change, chills, fatigue, fever and unexpected weight change.  HENT: Negative for congestion, ear pain, hearing loss, sinus pressure, sinus pain, sore throat and trouble swallowing.   Eyes: Negative for pain and visual disturbance.  Respiratory: Negative for cough, chest tightness, shortness of breath and wheezing.   Cardiovascular: Negative for chest pain, palpitations and leg swelling.  Gastrointestinal: Negative for abdominal pain, blood in stool, constipation, diarrhea, nausea and vomiting.  Genitourinary: Negative for difficulty urinating and menstrual problem.   Musculoskeletal: Negative for arthralgias and back pain.  Skin: Negative for rash.  Neurological: Negative for dizziness, weakness, numbness and headaches.  Hematological: Negative for adenopathy. Does not bruise/bleed easily.  Psychiatric/Behavioral: Negative for sleep disturbance and suicidal ideas. The patient is not nervous/anxious.     CBC:  Lab Results  Component Value Date   WBC 3.9 (L) 10/11/2018   HGB 12.8 10/11/2018   HGB 12.6 03/01/2017   HGB 12.4 10/08/2016   HCT 38.9 10/11/2018   HCT 37.7 10/08/2016   MCH 27.5 03/01/2017   MCHC 32.9 10/11/2018   RDW 15.0 10/11/2018   RDW 16.4 (H) 10/08/2016   PLT 260.0 10/11/2018   PLT 272 03/01/2017   PLT 245 10/08/2016   CMP: Lab Results  Component Value Date   NA 142 10/11/2018   NA 139 10/08/2016   K 4.7 10/11/2018   K 4.4 10/08/2016   CL 104 10/11/2018   CO2 31 10/11/2018   CO2 27 10/08/2016   ANIONGAP 8 03/01/2017   GLUCOSE 93 10/11/2018   GLUCOSE 85 10/08/2016   BUN 15 10/11/2018   BUN 13.4 10/08/2016   CREATININE 0.85 10/11/2018   CREATININE 0.87 03/01/2017   CREATININE 0.9 10/08/2016   LABGLOB 3.0 07/08/2016   GFRAA >60 03/01/2017   CALCIUM 9.6 10/11/2018   CALCIUM 9.7 10/08/2016   PROT 7.3 10/11/2018   PROT 7.2 10/08/2016   BILITOT 0.4 10/11/2018   BILITOT 0.4 03/01/2017   BILITOT 0.42 10/08/2016   ALKPHOS 134 (H) 10/11/2018   ALKPHOS 117 10/08/2016   ALT 42 (H) 10/11/2018   ALT 45 03/01/2017   ALT 34 10/08/2016   AST 28 10/11/2018   AST 29 03/01/2017   AST 30 10/08/2016   LIPID: Lab Results  Component Value Date   CHOL 239 (H) 10/11/2018   TRIG 124.0 10/11/2018   HDL 66.00 10/11/2018   LDLCALC 148 (H) 10/11/2018    Objective:  BP 100/80 (BP Location: Left Arm, Patient Position: Sitting, Cuff Size: Large)   Pulse 64   Temp (!) 97.5 F (36.4 C) (Temporal)   Ht 5' 2.75" (1.594 m)   Wt 165 lb 6.4 oz (75 kg)   LMP 12/02/2012   SpO2 100%   BMI 29.53 kg/m   Weight: 165 lb 6.4 oz (75  kg)   BP Readings from Last 3 Encounters:  10/16/18 100/80  05/03/18 120/70  04/19/18 140/80   Wt Readings from Last 3 Encounters:  10/16/18 165 lb 6.4 oz (75 kg)  05/03/18 159 lb (72.1 kg)  04/19/18 159 lb 12.8 oz (72.5 kg)    Physical Exam Constitutional:      General: She is not in acute distress.    Appearance: She is well-developed.  HENT:     Head: Normocephalic and atraumatic.     Right Ear: External ear normal.     Left Ear: External  ear normal.     Mouth/Throat:     Pharynx: No oropharyngeal exudate.  Eyes:     Conjunctiva/sclera: Conjunctivae normal.     Pupils: Pupils are equal, round, and reactive to light.  Neck:     Musculoskeletal: Normal range of motion and neck supple.     Thyroid: No thyromegaly.  Cardiovascular:     Rate and Rhythm: Normal rate and regular rhythm.     Heart sounds: Normal heart sounds. No murmur. No friction rub. No gallop.   Pulmonary:     Effort: Pulmonary effort is normal.     Breath sounds: Normal breath sounds.  Abdominal:     General: Bowel sounds are normal. There is no distension.     Palpations: Abdomen is soft. There is no mass.     Tenderness: There is no abdominal tenderness. There is no guarding.     Hernia: No hernia is present.  Musculoskeletal: Normal range of motion.        General: No tenderness or deformity.  Lymphadenopathy:     Cervical: No cervical adenopathy.  Skin:    General: Skin is warm and dry.     Findings: No rash.  Neurological:     Mental Status: She is alert and oriented to person, place, and time.     Deep Tendon Reflexes: Reflexes normal.     Reflex Scores:      Tricep reflexes are 2+ on the right side and 2+ on the left side.      Bicep reflexes are 2+ on the right side and 2+ on the left side.      Brachioradialis reflexes are 2+ on the right side and 2+ on the left side.      Patellar reflexes are 2+ on the right side and 2+ on the left side. Psychiatric:        Speech: Speech normal.         Behavior: Behavior normal.        Thought Content: Thought content normal.     Assessment/Plan: Health Maintenance Due  Topic Date Due  . PNEUMOCOCCAL POLYSACCHARIDE VACCINE AGE 35-64 HIGH RISK  02/02/1965  . FOOT EXAM  02/02/1973  . Fecal DNA (Cologuard)  04/04/2018   Health Maintenance reviewed.  1. Preventative health care Discussed importance of regular exercise, healthy eating.  With new diagnosis of diabetes, stressed importance of lower carbohydrate eating.  Diabetic diet reviewed.  See below.  2. Essential hypertension Switching out amlodipine and starting losartan to help with renal protection secondary to new diagnosis of diabetes.. - losartan (COZAAR) 50 MG tablet; Take 1 tablet (50 mg total) by mouth daily.  Dispense: 90 tablet; Refill: 3  3. Hyperlipidemia, unspecified hyperlipidemia type Discussed cholesterol goals with diabetes.  We will add pravastatin to try and reach LDL goal. - pravastatin (PRAVACHOL) 40 MG tablet; Take 1 tablet (40 mg total) by mouth daily.  Dispense: 90 tablet; Refill: 3  4. Controlled type 2 diabetes mellitus without complication, without long-term current use of insulin ([place]) Reviewed diabetic diet and monitoring carbohydrate intake.  After discussion of consideration for starting treatment to help with blood sugar, she elected to start metformin.  We are hopeful that this will help her better monitor blood sugars as well as helping with some aspects of metabolic syndrome including difficulty with losing weight. - blood glucose meter kit and supplies KIT; Dispense based on patient and insurance preference. Use up to four times daily as directed. (FOR ICD-9 250.00,  250.01).  Dispense: 1 each; Refill: 0 - metFORMIN (GLUCOPHAGE-XR) 500 MG 24 hr tablet; Take 1 tablet (500 mg total) by mouth daily with breakfast.  Dispense: 90 tablet; Refill: 1  5. Need for immunization against influenza - Flu Vaccine QUAD 6+ mos PF IM (Fluarix Quad PF)  Return  in about 1 month (around 11/15/2018) for Chronic condition visit.  Micheline Rough, MD

## 2018-10-16 NOTE — Patient Instructions (Addendum)
Start metformin as directed with breakfast.   Stop amlodipine. Start losartan. Monitor blood pressure. If elevated over 135/85 then ok to increase to 2 tablets (113m) of losartan.

## 2018-10-17 ENCOUNTER — Encounter: Payer: Self-pay | Admitting: Family Medicine

## 2018-10-17 MED FILL — FREESTYLE LITE TEST STRIP: 25 days supply | Qty: 100 | Fill #0

## 2018-10-17 MED FILL — FREESTYLE LANCETS: 25 days supply | Qty: 100 | Fill #0

## 2018-10-17 MED FILL — FREESTYLE LITE METER: 30 days supply | Qty: 1 | Fill #0

## 2018-10-18 ENCOUNTER — Encounter: Payer: Self-pay | Admitting: Family Medicine

## 2018-10-19 ENCOUNTER — Encounter: Payer: Self-pay | Admitting: Family Medicine

## 2018-10-19 NOTE — Telephone Encounter (Signed)
I called the Rachael Hodge and offered a virtual visit today with Dr Maudie Mercury as Dr Ethlyn Gallery is out of the office until tomorrow.  Patient denies any chest pain, blurred vision, dizziness, lightheadedness or other symptoms, declined the appt and stated she would prefer Dr Ethlyn Gallery advise her tomorrow.

## 2018-11-15 ENCOUNTER — Other Ambulatory Visit: Payer: Self-pay

## 2018-11-15 ENCOUNTER — Ambulatory Visit: Payer: 59 | Admitting: Family Medicine

## 2018-11-15 ENCOUNTER — Encounter: Payer: Self-pay | Admitting: Family Medicine

## 2018-11-15 VITALS — BP 112/70 | HR 78 | Temp 97.8°F | Ht 62.75 in | Wt 162.4 lb

## 2018-11-15 DIAGNOSIS — E785 Hyperlipidemia, unspecified: Secondary | ICD-10-CM

## 2018-11-15 DIAGNOSIS — Z23 Encounter for immunization: Secondary | ICD-10-CM

## 2018-11-15 DIAGNOSIS — E119 Type 2 diabetes mellitus without complications: Secondary | ICD-10-CM

## 2018-11-15 DIAGNOSIS — I10 Essential (primary) hypertension: Secondary | ICD-10-CM | POA: Diagnosis not present

## 2018-11-15 MED FILL — FLUoxetine HCL 40 MG CAPS: 40 | 90 days supply | Qty: 90 | Fill #1

## 2018-11-15 NOTE — Progress Notes (Signed)
Rachael Hodge DOB: October 10, 1965 Encounter date: 11/15/2018  This is a 53 y.o. female who presents with Chief Complaint  Patient presents with  . Follow-up    History of present illness: Discussed new dx of diabetes at last visit. We switched out amlodipine for losartan for renal protection. Metformin was started. Pravastatin also added for tighter LDL control. Repeat A1C in December.   First couple of weeks were difficult. Doing pretty well now. Happy with weight loss. Has eliminated sugar. This was a "detox" process for her. Has been trying to push self with exercise. Feels like it is harder with working from home. Put block on work calendar and takes time on elliptical. Doing ok with new medications. Hasn't noticed anything weird. Checking sugars in morning and has been in 80-90's.   Blood pressure has been fine - higher at home than here today. But also not sitting down at home. Has been in 120/70 range.   Allergies  Allergen Reactions  . Penicillins Shortness Of Breath  . Sulfa Antibiotics Shortness Of Breath  . Feraheme [Ferumoxytol] Nausea Only and Other (See Comments)    Hypotension and low heart rate  . Lisinopril     cough   Current Meds  Medication Sig  . blood glucose meter kit and supplies KIT Dispense based on patient and insurance preference. Use up to four times daily as directed. (FOR ICD-9 250.00, 250.01).  . ferrous sulfate 325 (65 FE) MG tablet Take 1 tablet (325 mg total) by mouth daily with breakfast.  . FLUoxetine (PROZAC) 40 MG capsule Take 1 capsule (40 mg total) by mouth daily.  [name] losartan (COZAAR) 50 MG tablet Take 1 tablet (50 mg total) by mouth daily.  . metFORMIN (GLUCOPHAGE-XR) 500 MG 24 hr tablet Take 1 tablet (500 mg total) by mouth daily with breakfast.  . omeprazole (PRILOSEC) 40 MG capsule TAKE 1 CAPSULE BY MOUTH TWICE DAILY BEFORE A MEAL  . pravastatin (PRAVACHOL) 40 MG tablet Take 1 tablet (40 mg total) by mouth daily.  . vitamin B-12 100  MCG tablet Take 1 tablet (100 mcg total) by mouth daily.    Review of Systems  Constitutional: Negative for chills, fatigue and fever.  Respiratory: Negative for cough, chest tightness, shortness of breath and wheezing.   Cardiovascular: Negative for chest pain, palpitations and leg swelling.    Objective:  BP 112/70 (BP Location: Left Arm, Patient Position: Sitting, Cuff Size: Large)   Pulse 78   Temp 97.8 F (36.6 C) (Temporal)   Ht 5' 2.75" (1.594 m)   Wt 162 lb 6.4 oz (73.7 kg)   LMP 12/02/2012   SpO2 98%   BMI 29.00 kg/m   Weight: 162 lb 6.4 oz (73.7 kg)   BP Readings from Last 3 Encounters:  11/15/18 112/70  10/16/18 100/80  05/03/18 120/70   Wt Readings from Last 3 Encounters:  11/15/18 162 lb 6.4 oz (73.7 kg)  10/16/18 165 lb 6.4 oz (75 kg)  05/03/18 159 lb (72.1 kg)    Physical Exam Constitutional:      General: She is not in acute distress.    Appearance: She is well-developed.  HENT:     Head: Normocephalic and atraumatic.  Neck:     Musculoskeletal: Neck supple.  Cardiovascular:     Rate and Rhythm: Normal rate and regular rhythm.     Heart sounds: Normal heart sounds. No murmur.  Pulmonary:     Effort: Pulmonary effort is normal.     Breath  sounds: Normal breath sounds.  Abdominal:     General: Bowel sounds are normal. There is no distension.     Palpations: Abdomen is soft.     Tenderness: There is no abdominal tenderness. There is no guarding.  Skin:    General: Skin is warm and dry.     Comments: Sensory exam of the foot is normal, tested with the monofilament. Good pulses, no lesions or ulcers, good peripheral pulses.  Psychiatric:        Judgment: Judgment normal.     Assessment/Plan  1. Controlled type 2 diabetes mellitus without complication, without long-term current use of insulin ([place]) Doing great with healthy eating.  - Hemoglobin A1c; Future - Microalbumin / creatinine urine ratio; Future  2. Hyperlipidemia, unspecified  hyperlipidemia type Continue with pravastatin.   3. Essential hypertension Well controlled. Tolerating the losartan well.     Return in about 6 months (around 05/16/2019) for please schedule bloodwork in December; the [facility] in 6 months.    Micheline Rough, MD

## 2018-11-20 DIAGNOSIS — H40013 Open angle with borderline findings, low risk, bilateral: Secondary | ICD-10-CM | POA: Diagnosis not present

## 2018-11-20 DIAGNOSIS — H524 Presbyopia: Secondary | ICD-10-CM | POA: Diagnosis not present

## 2018-11-20 DIAGNOSIS — E119 Type 2 diabetes mellitus without complications: Secondary | ICD-10-CM | POA: Diagnosis not present

## 2018-11-20 DIAGNOSIS — H5211 Myopia, right eye: Secondary | ICD-10-CM | POA: Diagnosis not present

## 2019-01-03 MED FILL — METFORMIN HCL ER 500 MG TB2: 500 | 90 days supply | Qty: 90 | Fill #1

## 2019-01-03 MED FILL — PRAVASTATIN NA 40 MG TAB: 40 | 90 days supply | Qty: 90 | Fill #1

## 2019-01-03 MED FILL — LOSARTAN POTASSIUM 50 MG TA: 50 | 90 days supply | Qty: 90 | Fill #1

## 2019-01-11 ENCOUNTER — Encounter: Payer: Self-pay | Admitting: Family Medicine

## 2019-01-15 ENCOUNTER — Other Ambulatory Visit: Payer: 59

## 2019-02-09 ENCOUNTER — Encounter: Payer: Self-pay | Admitting: Family Medicine

## 2019-02-12 ENCOUNTER — Other Ambulatory Visit (INDEPENDENT_AMBULATORY_CARE_PROVIDER_SITE_OTHER): Payer: 59

## 2019-02-12 ENCOUNTER — Other Ambulatory Visit: Payer: Self-pay

## 2019-02-12 ENCOUNTER — Other Ambulatory Visit: Payer: Self-pay | Admitting: Family Medicine

## 2019-02-12 DIAGNOSIS — E119 Type 2 diabetes mellitus without complications: Secondary | ICD-10-CM

## 2019-02-12 LAB — MICROALBUMIN / CREATININE URINE RATIO
Creatinine,U: 131.8 mg/dL
Microalb Creat Ratio: 0.5 mg/g (ref 0.0–30.0)
Microalb, Ur: <0.7 mg/dL (ref 0.0–1.9)

## 2019-02-12 LAB — HEMOGLOBIN A1C: Hgb A1c MFr Bld: 6.5 % (ref 4.6–6.5)

## 2019-02-12 MED FILL — OMEPRAZOLE 40 MG CPDR: 40 | 90 days supply | Qty: 180 | Fill #1

## 2019-02-13 MED FILL — FLUoxetine HCL 40 MG CAPS: 40 | 90 days supply | Qty: 90 | Fill #0

## 2019-03-27 ENCOUNTER — Other Ambulatory Visit: Payer: Self-pay | Admitting: Family Medicine

## 2019-03-27 DIAGNOSIS — E119 Type 2 diabetes mellitus without complications: Secondary | ICD-10-CM

## 2019-03-27 MED FILL — PRAVASTATIN NA 40 MG TAB: 40 | 90 days supply | Qty: 90 | Fill #2

## 2019-03-27 MED FILL — metFORMIN HCL ER 500 MG TB2: 500 | 90 days supply | Qty: 90 | Fill #0

## 2019-03-27 MED FILL — LOSARTAN POTASSIUM 50 MG TA: 50 | 90 days supply | Qty: 90 | Fill #2

## 2019-04-25 ENCOUNTER — Other Ambulatory Visit: Payer: Self-pay

## 2019-04-25 NOTE — Progress Notes (Signed)
54 y.o. [id number] Married White or Caucasian Not Hispanic or Latino female here for annual exam.  Not currently sexually active secondary to husbands medical issues.   Diagnosed with diabetes in the fall, on metformin. Doing well. Trying to loose weight. She is walking 1.9 miles a day. Eliminating sugar, bread.   She has had her covid vaccines.    Patient's last menstrual period was 12/02/2012.          Sexually active: Yes.    The current method of family planning is post menopausal status.    Exercising: Yes.    walking  Smoker:  no  Health Maintenance: Pap:  04/19/18 LSIL HR HPV +, 03/25/2017 LSIL NEG HPV, 02-12-16 WNL + HR HPV negative 16/18/45, 08-24-12 WNL NEG HR HPV  History of abnormal Pap:  Yes 2019 and 2020 H/O LEEP 2010. Colposcopy in 4/20 unsatisfactory, ECC with CIN I, vaginal biopsy negative.  MMG:  10/12/2018 density b  Bi-rads 1 neg  BMD:  Never  Colonoscopy: 04/29/2017 polyps, f/u in 5-10 years.  TDaP:05-11-13 Gardasil:N/A   reports that she has never smoked. She has never used smokeless tobacco. She reports current alcohol use of about 2.0 standard drinks of alcohol per week. She reports that she does not use drugs. 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 Son's are 23 and 82. 2 dogs, pit bull and boxer/pit bull puppy  Past Medical History:  Diagnosis Date  . Abnormal Pap smear of cervix    020 LGSIL HPV HR+, hx of LEEP  . Anemia   . Depression   . Eczema   . GERD (gastroesophageal reflux disease)   . Hyperlipidemia   . Hypertension   . Urticaria     Past Surgical History:  Procedure Laterality Date  . COLPOSCOPY  2006  . GIVENS CAPSULE STUDY N/A 05/09/2017   Procedure: GIVENS CAPSULE STUDY;  Surgeon: Mauri Pole, MD;  Location: [facility];  Service: Endoscopy;  Laterality: N/A;    Current Outpatient Medications  Medication Sig Dispense Refill  . blood glucose meter kit and supplies KIT Dispense based on patient and insurance preference. Use up to  four times daily as directed. (FOR ICD-9 250.00, 250.01). 1 each 0  . ferrous sulfate 325 (65 FE) MG tablet Take 1 tablet (325 mg total) by mouth daily with breakfast.  3  . FLUoxetine (PROZAC) 40 MG capsule TAKE 1 CAPSULE (40 MG TOTAL) BY MOUTH DAILY. 90 capsule 1  . losartan (COZAAR) 50 MG tablet Take 1 tablet (50 mg total) by mouth daily. 90 tablet 3  . metFORMIN (GLUCOPHAGE-XR) 500 MG 24 hr tablet TAKE 1 TABLET BY MOUTH ONCE DAILY WITH BREAKFAST 90 tablet 1  . omeprazole (PRILOSEC) 40 MG capsule TAKE 1 CAPSULE BY MOUTH TWICE DAILY BEFORE A MEAL 180 capsule 3  . pravastatin (PRAVACHOL) 40 MG tablet Take 1 tablet (40 mg total) by mouth daily. 90 tablet 3  . vitamin B-12 100 MCG tablet Take 1 tablet (100 mcg total) by mouth daily.     No current facility-administered medications for this visit.    Family History  Problem Relation Age of Onset  . Early death Mother 67       died in sleep; uncertain cause  . Hyperlipidemia Father   . Hypertension Father   . Stroke Father 79  . CAD Father 25       CAD, quadruple bypass  . Heart disease Paternal Grandfather 29  . Crohn's disease Son   . Heart  attack Maternal Grandmother   . Alzheimer's disease Maternal Grandmother   . Heart attack Maternal Grandfather   . Heart attack Paternal Grandmother   . Diabetes Paternal Grandmother   . Breast cancer Neg Hx   . Colon cancer Neg Hx   Both sons with Crohn's disease  Review of Systems  All other systems reviewed and are negative.   Exam:   BP 126/68   Pulse 78   Temp 97.9 F (36.6 C)   Ht 5' 2.5" (1.588 m)   Wt 158 lb (71.7 kg)   LMP 12/02/2012   SpO2 97%   BMI 28.44 kg/m   Weight change: @WEIGHTCHANGE @ Height:   Height: 5' 2.5" (158.8 cm)  Ht Readings from Last 3 Encounters:  04/26/19 5' 2.5" (1.588 m)  11/15/18 5' 2.75" (1.594 m)  10/16/18 5' 2.75" (1.594 m)    General appearance: alert, cooperative and appears stated age Head: Normocephalic, without obvious abnormality,  atraumatic Neck: no adenopathy, supple, symmetrical, trachea midline and thyroid normal to inspection and palpation Lungs: clear to auscultation bilaterally Cardiovascular: regular rate and rhythm Breasts: normal appearance, no masses or tenderness Abdomen: soft, non-tender; non distended,  no masses,  no organomegaly Extremities: extremities normal, atraumatic, no cyanosis or edema Skin: Skin color, texture, turgor normal. No rashes or lesions Lymph nodes: Cervical, supraclavicular, and axillary nodes normal. No abnormal inguinal nodes palpated Neurologic: Grossly normal   Pelvic: External genitalia:  no lesions              Urethra:  normal appearing urethra with no masses, tenderness or lesions              Bartholins and Skenes: normal                 Vagina: mildly atrophic appearing vagina with normal color and discharge, no lesions              Cervix: no lesions               Bimanual Exam:  Uterus:  normal size, contour, position, consistency, mobility, non-tender              Adnexa: no mass, fullness, tenderness               Rectovaginal: Confirms               Anus:  normal sphincter tone, no lesions  Gae Dry chaperoned for the exam.  A:  Well Woman with normal exam  CIN I  DM, HTN, elevated lipids managed by primary  P:   Pap with hpv  Mammogram in 9/21  Colonoscopy UTD  Discussed breast self exam  Discussed calcium and vit D intake  Working on weight loss

## 2019-04-26 ENCOUNTER — Encounter: Payer: Self-pay | Admitting: Obstetrics and Gynecology

## 2019-04-26 ENCOUNTER — Other Ambulatory Visit (HOSPITAL_COMMUNITY)
Admission: RE | Admit: 2019-04-26 | Discharge: 2019-04-26 | Disposition: A | Discharge status: Home / Self Care | Payer: 59 | Source: Ambulatory Visit | Attending: Obstetrics and Gynecology | Admitting: Obstetrics and Gynecology

## 2019-04-26 ENCOUNTER — Ambulatory Visit: Payer: 59 | Admitting: Obstetrics and Gynecology

## 2019-04-26 VITALS — BP 126/68 | HR 78 | Temp 97.9°F | Ht 62.5 in | Wt 158.0 lb

## 2019-04-26 DIAGNOSIS — Z124 Encounter for screening for malignant neoplasm of cervix: Secondary | ICD-10-CM

## 2019-04-26 DIAGNOSIS — Z01419 Encounter for gynecological examination (general) (routine) without abnormal findings: Secondary | ICD-10-CM

## 2019-04-26 DIAGNOSIS — Z87410 Personal history of cervical dysplasia: Secondary | ICD-10-CM

## 2019-04-26 DIAGNOSIS — R87612 Low grade squamous intraepithelial lesion on cytologic smear of cervix (LGSIL): Secondary | ICD-10-CM | POA: Diagnosis not present

## 2019-04-26 NOTE — Patient Instructions (Signed)

## 2019-04-30 LAB — CYTOLOGY - PAP
Comment: NEGATIVE
High risk HPV: POSITIVE — AB

## 2019-05-01 ENCOUNTER — Other Ambulatory Visit: Payer: Self-pay | Admitting: *Deleted

## 2019-05-01 DIAGNOSIS — R87612 Low grade squamous intraepithelial lesion on cytologic smear of cervix (LGSIL): Secondary | ICD-10-CM

## 2019-05-01 DIAGNOSIS — B977 Papillomavirus as the cause of diseases classified elsewhere: Secondary | ICD-10-CM

## 2019-05-02 ENCOUNTER — Telehealth: Payer: Self-pay | Admitting: Obstetrics and Gynecology

## 2019-05-02 NOTE — Telephone Encounter (Signed)
Call placed to convey benefits. Spoke with the patient and conveyed the benefits. Patient understands/agreeable with the benefits. Patient is aware of the cancellation policy. Appointment scheduled 05/23/19.

## 2019-05-16 ENCOUNTER — Ambulatory Visit: Payer: 59 | Admitting: Family Medicine

## 2019-05-19 MED FILL — FLUoxetine HCL 40 MG CAPS: 40 | 90 days supply | Qty: 90 | Fill #1

## 2019-05-22 ENCOUNTER — Other Ambulatory Visit: Payer: Self-pay

## 2019-05-23 ENCOUNTER — Other Ambulatory Visit (HOSPITAL_COMMUNITY)
Admission: RE | Admit: 2019-05-23 | Discharge: 2019-05-23 | Disposition: A | Discharge status: Home / Self Care | Payer: 59 | Source: Ambulatory Visit | Attending: Obstetrics and Gynecology | Admitting: Obstetrics and Gynecology

## 2019-05-23 ENCOUNTER — Encounter: Payer: Self-pay | Admitting: Obstetrics and Gynecology

## 2019-05-23 ENCOUNTER — Other Ambulatory Visit: Payer: Self-pay

## 2019-05-23 ENCOUNTER — Ambulatory Visit (INDEPENDENT_AMBULATORY_CARE_PROVIDER_SITE_OTHER): Payer: 59 | Admitting: Obstetrics and Gynecology

## 2019-05-23 DIAGNOSIS — B977 Papillomavirus as the cause of diseases classified elsewhere: Secondary | ICD-10-CM

## 2019-05-23 DIAGNOSIS — R87810 Cervical high risk human papillomavirus (HPV) DNA test positive: Secondary | ICD-10-CM | POA: Diagnosis present

## 2019-05-23 DIAGNOSIS — N870 Mild cervical dysplasia: Secondary | ICD-10-CM | POA: Diagnosis not present

## 2019-05-23 DIAGNOSIS — N891 Moderate vaginal dysplasia: Secondary | ICD-10-CM | POA: Insufficient documentation

## 2019-05-23 DIAGNOSIS — R87612 Low grade squamous intraepithelial lesion on cytologic smear of cervix (LGSIL): Secondary | ICD-10-CM

## 2019-05-23 NOTE — Progress Notes (Signed)
GYNECOLOGY  VISIT   HPI: 54 y.o.   Married White or Caucasian Not Hispanic or Latino  female   [phone] with Patient's last menstrual period was 12/02/2012.   here for colposcopy for LSIL pap with +HPV  Prior pap history: Pap:  04/19/18 LSIL HR HPV +, 03/25/2017 LSIL NEG HPV,02-12-16 WNL + HR HPV negative 16/18/45, 08-24-12 WNL NEG HR HPV  History of abnormal Pap:  Yes 2019 and 2020 H/O LEEP 2010. Colposcopy in 4/20 unsatisfactory, ECC with CIN I, vaginal biopsy negative.   GYNECOLOGIC HISTORY: Patient's last menstrual period was 12/02/2012. Contraception:none  Menopausal hormone therapy: none         OB History    Gravida  2   Para  2   Term  2   Preterm      AB      Living  2     SAB      TAB      Ectopic      Multiple      Live Births  2              Patient Active Problem List   Diagnosis Date Noted  . Diabetes mellitus type II, controlled ([place]) 10/16/2018  . Gastritis 07/03/2018  . Low serum vitamin B12 07/03/2018  . Heme positive stool   . Iron deficiency anemia 10/08/2016  . Hyperlipemia 09/29/2015  . Essential hypertension 09/29/2015  . Major depressive disorder with single episode, in full remission ([place]) 09/29/2015    Past Medical History:  Diagnosis Date  . Abnormal Pap smear of cervix    020 LGSIL HPV HR+, hx of LEEP  . Anemia   . Depression   . Eczema   . GERD (gastroesophageal reflux disease)   . Hyperlipidemia   . Hypertension   . Urticaria     Past Surgical History:  Procedure Laterality Date  . COLPOSCOPY  2006  . GIVENS CAPSULE STUDY N/A 05/09/2017   Procedure: GIVENS CAPSULE STUDY;  Surgeon: Mauri Pole, MD;  Location: [facility];  Service: Endoscopy;  Laterality: N/A;    Current Outpatient Medications  Medication Sig Dispense Refill  . blood glucose meter kit and supplies KIT Dispense based on patient and insurance preference. Use up to four times daily as directed. (FOR ICD-9 250.00, 250.01). 1 each 0  .  ferrous sulfate 325 (65 FE) MG tablet Take 1 tablet (325 mg total) by mouth daily with breakfast.  3  . FLUoxetine (PROZAC) 40 MG capsule TAKE 1 CAPSULE (40 MG TOTAL) BY MOUTH DAILY. 90 capsule 1  . losartan (COZAAR) 50 MG tablet Take 1 tablet (50 mg total) by mouth daily. 90 tablet 3  . metFORMIN (GLUCOPHAGE-XR) 500 MG 24 hr tablet TAKE 1 TABLET BY MOUTH ONCE DAILY WITH BREAKFAST 90 tablet 1  . omeprazole (PRILOSEC) 40 MG capsule TAKE 1 CAPSULE BY MOUTH TWICE DAILY BEFORE A MEAL 180 capsule 3  . pravastatin (PRAVACHOL) 40 MG tablet Take 1 tablet (40 mg total) by mouth daily. 90 tablet 3  . vitamin B-12 100 MCG tablet Take 1 tablet (100 mcg total) by mouth daily.     No current facility-administered medications for this visit.     ALLERGIES: Penicillins, Sulfa antibiotics, Feraheme [ferumoxytol], and Lisinopril  Family History  Problem Relation Age of Onset  . Early death Mother 11       died in sleep; uncertain cause  . Hyperlipidemia Father   . Hypertension Father   . Stroke Father  37  . CAD Father 43       CAD, quadruple bypass  . Heart disease Paternal Grandfather 60  . Crohn's disease Son   . Heart attack Maternal Grandmother   . Alzheimer's disease Maternal Grandmother   . Heart attack Maternal Grandfather   . Heart attack Paternal Grandmother   . Diabetes Paternal Grandmother   . Breast cancer Neg Hx   . Colon cancer Neg Hx     Social History   Socioeconomic History  . Marital status: Married    Spouse name: Not on file  . Number of children: Not on file  . Years of education: Not on file  . Highest education level: Not on file  Occupational History  . Not on file  Tobacco Use  . Smoking status: Never Smoker  . Smokeless tobacco: Never Used  Substance and Sexual Activity  . Alcohol use: Yes    Alcohol/week: 2.0 standard drinks    Types: 2 Cans of beer per week  . Drug use: No  . Sexual activity: Yes    Partners: Male    Birth control/protection:  Post-menopausal  Other Topics Concern  . 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 Difficulty of Paying Living Expenses:   Food Insecurity:   . Worried About Charity fundraiser in the Last Year:   . Arboriculturist in the Last Year:   Transportation Needs:   . Film/video editor (Medical):   [name] Lack of Transportation (Non-Medical):   Physical Activity:   . Days of Exercise per Week:   . Minutes of Exercise per Session:   Stress:   . Feeling of Stress :   Social Connections:   . Frequency of Communication with Friends and Family:   . Frequency of Social Gatherings with Friends and Family:   . Attends Religious Services:   . Active Member of Clubs or Organizations:   . Attends Archivist Meetings:   [name] Marital Status:   Intimate Partner Violence:   . Fear of Current or Ex-Partner:   . Emotionally Abused:   [name] Physically Abused:   . Sexually Abused:     Review of Systems  Constitutional: Negative.   HENT: Negative.   Eyes: Negative.   Respiratory: Negative.   Cardiovascular: Negative.   Gastrointestinal: Negative.   Genitourinary: Negative.   Musculoskeletal: Negative.   Skin: Negative.   Neurological: Negative.   Endo/Heme/Allergies: Negative.   Psychiatric/Behavioral: Negative.     PHYSICAL EXAMINATION:    BP 130/60   Pulse 82   Temp (!) 97.3 F (36.3 C)   Ht 5' 2"  (1.575 m)   Wt 164 lb (74.4 kg)   LMP 12/02/2012   SpO2 100%   BMI 30.00 kg/m     General appearance: alert, cooperative and appears stated age  Pelvic: External genitalia:  no lesions              Urethra:  normal appearing urethra with no masses, tenderness or lesions              Bartholins and Skenes: normal  Vagina: normal appearing vagina with normal color and discharge, no lesions              Cervix: no lesions               Colposcopy: unsatisfactory, no aceto-white changes, TZ completely inside her cervix. Lugols examination of the cervix and upper vagina, small area of decreased lugols uptake on the vaginal wall at 4 o'clock, biopsy done. Hemostasis obtained with minimal use of silver nitrate. Needed to place a tenaculum to perform ECC, ECC done, tenaculum removed.   Chaperone was present for exam.  ASSESSMENT Recurrent LSIL, +HPV H/O leep in 2010 then normal paps until 2018    PLAN Depending on the biopsy results we discussed the option of continued surveillance or LEEP. Leep information was given

## 2019-05-23 NOTE — Patient Instructions (Signed)
Colposcopy Post-procedure Instructions . Cramping is common.  You may take Ibuprofen, Aleve, or Tylenol for the cramping.  This should resolve within the next two to three days.   . You may have bright red spotting or blackish discharge for several days after your procedure.  The discharge occurs because of a topical solution used to stop bleeding at the biopsy site(s).  You should wear a mini pad for the next few days. . Refrain from putting anything in the vagina until the bleeding and/or discharge stops (usually less than a week). . You need to call the office if you have any pelvic pain, fever, heavy bleeding, or foul smelling vaginal discharge. . Shower or bathe as normal . You will be notified within one week of your biopsy results or we will discuss your results at your follow-up appointment if needed.  Loop Electrosurgical Excision Procedure Loop electrosurgical excision procedure (LEEP) is the cutting and removal (excision) of tissue from the cervix. The cervix is the bottom part of the uterus that opens into the vagina. The tissue that is removed from the cervix is examined to see if there are precancerous cells or cancer cells present. LEEP may be done when:  You have abnormal bleeding from your cervix.  You have an abnormal Pap test result.  Your health care provider finds an abnormality on your cervix during a pelvic exam. LEEP typically only takes a few minutes and is often done in the health care provider's office. The procedure is safe for women who are trying to get pregnant. However, the procedure is usually not done during a menstrual period or during pregnancy. Tell a health care provider about:  Any allergies you have.  All medicines you are taking, including vitamins, herbs, eye drops, creams, and over-the-counter medicines.  Any blood disorders you have.  Any medical conditions you have, including current or past vaginal infections such as herpes or  sexually-transmitted infections (STIs).  Whether you are pregnant or may be pregnant.  Whether or not you are having vaginal bleeding on the day of the procedure. What are the risks? Generally, this is a safe procedure. However, problems may occur, including:  Infection.  Bleeding.  Allergic reactions to medicines.  Changes or scarring in the cervix.  Increased risk of early (preterm) labor in future pregnancies. What happens before the procedure?  Ask your health care provider about: ? Changing or stopping your regular medicines. This is especially important if you are taking diabetes medicines or blood thinners. ? Taking medicines such as aspirin and ibuprofen. These medicines can thin your blood. Do not take these medicines unless your health care provider tells you to take them. ? Taking over-the-counter medicines, vitamins, herbs, and supplements.  Your health care provider may recommend that you take pain medicine before the procedure.  Ask your health care provider if you should plan to have someone take you home after the procedure. What happens during the procedure?   An instrument called a speculum will be placed in your vagina. This will allow your health care provider to see your cervix.  You will be given a medicine to numb the area (local anesthetic). The medicine will be injected into your cervix and the surrounding area.  A solution will be applied to your cervix. This solution will help the health care provider find the abnormal cells that need to be removed.  A thin wire loop will be passed through your vagina. The wire will be used to burn (cauterize)  the cervical tissue with an electrical current.  You may feel faint during the procedure. Tell your health care provider right away if you feel this way.  The abnormal cervical tissue will be removed.  Any open blood vessels will be cauterized to prevent bleeding.  A paste may be applied to the cauterized  area of your cervix to help prevent bleeding.  The sample of cervical tissue will be examined under a microscope. The procedure may vary among health care providers and hospitals. What can I expect after the procedure? After the procedure, it is common to have:  Mild abdominal cramps that are similar to menstrual cramps. These may last for up to 1 week.  A small amount of pink-tinged or bloody vaginal discharge, including light to moderate bleeding, for 1-2 weeks.  A dark-colored discharge coming from your vagina. This is from the paste that was used on the cervix to prevent bleeding. It is up to you to get the results of your procedure. Ask your health care provider, or the department that is doing the procedure, when your results will be ready. Follow these instructions at home:  Take over-the-counter and prescription medicines only as told by your health care provider.  Return to your normal activities as told by your health care provider. Ask your health care provider what activities are safe for you.  Do not put anything in your vagina for 2 weeks after the procedure or until your health care provider says that it is okay. This includes tampons, creams, and douches.  Do not have sex until your health care provider approves.  Keep all follow-up visits as told by your health care provider. This is important. Contact a health care provider if you:  Have a fever or chills.  Feel unusually weak.  Have vaginal bleeding that is heavier or longer than a normal menstrual cycle. A sign of this can be soaking a pad with blood or bleeding with clots.  Develop a bad smelling vaginal discharge.  Have severe abdominal pain or cramping. Summary  Loop electrosurgical excision procedure (LEEP) is the removal of tissue from the cervix. The removed tissue will be checked for precancerous cells or cancer cells.  LEEP typically only takes a few minutes and is often done in the health care  provider's office.  Do not put anything in your vagina for 2 weeks after the procedure or until your health care provider says that it is okay. This includes tampons, creams, and douches.  Keep all follow-up visits as told by your health care provider. Ask your health care provider, or the department that is doing the procedure, when your results will be ready. This information is not intended to replace advice given to you by your health care provider. Make sure you discuss any questions you have with your health care provider. Document Revised: 02/10/2018 Document Reviewed: 02/10/2018 Elsevier Patient Education  [place].

## 2019-05-29 ENCOUNTER — Telehealth: Payer: Self-pay | Admitting: Obstetrics and Gynecology

## 2019-05-29 ENCOUNTER — Encounter: Payer: Self-pay | Admitting: Obstetrics and Gynecology

## 2019-05-29 NOTE — Telephone Encounter (Signed)
Call placed to [facility], spoke with [place]. Was advised 05/23/19 Colpo report not completed, will update once completed and reviewed by pathologist. Final report will come form [facility].    Call placed to patient, advised pathology report not completed. Our office will notify of final results once completed and reviewed by provider. Patient verbalizes understanding and is agreeable.   Routing to provider for final review. Patient is agreeable to disposition. Will close encounter.

## 2019-05-29 NOTE — Telephone Encounter (Signed)
Non-Urgent Medical Question Received: Today Message Contents  Rachael Hodge, Rachael Hodge sent to [facility]  Phone Number: [phone]  I see a report that has been posted to [facility]. What are next steps?

## 2019-06-01 ENCOUNTER — Telehealth: Payer: Self-pay | Admitting: *Deleted

## 2019-06-01 ENCOUNTER — Encounter: Payer: Self-pay | Admitting: Obstetrics and Gynecology

## 2019-06-01 LAB — SURGICAL PATHOLOGY

## 2019-06-01 NOTE — Telephone Encounter (Signed)
Call returned to patient, name identified on voicemail, left detailed message. Advised patient office phones go off at 4pm, advised of results as seen below per Dr. Talbert Nan. Advised patient to return call to office on 5/3 to further discuss. Office phones come back on at [place].

## 2019-06-01 NOTE — Telephone Encounter (Signed)
-----   Message from [name], MD sent at 06/01/2019  4:10 PM EDT ----- Please let the patient know that the [facility] returned with LSIL, the vaginal biopsy returned with VAIN 2. I think I removed the majority of the focal area of VAIN that I saw. We discussed a leep since she has had so many abnormal paps. The vaginal dysplasia can be excised/lasered or treated with vaginal medication. The preferred treatment is removal. Options are leep in the OR with treatment of any vaginal dysplasia. Leep or no leep in the office with vaginal medication for the dysplasia.

## 2019-06-01 NOTE — Telephone Encounter (Signed)
Burnice Logan, RN  06/01/2019 4:24 PM EDT    Call to patients mobile number, no answer. Left message advising patient our office phones go off at 4pm, I will try her again in 5-10 minutes.

## 2019-06-04 ENCOUNTER — Encounter: Payer: Self-pay | Admitting: Obstetrics and Gynecology

## 2019-06-04 ENCOUNTER — Telehealth (INDEPENDENT_AMBULATORY_CARE_PROVIDER_SITE_OTHER): Payer: 59 | Admitting: Obstetrics and Gynecology

## 2019-06-04 DIAGNOSIS — N893 Dysplasia of vagina, unspecified: Secondary | ICD-10-CM | POA: Diagnosis not present

## 2019-06-04 DIAGNOSIS — N879 Dysplasia of cervix uteri, unspecified: Secondary | ICD-10-CM | POA: Diagnosis not present

## 2019-06-04 NOTE — Progress Notes (Signed)
Virtual Visit via Video Note  I connected with Rachael Hodge on 06/04/19 at 11:30 AM EDT by a video enabled telemedicine application and verified that I am speaking with the correct person using two identifiers.  Location: Patient: Home Provider: Office at [facility]   I discussed the limitations of evaluation and management by telemedicine and the availability of in person appointments. The patient expressed understanding and agreed to proceed.   GYNECOLOGY  VISIT   HPI: 54 y.o.   Married White or Caucasian Not Hispanic or Latino  female   [phone] with Patient's last menstrual period was 12/02/2012.   here for a virtual visit to discuss management of VAIN II and persistent CIN I  GYNECOLOGIC HISTORY: Patient's last menstrual period was 12/02/2012. Contraception: Post-menopausal  Menopausal hormone therapy: none        OB History    Gravida  2   Para  2   Term  2   Preterm      AB      Living  2     SAB      TAB      Ectopic      Multiple      Live Births  2              Patient Active Problem List   Diagnosis Date Noted  . Diabetes mellitus type II, controlled ([place]) 10/16/2018  . Gastritis 07/03/2018  . Low serum vitamin B12 07/03/2018  . Heme positive stool   . Iron deficiency anemia 10/08/2016  . Hyperlipemia 09/29/2015  . Essential hypertension 09/29/2015  . Major depressive disorder with single episode, in full remission ([place]) 09/29/2015    Past Medical History:  Diagnosis Date  . Abnormal Pap smear of cervix    020 LGSIL HPV HR+, hx of LEEP  . Anemia   . Depression   . Eczema   . GERD (gastroesophageal reflux disease)   . Hyperlipidemia   . Hypertension   . Urticaria     Past Surgical History:  Procedure Laterality Date  . COLPOSCOPY  2006  . GIVENS CAPSULE STUDY N/A 05/09/2017   Procedure: GIVENS CAPSULE STUDY;  Surgeon: Mauri Pole, MD;  Location: [facility];  Service: Endoscopy;  Laterality:  N/A;    Current Outpatient Medications  Medication Sig Dispense Refill  . blood glucose meter kit and supplies KIT Dispense based on patient and insurance preference. Use up to four times daily as directed. (FOR ICD-9 250.00, 250.01). 1 each 0  . ferrous sulfate 325 (65 FE) MG tablet Take 1 tablet (325 mg total) by mouth daily with breakfast.  3  . FLUoxetine (PROZAC) 40 MG capsule TAKE 1 CAPSULE (40 MG TOTAL) BY MOUTH DAILY. 90 capsule 1  . losartan (COZAAR) 50 MG tablet Take 1 tablet (50 mg total) by mouth daily. 90 tablet 3  . metFORMIN (GLUCOPHAGE-XR) 500 MG 24 hr tablet TAKE 1 TABLET BY MOUTH ONCE DAILY WITH BREAKFAST 90 tablet 1  . omeprazole (PRILOSEC) 40 MG capsule TAKE 1 CAPSULE BY MOUTH TWICE DAILY BEFORE A MEAL 180 capsule 3  . pravastatin (PRAVACHOL) 40 MG tablet Take 1 tablet (40 mg total) by mouth daily. 90 tablet 3  . vitamin B-12 100 MCG tablet Take 1 tablet (100 mcg total) by mouth daily.     No current facility-administered medications for this visit.     ALLERGIES: Penicillins, Sulfa antibiotics, Feraheme [ferumoxytol], and Lisinopril  Family History  Problem Relation Age  of Onset  . Early death Mother 15       died in sleep; uncertain cause  . Hyperlipidemia Father   . Hypertension Father   . Stroke Father 59  . CAD Father 63       CAD, quadruple bypass  . Heart disease Paternal Grandfather 60  . Crohn's disease Son   . Heart attack Maternal Grandmother   . Alzheimer's disease Maternal Grandmother   . Heart attack Maternal Grandfather   . Heart attack Paternal Grandmother   . Diabetes Paternal Grandmother   . Breast cancer Neg Hx   . Colon cancer Neg Hx     Social History   Socioeconomic History  . Marital status: Married    Spouse name: Not on file  . Number of children: Not on file  . Years of education: Not on file  . Highest education level: Not on file  Occupational History  . Not on file  Tobacco Use  . Smoking status: Never Smoker  .  Smokeless tobacco: Never Used  Substance and Sexual Activity  . Alcohol use: Yes    Alcohol/week: 2.0 standard drinks    Types: 2 Cans of beer per week  . Drug use: No  . Sexual activity: Yes    Partners: Male    Birth control/protection: Post-menopausal  Other Topics Concern  . 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 Difficulty of Paying Living Expenses:   Food Insecurity:   . Worried About Charity fundraiser in the Last Year:   . Arboriculturist in the Last Year:   Transportation Needs:   . Film/video editor (Medical):   [name] Lack of Transportation (Non-Medical):   Physical Activity:   . Days of Exercise per Week:   . Minutes of Exercise per Session:   Stress:   . Feeling of Stress :   Social Connections:   . Frequency of Communication with Friends and Family:   . Frequency of Social Gatherings with Friends and Family:   . Attends Religious Services:   . Active Member of Clubs or Organizations:   . Attends Archivist Meetings:   [name] Marital Status:   Intimate Partner Violence:   . Fear of Current or Ex-Partner:   . Emotionally Abused:   [name] Physically Abused:   . Sexually Abused:     Review of Systems  Constitutional: Negative.   HENT: Negative.   Eyes: Negative.   Respiratory: Negative.   Cardiovascular: Negative.   Gastrointestinal: Negative.   Genitourinary: Negative.   Musculoskeletal: Negative.   Skin: Negative.   Neurological: Negative.   Endo/Heme/Allergies: Negative.   Psychiatric/Behavioral: Negative.     PHYSICAL EXAMINATION:    LMP 12/02/2012     General appearance: alert, cooperative and appears stated age  ASSESSMENT Persistent CIN I, discussed the option of continued  surveillance or LEEP VAIN II, at the time of colposcopy, I felt I removed most of the abnormal area. Discussed that treatment is needed, standard of care is removal or laser, 5FU is another option particularly for multifocal disease. Her disease seemed focal.     PLAN  After discussion, will proceed with colposcopy in the OR with LEEP and removal and possible laser of VAIN. Discussed risks of recurrent dysplasia (common with vaginal dysplasia), discussed the risk of vaginal scarring and dyspareunia Return for pre-op   ~15 minutes in total patient care.

## 2019-06-04 NOTE — Telephone Encounter (Signed)
Visit Follow-Up Question Received: 3 days ago Message Contents  Rachael Hodge, Rachael Hodge sent to [facility]  Phone Number: [phone]  I wish that I had not missed to call about this. Unable to call back with my concerns. Phones off.  Dr. Talbert Nan I would like your opinion on which option I should go with.

## 2019-06-04 NOTE — Telephone Encounter (Signed)
Spoke with patient. Patient has additional questions before making decision about LEEP procedure. MyChart consult scheduled for today at 11:30am with Dr. Talbert Nan.   Routing to provider for final review. Patient is agreeable to disposition. Will close encounter.

## 2019-06-11 ENCOUNTER — Telehealth: Payer: Self-pay

## 2019-06-11 ENCOUNTER — Encounter: Payer: Self-pay | Admitting: Obstetrics and Gynecology

## 2019-06-11 NOTE — Telephone Encounter (Signed)
Spoke with patient regarding surgery benefits. Patient acknowledges understanding of information presented. Patient is aware that benefits presented are for professional benefits only. Patient is aware that once surgery is scheduled, the hospital will call with separate benefits. Patient is aware of surgery cancellation policy.  Patient is ready to proceed with scheduling.

## 2019-06-11 NOTE — Telephone Encounter (Signed)
Pt sent following Mychart message:    Joleena, Weisenburger, MD 27 minutes ago (9:15 AM)   I have not heard anything from the office regarding scheduling my procedure(s).  Was I supposed to call or are we waiting for something?

## 2019-06-12 NOTE — Telephone Encounter (Signed)
Spoke with patient. Patient would like to proceed with surgery on 08/27/2019. Patient is unable to take any earlier surgery dates due to her personal schedule with vacation, husbands work schedule, and Dr.Jertson's work schedule. Pre op scheduled for 08/15/2019 at 11:30 pm with Dr.Jertson. COVID test scheduled for 08/23/2019 at 3:05 pm at [facility] location. Patient is aware of the need to quarantine after test until surgery. 4 week post op scheduled for 09/24/2019 at 1:30 pm with Dr.Jertson. Patient is agreeable to all dates and times. Surgery instructions reviewed and given to [facility] to give patient at her pre op appointment. Patient verbalizes understanding.   Routing to provider and will close encounter.

## 2019-06-26 ENCOUNTER — Other Ambulatory Visit: Payer: Self-pay

## 2019-06-27 ENCOUNTER — Ambulatory Visit: Payer: 59 | Admitting: Family Medicine

## 2019-06-27 ENCOUNTER — Encounter: Payer: Self-pay | Admitting: Family Medicine

## 2019-06-27 VITALS — BP 128/80 | HR 71 | Temp 97.3°F | Ht 62.0 in | Wt 159.5 lb

## 2019-06-27 DIAGNOSIS — D500 Iron deficiency anemia secondary to blood loss (chronic): Secondary | ICD-10-CM

## 2019-06-27 DIAGNOSIS — E785 Hyperlipidemia, unspecified: Secondary | ICD-10-CM

## 2019-06-27 DIAGNOSIS — E875 Hyperkalemia: Secondary | ICD-10-CM | POA: Diagnosis not present

## 2019-06-27 DIAGNOSIS — E538 Deficiency of other specified B group vitamins: Secondary | ICD-10-CM

## 2019-06-27 DIAGNOSIS — I10 Essential (primary) hypertension: Secondary | ICD-10-CM

## 2019-06-27 DIAGNOSIS — E119 Type 2 diabetes mellitus without complications: Secondary | ICD-10-CM | POA: Diagnosis not present

## 2019-06-27 LAB — CBC WITH DIFFERENTIAL/PLATELET
Basophils Absolute: 0 10*3/uL (ref 0.0–0.1)
Basophils Relative: 0.8 % (ref 0.0–3.0)
Eosinophils Absolute: 0.2 10*3/uL (ref 0.0–0.7)
Eosinophils Relative: 3.5 % (ref 0.0–5.0)
HCT: 40.2 % (ref 36.0–46.0)
Hemoglobin: 13.5 g/dL (ref 12.0–15.0)
Lymphocytes Relative: 15.3 % (ref 12.0–46.0)
Lymphs Abs: 0.7 10*3/uL (ref 0.7–4.0)
MCHC: 33.5 g/dL (ref 30.0–36.0)
MCV: 85.3 fl (ref 78.0–100.0)
Monocytes Absolute: 0.5 10*3/uL (ref 0.1–1.0)
Monocytes Relative: 10.5 % (ref 3.0–12.0)
Neutro Abs: 3.1 10*3/uL (ref 1.4–7.7)
Neutrophils Relative %: 69.9 % (ref 43.0–77.0)
Platelets: 260 10*3/uL (ref 150.0–400.0)
RBC: 4.71 Mil/uL (ref 3.87–5.11)
RDW: 14.6 % (ref 11.5–15.5)
WBC: 4.4 10*3/uL (ref 4.0–10.5)

## 2019-06-27 LAB — LIPID PANEL
Cholesterol: 227 mg/dL — ABNORMAL HIGH (ref 0–200)
HDL: 68.2 mg/dL (ref >39.00)
LDL Cholesterol: 127 mg/dL — ABNORMAL HIGH (ref 0–99)
NonHDL: 158.69
Total CHOL/HDL Ratio: 3
Triglycerides: 157 mg/dL — ABNORMAL HIGH (ref 0.0–149.0)
VLDL: 31.4 mg/dL (ref 0.0–40.0)

## 2019-06-27 LAB — IBC + FERRITIN
Ferritin: 27.6 ng/mL (ref 10.0–291.0)
Iron: 108 ug/dL (ref 42–145)
Saturation Ratios: 23.7 % (ref 20.0–50.0)
Transferrin: 326 mg/dL (ref 212.0–360.0)

## 2019-06-27 LAB — COMPREHENSIVE METABOLIC PANEL
ALT: 41 U/L — ABNORMAL HIGH (ref 0–35)
AST: 29 U/L (ref 0–37)
Albumin: 5 g/dL (ref 3.5–5.2)
Alkaline Phosphatase: 114 U/L (ref 39–117)
BUN: 15 mg/dL (ref 6–23)
CO2: 28 mEq/L (ref 19–32)
Calcium: 10 mg/dL (ref 8.4–10.5)
Chloride: 100 mEq/L (ref 96–112)
Creatinine, Ser: 0.96 mg/dL (ref 0.40–1.20)
GFR: 60.03 mL/min (ref >60.00)
Glucose, Bld: 91 mg/dL (ref 70–99)
Potassium: 5.4 mEq/L — ABNORMAL HIGH (ref 3.5–5.1)
Sodium: 137 mEq/L (ref 135–145)
Total Bilirubin: 0.4 mg/dL (ref 0.2–1.2)
Total Protein: 7.4 g/dL (ref 6.0–8.3)

## 2019-06-27 LAB — MICROALBUMIN / CREATININE URINE RATIO
Creatinine,U: 60.4 mg/dL
Microalb Creat Ratio: 1.2 mg/g (ref 0.0–30.0)
Microalb, Ur: <0.7 mg/dL (ref 0.0–1.9)

## 2019-06-27 LAB — VITAMIN B12: Vitamin B-12: 362 pg/mL (ref 211–911)

## 2019-06-27 LAB — HEMOGLOBIN A1C: Hgb A1c MFr Bld: 6.4 % (ref 4.6–6.5)

## 2019-06-27 NOTE — Progress Notes (Signed)
Rachael Hodge DOB: 05-14-1965 Encounter date: 06/27/2019  This is a 54 y.o. female who presents with Chief Complaint  Patient presents with  . Follow-up    History of present illness: Last visit with me was October/2020.  At that time she was adjusting to new her diagnosis of diabetes.  She was working on cutting out sugar and healthier lifestyle.  Last A1c was in January and was 6.5. "I think the metformin is kind of messing with me". Took awhile to figure this out. First thing that goes into her system she runs to bathroom and feels sick when she eats/drinks. Has to have full stomach when she takes the metformin. When she goes to the beach she goes out to breakfast and doesn't take meds and realized that her belly was so much better without medication. Has done better with diet lately. Doing more prepping for meals; healthier eating overall. Husband has a fib. Doing a diabetic class in a couple of weeks.   She has been following with Dr. Talbert Nan for abnormal Pap smears and plan is to do a LEEP procedure. Having this procedure in July.   Has to have wisdom tooth taken out - has cyst grown around tooth which is impacted. Cyst is eroding jaw line.   Happy it is summer, has new puppy.   Allergies  Allergen Reactions  . Penicillins Shortness Of Breath  . Sulfa Antibiotics Shortness Of Breath  . Feraheme [Ferumoxytol] Nausea Only and Other (See Comments)    Hypotension and low heart rate  . Lisinopril     cough   Current Meds  Medication Sig  . blood glucose meter kit and supplies KIT Dispense based on patient and insurance preference. Use up to four times daily as directed. (FOR ICD-9 250.00, 250.01).  . ferrous sulfate 325 (65 FE) MG tablet Take 1 tablet (325 mg total) by mouth daily with breakfast.  . FLUoxetine (PROZAC) 40 MG capsule TAKE 1 CAPSULE (40 MG TOTAL) BY MOUTH DAILY.  [name] losartan (COZAAR) 50 MG tablet Take 1 tablet (50 mg total) by mouth daily.  [name] omeprazole  (PRILOSEC) 40 MG capsule TAKE 1 CAPSULE BY MOUTH TWICE DAILY BEFORE A MEAL  . pravastatin (PRAVACHOL) 40 MG tablet Take 1 tablet (40 mg total) by mouth daily.  . vitamin B-12 100 MCG tablet Take 1 tablet (100 mcg total) by mouth daily.  . [DISCONTINUED] metFORMIN (GLUCOPHAGE-XR) 500 MG 24 hr tablet TAKE 1 TABLET BY MOUTH ONCE DAILY WITH BREAKFAST    Review of Systems  Constitutional: Negative for chills, fatigue and fever.  Respiratory: Negative for cough, chest tightness, shortness of breath and wheezing.   Cardiovascular: Negative for chest pain, palpitations and leg swelling.    Objective:  BP 128/80 (BP Location: Left Arm, Patient Position: Sitting, Cuff Size: Normal)   Pulse 71   Temp (!) 97.3 F (36.3 C) (Temporal)   Ht 5' 2"  (1.575 m)   Wt 159 lb 8 oz (72.3 kg)   LMP 12/02/2012   BMI 29.17 kg/m   Weight: 159 lb 8 oz (72.3 kg)   BP Readings from Last 3 Encounters:  06/27/19 128/80  05/23/19 130/60  04/26/19 126/68   Wt Readings from Last 3 Encounters:  06/27/19 159 lb 8 oz (72.3 kg)  05/23/19 164 lb (74.4 kg)  04/26/19 158 lb (71.7 kg)    Physical Exam Constitutional:      General: She is not in acute distress.    Appearance: She is well-developed.  HENT:     Head: Normocephalic and atraumatic.  Cardiovascular:     Rate and Rhythm: Normal rate and regular rhythm.     Heart sounds: Normal heart sounds. No murmur.  Pulmonary:     Effort: Pulmonary effort is normal.     Breath sounds: Normal breath sounds.  Abdominal:     General: Bowel sounds are normal. There is no distension.     Palpations: Abdomen is soft.     Tenderness: There is no abdominal tenderness. There is no guarding.  Skin:    General: Skin is warm and dry.     Comments: Sensory exam of the foot is normal, tested with the monofilament. Good pulses, no lesions or ulcers, good peripheral pulses.  Psychiatric:        Judgment: Judgment normal.     Assessment/Plan  1. Essential  hypertension We discussed blood pressure goals today.  Would like to see her numbers a little bit lower, but she is working on weight loss and she will continue to monitor.  Continue losartan 50 mg. - CBC with Differential/Platelet; Future - Comprehensive metabolic panel; Future  2. Controlled type 2 diabetes mellitus without complication, without long-term current use of insulin ([place]) Follow-up pending blood work today.  She has worked on [employer] as well as regular exercise. - Hemoglobin A1c; Future - Microalbumin / creatinine urine ratio; Future  3. Hyperlipidemia, unspecified hyperlipidemia type Continue with pravastatin.  We will recheck blood work today. - Lipid panel; Future  4. Iron deficiency anemia due to chronic blood loss - IBC + Ferritin; Future  5. Low serum vitamin B12 - Vitamin B12; Future    Return for pending bloodwork results.     Micheline Rough, MD

## 2019-06-29 NOTE — Addendum Note (Signed)
Addended by: Agnes Lawrence on: 06/29/2019 02:30 PM   Modules accepted: Orders

## 2019-07-06 ENCOUNTER — Other Ambulatory Visit: Payer: Self-pay | Admitting: Gastroenterology

## 2019-07-06 ENCOUNTER — Other Ambulatory Visit: Payer: Self-pay

## 2019-07-06 MED ORDER — OMEPRAZOLE 40 MG PO CPDR: DELAYED_RELEASE_CAPSULE | ORAL | 0 refills | Status: DC

## 2019-07-06 MED FILL — OMEPRAZOLE 40 MG CPDR: 40 | 90 days supply | Qty: 180 | Fill #0

## 2019-07-06 MED FILL — LOSARTAN POTASSIUM 50 MG TA: 50 | 90 days supply | Qty: 90 | Fill #3

## 2019-07-06 MED FILL — PRAVASTATIN NA 40 MG TAB: 40 | 90 days supply | Qty: 90 | Fill #3

## 2019-07-06 NOTE — Telephone Encounter (Signed)
Omeprazole refilled as pharmacy requested.

## 2019-08-14 ENCOUNTER — Ambulatory Visit (INDEPENDENT_AMBULATORY_CARE_PROVIDER_SITE_OTHER): Payer: 59 | Admitting: Obstetrics and Gynecology

## 2019-08-14 ENCOUNTER — Encounter: Payer: Self-pay | Admitting: Obstetrics and Gynecology

## 2019-08-14 ENCOUNTER — Other Ambulatory Visit: Payer: Self-pay

## 2019-08-14 VITALS — BP 132/68 | HR 72 | Ht 62.0 in | Wt 165.0 lb

## 2019-08-14 DIAGNOSIS — N893 Dysplasia of vagina, unspecified: Secondary | ICD-10-CM

## 2019-08-14 DIAGNOSIS — B977 Papillomavirus as the cause of diseases classified elsewhere: Secondary | ICD-10-CM

## 2019-08-14 DIAGNOSIS — N879 Dysplasia of cervix uteri, unspecified: Secondary | ICD-10-CM

## 2019-08-14 NOTE — H&P (View-Only) (Signed)
GYNECOLOGY  VISIT   HPI: 54 y.o.   Married White or Caucasian Not Hispanic or Latino  female   [phone] with Patient's last menstrual period was 12/02/2012.   here for pre opt surgery visit.  The patient has VAIN II and persistent CIN I.  At the time of colposcopy I felt that I removed most of the vaginal abnormality.    H/O DM, last HgbA1C was 6.4 in 5/21. Just stopped metformin (chronic nausea and diarrhea). She is working on weight loss.   GYNECOLOGIC HISTORY: Patient's last menstrual period was 12/02/2012. Contraception:PMP Menopausal hormone therapy: none        OB History    Gravida  2   Para  2   Term  2   Preterm      AB      Living  2     SAB      TAB      Ectopic      Multiple      Live Births  2              Patient Active Problem List   Diagnosis Date Noted  . Diabetes mellitus type II, controlled ([place]) 10/16/2018  . Gastritis 07/03/2018  . Low serum vitamin B12 07/03/2018  . Heme positive stool   . Iron deficiency anemia 10/08/2016  . Hyperlipemia 09/29/2015  . Essential hypertension 09/29/2015  . Major depressive disorder with single episode, in full remission ([place]) 09/29/2015    Past Medical History:  Diagnosis Date  . Abnormal Pap smear of cervix    020 LGSIL HPV HR+, hx of LEEP  . Anemia   . Depression   . Eczema   . GERD (gastroesophageal reflux disease)   . Hyperlipidemia   . Hypertension   . Urticaria     Past Surgical History:  Procedure Laterality Date  . COLPOSCOPY  2006  . GIVENS CAPSULE STUDY N/A 05/09/2017   Procedure: GIVENS CAPSULE STUDY;  Surgeon: Mauri Pole, MD;  Location: [facility];  Service: Endoscopy;  Laterality: N/A;    Current Outpatient Medications  Medication Sig Dispense Refill  . blood glucose meter kit and supplies KIT Dispense based on patient and insurance preference. Use up to four times daily as directed. (FOR ICD-9 250.00, 250.01). 1 each 0  . ferrous sulfate 325 (65 FE) MG tablet  Take 1 tablet (325 mg total) by mouth daily with breakfast.  3  . FLUoxetine (PROZAC) 40 MG capsule TAKE 1 CAPSULE (40 MG TOTAL) BY MOUTH DAILY. 90 capsule 1  . losartan (COZAAR) 50 MG tablet Take 1 tablet (50 mg total) by mouth daily. 90 tablet 3  . omeprazole (PRILOSEC) 40 MG capsule TAKE 1 CAPSULE BY MOUTH TWICE DAILY BEFORE A MEAL 180 capsule 0  . pravastatin (PRAVACHOL) 40 MG tablet Take 1 tablet (40 mg total) by mouth daily. 90 tablet 3  . vitamin B-12 100 MCG tablet Take 1 tablet (100 mcg total) by mouth daily.     No current facility-administered medications for this visit.     ALLERGIES: Penicillins, Sulfa antibiotics, Feraheme [ferumoxytol], and Lisinopril  Family History  Problem Relation Age of Onset  . Early death Mother 51       died in sleep; uncertain cause  . Hyperlipidemia Father   . Hypertension Father   . Stroke Father 85  . CAD Father 6       CAD, quadruple bypass  . Heart disease Paternal Grandfather 13  . Crohn's  disease Son   . Heart attack Maternal Grandmother   . Alzheimer's disease Maternal Grandmother   . Heart attack Maternal Grandfather   . Heart attack Paternal Grandmother   . Diabetes Paternal Grandmother   . Breast cancer Neg Hx   . Colon cancer Neg Hx     Social History   Socioeconomic History  . Marital status: Married    Spouse name: Not on file  . Number of children: Not on file  . Years of education: Not on file  . Highest education level: Not on file  Occupational History  . Not on file  Tobacco Use  . Smoking status: Never Smoker  . Smokeless tobacco: Never Used  Vaping Use  . Vaping Use: Never used  Substance and Sexual Activity  . Alcohol use: Yes    Alcohol/week: 2.0 standard drinks    Types: 2 Cans of beer per week  . Drug use: No  . Sexual activity: Yes    Partners: Male    Birth control/protection: Post-menopausal  Other Topics Concern  . Not on file  Social History Narrative   Work or School: Programmer, multimedia Situation: lives with boyfriend      Spiritual Beliefs: Christian      Lifestyle: exercising, trying to eat well            Social Determinants of Health   Financial Resource Strain:   . Difficulty of Paying Living Expenses:   Food Insecurity:   . Worried About Charity fundraiser in the Last Year:   . Arboriculturist in the Last Year:   Transportation Needs:   . Film/video editor (Medical):   [name] Lack of Transportation (Non-Medical):   Physical Activity:   . Days of Exercise per Week:   . Minutes of Exercise per Session:   Stress:   . Feeling of Stress :   Social Connections:   . Frequency of Communication with Friends and Family:   . Frequency of Social Gatherings with Friends and Family:   . Attends Religious Services:   . Active Member of Clubs or Organizations:   . Attends Archivist Meetings:   [name] Marital Status:   Intimate Partner Violence:   . Fear of Current or Ex-Partner:   . Emotionally Abused:   [name] Physically Abused:   . Sexually Abused:     Review of Systems  All other systems reviewed and are negative.   PHYSICAL EXAMINATION:    BP 132/68   Pulse 72   Ht 5' 2" (1.575 m)   Wt 165 lb (74.8 kg)   LMP 12/02/2012   SpO2 100%   BMI 30.18 kg/m     General appearance: alert, cooperative and appears stated age Neck: no adenopathy, supple, symmetrical, trachea midline and thyroid normal to inspection and palpation Heart: regular rate and rhythm Lungs: CTAB Abdomen: soft, non-tender; bowel sounds normal; no masses,  no organomegaly Extremities: normal, atraumatic, no cyanosis Skin: normal color, texture and turgor, no rashes or lesions Lymph: normal cervical supraclavicular and inguinal nodes Neurologic: grossly normal   ASSESSMENT Persistent CIN I, discussed option of continued surveillance or LEEP.  VAIN II, think most of the dysplasia was removed at time of the colposcopy.    PLAN Colposcopy in  the OR with LEEP and removal/possible laser of any residual VAIN.  Discussed risk of recurrent dysplasia, risk of scarring and dyspareunia.

## 2019-08-14 NOTE — Patient Instructions (Signed)
Loop Electrosurgical Excision Procedure Loop electrosurgical excision procedure (LEEP) is the cutting and removal (excision) of tissue from the cervix. The cervix is the bottom part of the uterus that opens into the vagina. The tissue that is removed from the cervix is examined to see if there are precancerous cells or cancer cells present. LEEP may be done when:  You have abnormal bleeding from your cervix.  You have an abnormal Pap test result.  Your health care provider finds an abnormality on your cervix during a pelvic exam. LEEP typically only takes a few minutes and is often done in the health care provider's office. The procedure is safe for women who are trying to get pregnant. However, the procedure is usually not done during a menstrual period or during pregnancy. Tell a health care provider about:  Any allergies you have.  All medicines you are taking, including vitamins, herbs, eye drops, creams, and over-the-counter medicines.  Any blood disorders you have.  Any medical conditions you have, including current or past vaginal infections such as herpes or sexually-transmitted infections (STIs).  Whether you are pregnant or may be pregnant.  Whether or not you are having vaginal bleeding on the day of the procedure. What are the risks? Generally, this is a safe procedure. However, problems may occur, including:  Infection.  Bleeding.  Allergic reactions to medicines.  Changes or scarring in the cervix.  Increased risk of early (preterm) labor in future pregnancies. What happens before the procedure?  Ask your health care provider about: ? Changing or stopping your regular medicines. This is especially important if you are taking diabetes medicines or blood thinners. ? Taking medicines such as aspirin and ibuprofen. These medicines can thin your blood. Do not take these medicines unless your health care provider tells you to take them. ? Taking over-the-counter  medicines, vitamins, herbs, and supplements.  Your health care provider may recommend that you take pain medicine before the procedure.  Ask your health care provider if you should plan to have someone take you home after the procedure. What happens during the procedure?   An instrument called a speculum will be placed in your vagina. This will allow your health care provider to see your cervix.  You will be given a medicine to numb the area (local anesthetic). The medicine will be injected into your cervix and the surrounding area.  A solution will be applied to your cervix. This solution will help the health care provider find the abnormal cells that need to be removed.  A thin wire loop will be passed through your vagina. The wire will be used to burn (cauterize) the cervical tissue with an electrical current.  You may feel faint during the procedure. Tell your health care provider right away if you feel this way.  The abnormal cervical tissue will be removed.  Any open blood vessels will be cauterized to prevent bleeding.  A paste may be applied to the cauterized area of your cervix to help prevent bleeding.  The sample of cervical tissue will be examined under a microscope. The procedure may vary among health care providers and hospitals. What can I expect after the procedure? After the procedure, it is common to have:  Mild abdominal cramps that are similar to menstrual cramps. These may last for up to 1 week.  A small amount of pink-tinged or bloody vaginal discharge, including light to moderate bleeding, for 1-2 weeks.  A dark-colored discharge coming from your vagina. This is from  the paste that was used on the cervix to prevent bleeding. It is up to you to get the results of your procedure. Ask your health care provider, or the department that is doing the procedure, when your results will be ready. Follow these instructions at home:  Take over-the-counter and  prescription medicines only as told by your health care provider.  Return to your normal activities as told by your health care provider. Ask your health care provider what activities are safe for you.  Do not put anything in your vagina for 2 weeks after the procedure or until your health care provider says that it is okay. This includes tampons, creams, and douches.  Do not have sex until your health care provider approves.  Keep all follow-up visits as told by your health care provider. This is important. Contact a health care provider if you:  Have a fever or chills.  Feel unusually weak.  Have vaginal bleeding that is heavier or longer than a normal menstrual cycle. A sign of this can be soaking a pad with blood or bleeding with clots.  Develop a bad smelling vaginal discharge.  Have severe abdominal pain or cramping. Summary  Loop electrosurgical excision procedure (LEEP) is the removal of tissue from the cervix. The removed tissue will be checked for precancerous cells or cancer cells.  LEEP typically only takes a few minutes and is often done in the health care provider's office.  Do not put anything in your vagina for 2 weeks after the procedure or until your health care provider says that it is okay. This includes tampons, creams, and douches.  Keep all follow-up visits as told by your health care provider. Ask your health care provider, or the department that is doing the procedure, when your results will be ready. This information is not intended to replace advice given to you by your health care provider. Make sure you discuss any questions you have with your health care provider. Document Revised: 02/10/2018 Document Reviewed: 02/10/2018 Elsevier Patient Education  [place].

## 2019-08-14 NOTE — Progress Notes (Signed)
GYNECOLOGY  VISIT   HPI: 54 y.o.   Married White or Caucasian Not Hispanic or Latino  female   G2P2002 with Patient's last menstrual period was 12/02/2012.   here for pre opt surgery visit.  The patient has VAIN II and persistent CIN I.  At the time of colposcopy I felt that I removed most of the vaginal abnormality.    H/O DM, last HgbA1C was 6.4 in 5/21. Just stopped metformin (chronic nausea and diarrhea). She is working on weight loss.   GYNECOLOGIC HISTORY: Patient's last menstrual period was 12/02/2012. Contraception:PMP Menopausal hormone therapy: none        OB History    Gravida  2   Para  2   Term  2   Preterm      AB      Living  2     SAB      TAB      Ectopic      Multiple      Live Births  2              Patient Active Problem List   Diagnosis Date Noted  . Diabetes mellitus type II, controlled (HCC) 10/16/2018  . Gastritis 07/03/2018  . Low serum vitamin B12 07/03/2018  . Heme positive stool   . Iron deficiency anemia 10/08/2016  . Hyperlipemia 09/29/2015  . Essential hypertension 09/29/2015  . Major depressive disorder with single episode, in full remission (HCC) 09/29/2015    Past Medical History:  Diagnosis Date  . Abnormal Pap smear of cervix    020 LGSIL HPV HR+, hx of LEEP  . Anemia   . Depression   . Eczema   . GERD (gastroesophageal reflux disease)   . Hyperlipidemia   . Hypertension   . Urticaria     Past Surgical History:  Procedure Laterality Date  . COLPOSCOPY  2006  . GIVENS CAPSULE STUDY N/A 05/09/2017   Procedure: GIVENS CAPSULE STUDY;  Surgeon: Nandigam, Kavitha V, MD;  Location: [facility];  Service: Endoscopy;  Laterality: N/A;    Current Outpatient Medications  Medication Sig Dispense Refill  . blood glucose meter kit and supplies KIT Dispense based on patient and insurance preference. Use up to four times daily as directed. (FOR ICD-9 250.00, 250.01). 1 each 0  . ferrous sulfate 325 (65 FE) MG tablet  Take 1 tablet (325 mg total) by mouth daily with breakfast.  3  . FLUoxetine (PROZAC) 40 MG capsule TAKE 1 CAPSULE (40 MG TOTAL) BY MOUTH DAILY. 90 capsule 1  . losartan (COZAAR) 50 MG tablet Take 1 tablet (50 mg total) by mouth daily. 90 tablet 3  . omeprazole (PRILOSEC) 40 MG capsule TAKE 1 CAPSULE BY MOUTH TWICE DAILY BEFORE A MEAL 180 capsule 0  . pravastatin (PRAVACHOL) 40 MG tablet Take 1 tablet (40 mg total) by mouth daily. 90 tablet 3  . vitamin B-12 100 MCG tablet Take 1 tablet (100 mcg total) by mouth daily.     No current facility-administered medications for this visit.     ALLERGIES: Penicillins, Sulfa antibiotics, Feraheme [ferumoxytol], and Lisinopril  Family History  Problem Relation Age of Onset  . Early death Mother 69       died in sleep; uncertain cause  . Hyperlipidemia Father   . Hypertension Father   . Stroke Father 77  . CAD Father 45       CAD, quadruple bypass  . Heart disease Paternal Grandfather 40  . Crohn's   disease Son   . Heart attack Maternal Grandmother   . Alzheimer's disease Maternal Grandmother   . Heart attack Maternal Grandfather   . Heart attack Paternal Grandmother   . Diabetes Paternal Grandmother   . Breast cancer Neg Hx   . Colon cancer Neg Hx     Social History   Socioeconomic History  . Marital status: Married    Spouse name: Not on file  . Number of children: Not on file  . Years of education: Not on file  . Highest education level: Not on file  Occupational History  . Not on file  Tobacco Use  . Smoking status: Never Smoker  . Smokeless tobacco: Never Used  Vaping Use  . Vaping Use: Never used  Substance and Sexual Activity  . Alcohol use: Yes    Alcohol/week: 2.0 standard drinks    Types: 2 Cans of beer per week  . Drug use: No  . Sexual activity: Yes    Partners: Male    Birth control/protection: Post-menopausal  Other Topics Concern  . Not on file  Social History Narrative   Work or School: senior executive  administrative assistant       Home Situation: lives with boyfriend      Spiritual Beliefs: Christian      Lifestyle: exercising, trying to eat well            Social Determinants of Health   Financial Resource Strain:   . Difficulty of Paying Living Expenses:   Food Insecurity:   . Worried About Running Out of Food in the Last Year:   . Ran Out of Food in the Last Year:   Transportation Needs:   . Lack of Transportation (Medical):   . Lack of Transportation (Non-Medical):   Physical Activity:   . Days of Exercise per Week:   . Minutes of Exercise per Session:   Stress:   . Feeling of Stress :   Social Connections:   . Frequency of Communication with Friends and Family:   . Frequency of Social Gatherings with Friends and Family:   . Attends Religious Services:   . Active Member of Clubs or Organizations:   . Attends Club or Organization Meetings:   . Marital Status:   Intimate Partner Violence:   . Fear of Current or Ex-Partner:   . Emotionally Abused:   . Physically Abused:   . Sexually Abused:     Review of Systems  All other systems reviewed and are negative.   PHYSICAL EXAMINATION:    BP 132/68   Pulse 72   Ht 5' 2" (1.575 m)   Wt 165 lb (74.8 kg)   LMP 12/02/2012   SpO2 100%   BMI 30.18 kg/m     General appearance: alert, cooperative and appears stated age Neck: no adenopathy, supple, symmetrical, trachea midline and thyroid normal to inspection and palpation Heart: regular rate and rhythm Lungs: CTAB Abdomen: soft, non-tender; bowel sounds normal; no masses,  no organomegaly Extremities: normal, atraumatic, no cyanosis Skin: normal color, texture and turgor, no rashes or lesions Lymph: normal cervical supraclavicular and inguinal nodes Neurologic: grossly normal   ASSESSMENT Persistent CIN I, discussed option of continued surveillance or LEEP.  VAIN II, think most of the dysplasia was removed at time of the colposcopy.    PLAN Colposcopy in  the OR with LEEP and removal/possible laser of any residual VAIN.  Discussed risk of recurrent dysplasia, risk of scarring and dyspareunia.      

## 2019-08-15 ENCOUNTER — Ambulatory Visit: Payer: 59 | Admitting: Obstetrics and Gynecology

## 2019-08-17 ENCOUNTER — Encounter (HOSPITAL_BASED_OUTPATIENT_CLINIC_OR_DEPARTMENT_OTHER): Payer: Self-pay | Admitting: Obstetrics and Gynecology

## 2019-08-17 NOTE — Progress Notes (Signed)
Spoke w/ via phone for pre-op interview--- PT Lab needs dos----  Istat and EKG (pre-op orders pending)              Lab results------ no COVID test ------ 08-23-2019 @ 1500 Arrive at ------- 0830 NPO after MN NO Solid Food.  Clear liquids from MN until--- 0730 then nothing by mouth Medications to take morning of surgery ----- Prozac, Pravastatin, Prilosec w/ sips of water Diabetic medication ----- n/a Patient Special Instructions ----- n/a Pre-Op special Istructions ----- sent inbox message to dr Talbert Nan requesting pre-op orders Patient verbalized understanding of instructions that were given at this phone interview. Patient denies shortness of breath, chest pain, fever, cough a this phone interview.

## 2019-08-20 ENCOUNTER — Other Ambulatory Visit: Payer: Self-pay | Admitting: Family Medicine

## 2019-08-21 ENCOUNTER — Other Ambulatory Visit: Payer: Self-pay | Admitting: Family Medicine

## 2019-08-21 MED FILL — FLUoxetine HCL 40 MG CAPS: 40 | 90 days supply | Qty: 90 | Fill #0

## 2019-08-23 ENCOUNTER — Other Ambulatory Visit (HOSPITAL_COMMUNITY)
Admission: RE | Admit: 2019-08-23 | Discharge: 2019-08-23 | Disposition: A | Discharge status: Home / Self Care | Payer: 59 | Source: Ambulatory Visit | Attending: Obstetrics and Gynecology | Admitting: Obstetrics and Gynecology

## 2019-08-23 DIAGNOSIS — Z20822 Contact with and (suspected) exposure to covid-19: Secondary | ICD-10-CM | POA: Diagnosis not present

## 2019-08-23 DIAGNOSIS — Z01812 Encounter for preprocedural laboratory examination: Secondary | ICD-10-CM | POA: Diagnosis not present

## 2019-08-23 LAB — SARS CORONAVIRUS 2 (TAT 6-24 HRS): SARS Coronavirus 2: NEGATIVE

## 2019-08-27 ENCOUNTER — Encounter (HOSPITAL_BASED_OUTPATIENT_CLINIC_OR_DEPARTMENT_OTHER): Payer: Self-pay | Admitting: Obstetrics and Gynecology

## 2019-08-27 ENCOUNTER — Encounter (HOSPITAL_BASED_OUTPATIENT_CLINIC_OR_DEPARTMENT_OTHER)
Admission: RE | Disposition: A | Discharge status: Home / Self Care | Payer: Self-pay | Source: Home / Self Care | Attending: Obstetrics and Gynecology

## 2019-08-27 ENCOUNTER — Ambulatory Visit (HOSPITAL_BASED_OUTPATIENT_CLINIC_OR_DEPARTMENT_OTHER)
Admission: RE | Admit: 2019-08-27 | Discharge: 2019-08-27 | Disposition: A | Discharge status: Home / Self Care | Payer: 59 | Attending: Obstetrics and Gynecology | Admitting: Obstetrics and Gynecology

## 2019-08-27 ENCOUNTER — Ambulatory Visit (HOSPITAL_BASED_OUTPATIENT_CLINIC_OR_DEPARTMENT_OTHER): Payer: 59 | Admitting: Anesthesiology

## 2019-08-27 ENCOUNTER — Other Ambulatory Visit: Payer: Self-pay

## 2019-08-27 DIAGNOSIS — N891 Moderate vaginal dysplasia: Secondary | ICD-10-CM | POA: Insufficient documentation

## 2019-08-27 DIAGNOSIS — E119 Type 2 diabetes mellitus without complications: Secondary | ICD-10-CM | POA: Diagnosis not present

## 2019-08-27 DIAGNOSIS — D061 Carcinoma in situ of exocervix: Secondary | ICD-10-CM | POA: Insufficient documentation

## 2019-08-27 DIAGNOSIS — K219 Gastro-esophageal reflux disease without esophagitis: Secondary | ICD-10-CM | POA: Insufficient documentation

## 2019-08-27 DIAGNOSIS — Z882 Allergy status to sulfonamides status: Secondary | ICD-10-CM | POA: Insufficient documentation

## 2019-08-27 DIAGNOSIS — N870 Mild cervical dysplasia: Secondary | ICD-10-CM | POA: Diagnosis not present

## 2019-08-27 DIAGNOSIS — N893 Dysplasia of vagina, unspecified: Secondary | ICD-10-CM | POA: Diagnosis not present

## 2019-08-27 DIAGNOSIS — Z8349 Family history of other endocrine, nutritional and metabolic diseases: Secondary | ICD-10-CM | POA: Diagnosis not present

## 2019-08-27 DIAGNOSIS — Z79899 Other long term (current) drug therapy: Secondary | ICD-10-CM | POA: Diagnosis not present

## 2019-08-27 DIAGNOSIS — E785 Hyperlipidemia, unspecified: Secondary | ICD-10-CM | POA: Insufficient documentation

## 2019-08-27 DIAGNOSIS — D509 Iron deficiency anemia, unspecified: Secondary | ICD-10-CM | POA: Diagnosis not present

## 2019-08-27 DIAGNOSIS — Z833 Family history of diabetes mellitus: Secondary | ICD-10-CM | POA: Diagnosis not present

## 2019-08-27 DIAGNOSIS — Z8249 Family history of ischemic heart disease and other diseases of the circulatory system: Secondary | ICD-10-CM | POA: Insufficient documentation

## 2019-08-27 DIAGNOSIS — N879 Dysplasia of cervix uteri, unspecified: Secondary | ICD-10-CM | POA: Diagnosis not present

## 2019-08-27 DIAGNOSIS — Z888 Allergy status to other drugs, medicaments and biological substances status: Secondary | ICD-10-CM | POA: Diagnosis not present

## 2019-08-27 DIAGNOSIS — Z880 Allergy status to penicillin: Secondary | ICD-10-CM | POA: Diagnosis not present

## 2019-08-27 DIAGNOSIS — F329 Major depressive disorder, single episode, unspecified: Secondary | ICD-10-CM | POA: Insufficient documentation

## 2019-08-27 DIAGNOSIS — I10 Essential (primary) hypertension: Secondary | ICD-10-CM | POA: Insufficient documentation

## 2019-08-27 HISTORY — PX: LESION REMOVAL: SHX5196

## 2019-08-27 HISTORY — DX: Presence of spectacles and contact lenses: Z97.3

## 2019-08-27 HISTORY — DX: Moderate vaginal dysplasia: N89.1

## 2019-08-27 HISTORY — DX: Type 2 diabetes mellitus without complications: E11.9

## 2019-08-27 HISTORY — DX: Iron deficiency anemia, unspecified: D50.9

## 2019-08-27 HISTORY — DX: Mild cervical dysplasia: N87.0

## 2019-08-27 HISTORY — PX: LEEP: SHX91

## 2019-08-27 LAB — POCT I-STAT, CHEM 8
BUN: 14 mg/dL (ref 6–20)
Calcium, Ion: 1.27 mmol/L (ref 1.15–1.40)
Chloride: 105 mmol/L (ref 98–111)
Creatinine, Ser: 0.8 mg/dL (ref 0.44–1.00)
Glucose, Bld: 95 mg/dL (ref 70–99)
HCT: 40 % (ref 36.0–46.0)
Hemoglobin: 13.6 g/dL (ref 12.0–15.0)
Potassium: 4.2 mmol/L (ref 3.5–5.1)
Sodium: 145 mmol/L (ref 135–145)
TCO2: 25 mmol/L (ref 22–32)

## 2019-08-27 SURGERY — LEEP (LOOP ELECTROSURGICAL EXCISION PROCEDURE)
Anesthesia: General | Site: Vagina

## 2019-08-27 MED ORDER — KETOROLAC TROMETHAMINE 30 MG/ML IJ SOLN
INTRAMUSCULAR | Status: AC
  Filled 2019-08-27: qty 1

## 2019-08-27 MED ORDER — ACETIC ACID 4% SOLUTION
Status: DC | PRN
  Administered 2019-08-27: 1 via TOPICAL

## 2019-08-27 MED ORDER — PROPOFOL 10 MG/ML IV BOLUS
INTRAVENOUS | Status: DC | PRN
  Administered 2019-08-27 (×2): 100 mg via INTRAVENOUS
  Administered 2019-08-27: 200 mg via INTRAVENOUS

## 2019-08-27 MED ORDER — ACETAMINOPHEN 500 MG PO TABS
1000.0000 mg | ORAL_TABLET | ORAL | Status: AC
  Administered 2019-08-27: 1000 mg via ORAL

## 2019-08-27 MED ORDER — FENTANYL CITRATE (PF) 100 MCG/2ML IJ SOLN: 25.0000 ug | INTRAMUSCULAR | Status: DC | PRN

## 2019-08-27 MED ORDER — PROPOFOL 10 MG/ML IV BOLUS
INTRAVENOUS | Status: AC
  Filled 2019-08-27: qty 20

## 2019-08-27 MED ORDER — LIDOCAINE 2% (20 MG/ML) 5 ML SYRINGE
INTRAMUSCULAR | Status: DC | PRN
  Administered 2019-08-27: 100 mg via INTRAVENOUS

## 2019-08-27 MED ORDER — DEXAMETHASONE SODIUM PHOSPHATE 10 MG/ML IJ SOLN
INTRAMUSCULAR | Status: DC | PRN
  Administered 2019-08-27: 10 mg via INTRAVENOUS

## 2019-08-27 MED ORDER — LIDOCAINE-EPINEPHRINE (PF) 1 %-1:200000 IJ SOLN
INTRAMUSCULAR | Status: DC | PRN
  Administered 2019-08-27: 10 mL

## 2019-08-27 MED ORDER — ONDANSETRON HCL 4 MG/2ML IJ SOLN
INTRAMUSCULAR | Status: DC | PRN
  Administered 2019-08-27: 4 mg via INTRAVENOUS

## 2019-08-27 MED ORDER — FERRIC SUBSULFATE SOLN
Status: DC | PRN
  Administered 2019-08-27: 1 via TOPICAL

## 2019-08-27 MED ORDER — MIDAZOLAM HCL 5 MG/5ML IJ SOLN
INTRAMUSCULAR | Status: DC | PRN
  Administered 2019-08-27: 2 mg via INTRAVENOUS

## 2019-08-27 MED ORDER — DEXAMETHASONE SODIUM PHOSPHATE 10 MG/ML IJ SOLN
INTRAMUSCULAR | Status: AC
  Filled 2019-08-27: qty 1

## 2019-08-27 MED ORDER — ACETAMINOPHEN 500 MG PO TABS
ORAL_TABLET | ORAL | Status: AC
  Filled 2019-08-27: qty 2

## 2019-08-27 MED ORDER — POVIDONE-IODINE 10 % EX SWAB: 2.0000 "application " | Freq: Once | CUTANEOUS | Status: DC

## 2019-08-27 MED ORDER — FENTANYL CITRATE (PF) 100 MCG/2ML IJ SOLN
INTRAMUSCULAR | Status: AC
  Filled 2019-08-27: qty 2

## 2019-08-27 MED ORDER — LACTATED RINGERS IV SOLN: INTRAVENOUS | Status: DC

## 2019-08-27 MED ORDER — AMISULPRIDE (ANTIEMETIC) 5 MG/2ML IV SOLN: 10.0000 mg | Freq: Once | INTRAVENOUS | Status: DC | PRN

## 2019-08-27 MED ORDER — KETOROLAC TROMETHAMINE 30 MG/ML IJ SOLN
INTRAMUSCULAR | Status: DC | PRN
  Administered 2019-08-27: 30 mg via INTRAVENOUS

## 2019-08-27 MED ORDER — MIDAZOLAM HCL 2 MG/2ML IJ SOLN
INTRAMUSCULAR | Status: AC
  Filled 2019-08-27: qty 2

## 2019-08-27 MED ORDER — ONDANSETRON HCL 4 MG/2ML IJ SOLN
INTRAMUSCULAR | Status: AC
  Filled 2019-08-27: qty 2

## 2019-08-27 MED ORDER — IODINE STRONG (LUGOLS) 5 % PO SOLN
ORAL | Status: DC | PRN
  Administered 2019-08-27: 0.1 mL

## 2019-08-27 MED ORDER — FENTANYL CITRATE (PF) 100 MCG/2ML IJ SOLN
INTRAMUSCULAR | Status: DC | PRN
  Administered 2019-08-27 (×2): 50 ug via INTRAVENOUS

## 2019-08-27 MED ORDER — IBUPROFEN 800 MG PO TABS
800.0000 mg | ORAL_TABLET | Freq: Three times a day (TID) | ORAL | 0 refills | Status: DC | PRN
Start: 2019-08-27 — End: 2019-09-24

## 2019-08-27 MED ORDER — LIDOCAINE 2% (20 MG/ML) 5 ML SYRINGE
INTRAMUSCULAR | Status: AC
  Filled 2019-08-27: qty 5

## 2019-08-27 MED FILL — IBUPROFEN 800 MG TAB: 800 | 10 days supply | Qty: 30 | Fill #0

## 2019-08-27 SURGICAL SUPPLY — 52 items
APL SWBSTK 6 STRL LF DISP (MISCELLANEOUS) ×2
APPLICATOR COTTON TIP 6 STRL (MISCELLANEOUS) ×2 IMPLANT
APPLICATOR COTTON TIP 6IN STRL (MISCELLANEOUS) ×3
BLADE SURG 15 STRL LF DISP TIS (BLADE) ×2 IMPLANT
BLADE SURG 15 STRL SS (BLADE) ×3
CANISTER SUCT 3000ML PPV (MISCELLANEOUS) ×3 IMPLANT
CATH ROBINSON RED A/P 16FR (CATHETERS) IMPLANT
COVER WAND RF STERILE (DRAPES) ×3 IMPLANT
DEPRESSOR TONGUE BLADE STERILE (MISCELLANEOUS) ×3 IMPLANT
ELECT BALL LEEP 3MM BLK (ELECTRODE) IMPLANT
ELECT BALL LEEP 5MM RED (ELECTRODE) ×3 IMPLANT
ELECT LEEP 2.0X0.8 R2008 (MISCELLANEOUS) ×3
ELECT LOOP LEEP RND 10X10 YLW (CUTTING LOOP) ×3
ELECT LOOP LEEP RND 15X12 GRN (CUTTING LOOP)
ELECT LOOP LEEP RND 20X12 WHT (CUTTING LOOP)
ELECT LOOP LEEP SQR 10X10 ORG (CUTTING LOOP)
ELECT LOOP LLETZ 10X10 DISP (CUTTING LOOP) ×3
ELECT REM PT RETURN 9FT ADLT (ELECTROSURGICAL) ×3
ELECTRODE LEEP 2.0X0.8 R2008 (MISCELLANEOUS) ×2 IMPLANT
ELECTRODE LOOP LP RND 10X10YLW (CUTTING LOOP) ×2 IMPLANT
ELECTRODE LOOP LP RND 15X12GRN (CUTTING LOOP) IMPLANT
ELECTRODE LOOP LP RND 20X12WHT (CUTTING LOOP) IMPLANT
ELECTRODE LOOP LP SQR 10X10ORG (CUTTING LOOP) IMPLANT
ELECTRODE LOOP LTZ 10X10 DISP (CUTTING LOOP) ×2 IMPLANT
ELECTRODE REM PT RTRN 9FT ADLT (ELECTROSURGICAL) ×2 IMPLANT
EXTENDER ELECT LOOP LEEP 10CM (CUTTING LOOP) IMPLANT
GAUZE 4X4 16PLY RFD (DISPOSABLE) ×3 IMPLANT
GLOVE BIO SURGEON STRL SZ 6.5 (GLOVE) ×3 IMPLANT
GLOVE BIOGEL PI IND STRL 7.0 (GLOVE) ×4 IMPLANT
GLOVE BIOGEL PI INDICATOR 7.0 (GLOVE) ×2
GLOVE ECLIPSE 6.5 STRL STRAW (GLOVE) ×3 IMPLANT
GOWN STRL REUS W/ TWL LRG LVL3 (GOWN DISPOSABLE) ×2 IMPLANT
GOWN STRL REUS W/TWL LRG LVL3 (GOWN DISPOSABLE) ×6 IMPLANT
KIT TURNOVER CYSTO (KITS) ×3 IMPLANT
NEEDLE HYPO 22GX1.5 SAFETY (NEEDLE) ×3 IMPLANT
NS IRRIG 1000ML POUR BTL (IV SOLUTION) ×3 IMPLANT
PACK VAGINAL MINOR WOMEN LF (CUSTOM PROCEDURE TRAY) ×3 IMPLANT
PACK VAGINAL WOMENS (CUSTOM PROCEDURE TRAY) ×3 IMPLANT
PAD OB MATERNITY 4.3X12.25 (PERSONAL CARE ITEMS) ×3 IMPLANT
PAD PREP 24X48 CUFFED NSTRL (MISCELLANEOUS) ×3 IMPLANT
PENCIL BUTTON HOLSTER BLD 10FT (ELECTRODE) ×3 IMPLANT
PENCIL SMOKE EVAC W/HOLSTER (ELECTROSURGICAL) ×3 IMPLANT
SCOPETTES 8  STERILE (MISCELLANEOUS) ×3
SCOPETTES 8 STERILE (MISCELLANEOUS) ×6 IMPLANT
SUT SILK 2 0 SH (SUTURE) ×3 IMPLANT
SUT VIC AB 4-0 P-3 18XBRD (SUTURE) ×2 IMPLANT
SUT VIC AB 4-0 P3 18 (SUTURE) ×3
TOWEL OR 17X26 10 PK STRL BLUE (TOWEL DISPOSABLE) ×3 IMPLANT
TUBE CONNECTING 12X1/4 (SUCTIONS) ×3 IMPLANT
VACUUM HOSE/TUBING 7/8INX6FT (MISCELLANEOUS) ×3 IMPLANT
WATER STERILE IRR 500ML POUR (IV SOLUTION) ×3 IMPLANT
YANKAUER SUCT BULB TIP NO VENT (SUCTIONS) ×3 IMPLANT

## 2019-08-27 NOTE — Op Note (Signed)
Preoperative Diagnosis: Persistent CIN I, VAIN II  Postoperative Diagnosis: Same  Procedure: Loop cone cervical biopsy under colposcopic guidance, laser ablation of vaginal dysplasia.   Surgeon: Dr Sumner Boast  Assistants: None  Anesthesia: General viaLMA  EBL: 5 cc  Fluids: 500 cc LR  Urine output: not recorded  Indications for surgery: The patient is a 54 yo female, who presented with abnormal pap. Work up included a colposcopy that was unsatisfactory, an endocervical curettage with CIN I and vaginal biopsy with VAIN II The risks of the surgery were reviewed with the patient and the consent form was signed prior to her surgery.  Findings: Colposcopy was unsatisfactory, no cervical lesions were seen. Lugols examination of the vagina revealed several areas of decreased lugols uptake.   Specimens: leep exocervix, leep endocervix (deep margin stitched), endocervical curettage.    Procedure: The patient was taken to the operating room with an IV in place. She was placed in the dorsal lithotomy position and anesthesia was administered. She voided on the way to the OR.  A speculum was placed in the vagina and colposcopy was done. Acetic acid and then lugols was applied to the cervix and vagina. A paracervical block was injected using 1% lidocaine with epinephrine, ~7 cc was used. Under colposcopic guidance, the 1 x 1 cm loop was used to remove a portion of the exocervix taking care to get the entire transformation zone.  A second 1 x 1 cm loop was used to remove a portion of the endocervix. The settings were 55 cut, 33 coag with a blend of 1.  An ECC was performed. The cautery ball was then used to cauterize the base of the biopsy site.  The laser was tested prior to the start of the procedure. The patient's perineum was draped with wet towels. The areas of decreased lugols uptake on the vagina were lasered with 10 watts continuous flow. Area's on the lateral vaginal walls and anterior  vaginal wall were lasered, the posterior vaginal wall had normal lugols uptake. The areas were wiped with a moist sponge. The cervix and vagina were hemostatic. Monsels were placed in the cervical biopsy site.  The patient tolerated the procedure well.   Upon awakening the was removed and the patient was transferred to the recovery room in stable and awake condition.  The sponge and instrument count were correct.

## 2019-08-27 NOTE — Anesthesia Preprocedure Evaluation (Signed)
Anesthesia Evaluation  Patient identified by MRN, date of birth, ID band Patient awake    Reviewed: Allergy & Precautions, NPO status , Patient's Chart, lab work & pertinent test results  Airway Mallampati: II  TM Distance: >3 FB Neck ROM: Full    Dental  (+) Dental Advisory Given   Pulmonary neg pulmonary ROS,    breath sounds clear to auscultation       Cardiovascular hypertension, Pt. on medications  Rhythm:Regular Rate:Normal     Neuro/Psych negative neurological ROS     GI/Hepatic Neg liver ROS, GERD  ,  Endo/Other  diabetes, Type 2  Renal/GU negative Renal ROS     Musculoskeletal   Abdominal   Peds  Hematology negative hematology ROS (+)   Anesthesia Other Findings   Reproductive/Obstetrics                             Anesthesia Physical Anesthesia Plan  ASA: II  Anesthesia Plan: General   Post-op Pain Management:    Induction: Intravenous  PONV Risk Score and Plan: 3 and Midazolam, Dexamethasone, Ondansetron and Treatment may vary due to age or medical condition  Airway Management Planned: LMA  Additional Equipment:   Intra-op Plan:   Post-operative Plan: Extubation in OR  Informed Consent: I have reviewed the patients History and Physical, chart, labs and discussed the procedure including the risks, benefits and alternatives for the proposed anesthesia with the patient or authorized representative who has indicated his/her understanding and acceptance.     Dental advisory given  Plan Discussed with: CRNA  Anesthesia Plan Comments:         Anesthesia Quick Evaluation

## 2019-08-27 NOTE — Anesthesia Postprocedure Evaluation (Signed)
Anesthesia Post Note  Patient: Lisset Ketchem  Procedure(s) Performed: LOOP ELECTROSURGICAL EXCISION PROCEDURE (LEEP)/CERVICAL BIOPSY AND REMOVAL/ECC with colposcopy (N/A Vagina ) LASER ABLATION OF Vaginal dysplasia (N/A Vagina )     Patient location during evaluation: PACU Anesthesia Type: General Level of consciousness: awake and alert Pain management: pain level controlled Vital Signs Assessment: post-procedure vital signs reviewed and stable Respiratory status: spontaneous breathing, nonlabored ventilation, respiratory function stable and patient connected to nasal cannula oxygen Cardiovascular status: blood pressure returned to baseline and stable Postop Assessment: no apparent nausea or vomiting Anesthetic complications: no   No complications documented.  Last Vitals:  Vitals:   08/27/19 1200 08/27/19 1215  BP: 125/81 125/77  Pulse: 74 74  Resp: 14 15  Temp:    SpO2: 96% 100%    Last Pain:  Vitals:   08/27/19 1230  TempSrc:   PainSc: 0-No pain                 Tiajuana Amass

## 2019-08-27 NOTE — Anesthesia Procedure Notes (Addendum)
Procedure Name: LMA Insertion Date/Time: 08/27/2019 10:53 AM Performed by: Bonney Aid, CRNA Pre-anesthesia Checklist: Patient identified, Emergency Drugs available, Suction available and Patient being monitored Patient Re-evaluated:Patient Re-evaluated prior to induction Oxygen Delivery Method: Circle system utilized Preoxygenation: Pre-oxygenation with 100% oxygen Induction Type: IV induction Ventilation: Mask ventilation without difficulty LMA: LMA inserted LMA Size: 4.0 Number of attempts: 1 Airway Equipment and Method: Bite block Placement Confirmation: positive ETCO2 Tube secured with: Tape Dental Injury: Teeth and Oropharynx as per pre-operative assessment  Comments: Pt resistant to propofol, 3 boluses given and deepened w sevo.  Mouth small.  LMA seated well.

## 2019-08-27 NOTE — Interval H&P Note (Signed)
History and Physical Interval Note:  08/27/2019 10:40 AM  Rachael Hodge  has presented today for surgery, with the diagnosis of Vaginal dysplasia,cervical dysplasia.  The various methods of treatment have been discussed with the patient and family. After consideration of risks, benefits and other options for treatment, the patient has consented to  Procedure(s) with comments: COLPOSCOPY/CERVICAL BIOPSY (N/A) - cervical biopsy LOOP ELECTROSURGICAL EXCISION PROCEDURE (LEEP)/CERVICAL BIOPSY AND REMOVAL/ECC (N/A) - CERVICAL BIOPSY AND REMOVAL/ECC EXCISION VAGINAL LESION/POSSIBLE EXCISION VS LASER ABLATION OF VAIN (N/A) - POSSIBLE EXCISION VS LASER ABLATION OF VAIN as a surgical intervention.  The patient's history has been reviewed, patient examined, no change in status, stable for surgery.  I have reviewed the patient's chart and labs.  Questions were answered to the patient's satisfaction.     [name]

## 2019-08-27 NOTE — Addendum Note (Signed)
Addendum  created 08/27/19 1313 by Justice Rocher, CRNA   Charge Capture section accepted

## 2019-08-27 NOTE — Transfer of Care (Addendum)
Immediate Anesthesia Transfer of Care Note  Patient: Rachael Hodge  Procedure(s) Performed: Procedure(s) (LRB): LOOP ELECTROSURGICAL EXCISION PROCEDURE (LEEP)/CERVICAL BIOPSY AND REMOVAL/ECC with colposcopy (N/A) LASER ABLATION OF Vaginal dysplasia (N/A)  Patient Location: PACU  Anesthesia Type: General  Level of Consciousness: awake, sedated, patient cooperative and responds to stimulation  Airway & Oxygen Therapy: Patient Spontanous Breathing and Patient connected to [place] O2 and soft FM   Post-op Assessment: Report given to PACU RN, Post -op Vital signs reviewed and stable and Patient moving all extremities  Post vital signs: Reviewed and stable  Complications: trauma to frenula with LMA

## 2019-08-27 NOTE — Discharge Instructions (Signed)
  Post Anesthesia Home Care Instructions  Activity: Get plenty of rest for the remainder of the day. A responsible individual must stay with you for 24 hours following the procedure.  For the next 24 hours, DO NOT: -Drive a car -Paediatric nurse -Drink alcoholic beverages -Take any medication unless instructed by your physician -Make any legal decisions or sign important papers.  Meals: Start with liquid foods such as gelatin or soup. Progress to regular foods as tolerated. Avoid greasy, spicy, heavy foods. If nausea and/or vomiting occur, drink only clear liquids until the nausea and/or vomiting subsides. Call your physician if vomiting continues.  Special Instructions/Symptoms: Your throat may feel dry or sore from the anesthesia or the breathing tube placed in your throat during surgery. If this causes discomfort, gargle with warm salt water. The discomfort should disappear within 24 hours.  If you had a scopolamine patch placed behind your ear for the management of post- operative nausea and/or vomiting:  1. The medication in the patch is effective for 72 hours, after which it should be removed.  Wrap patch in a tissue and discard in the trash. Wash hands thoroughly with soap and water. 2. You may remove the patch earlier than 72 hours if you experience unpleasant side effects which may include dry mouth, dizziness or visual disturbances. 3. Avoid touching the patch. Wash your hands with soap and water after contact with the patch.    Do not take any Tylenol until after 2:30 pm today.

## 2019-08-28 ENCOUNTER — Encounter (HOSPITAL_BASED_OUTPATIENT_CLINIC_OR_DEPARTMENT_OTHER): Payer: Self-pay | Admitting: Obstetrics and Gynecology

## 2019-08-29 ENCOUNTER — Encounter: Payer: Self-pay | Admitting: Obstetrics and Gynecology

## 2019-08-29 ENCOUNTER — Telehealth: Payer: Self-pay | Admitting: Obstetrics and Gynecology

## 2019-08-29 NOTE — Telephone Encounter (Signed)
Spoke with patient. S/p LEEP 08/27/19 at [facility].  Patient reports she has a blood blister and scrape inside her mouth that is painful, states anesthesia discussed this with her after procedure was completed. She has been rinsing with warm salt water for comfort.   Woke up this morning with "muscle fatigue" in both arms and legs, has improved as she has been moving around, she took OTC ibuprofen this morning, seems to be improving.   Denies fever/chills, N/V. Bleeding is light, no pelvic pain. Hydrating well and has an appetite.   Advised patient to continue OTC ibuprofen prn and salt water rinses, also discussed OTC options such as Biotene. If symptoms do not continue to improve or new symptoms develop, return call to the office. I will review with Dr. Talbert Nan and return call w/ any additional recommendations. Patient agreeable and thankful for call.  F/u scheduled for 09/24/19.   Routing to provider for final review.

## 2019-08-29 NOTE — Telephone Encounter (Signed)
Rachael Hodge [facility] I felt pretty good yesterday. Only thing that is really bothering me is my mouth, throat and neck.  Today I woke up with muscle fatigue and feel like you do at the end of the flu.  Is this from the anesthesia?

## 2019-08-30 LAB — SURGICAL PATHOLOGY

## 2019-09-03 NOTE — Addendum Note (Signed)
Addended by: Marrion Coy on: 09/03/2019 03:50 PM   Modules accepted: Orders

## 2019-09-24 ENCOUNTER — Encounter: Payer: Self-pay | Admitting: Obstetrics and Gynecology

## 2019-09-24 ENCOUNTER — Ambulatory Visit (INDEPENDENT_AMBULATORY_CARE_PROVIDER_SITE_OTHER): Payer: 59 | Admitting: Obstetrics and Gynecology

## 2019-09-24 ENCOUNTER — Other Ambulatory Visit: Payer: Self-pay | Admitting: Obstetrics and Gynecology

## 2019-09-24 ENCOUNTER — Other Ambulatory Visit: Payer: Self-pay | Admitting: Family Medicine

## 2019-09-24 ENCOUNTER — Other Ambulatory Visit: Payer: Self-pay

## 2019-09-24 VITALS — BP 116/70 | HR 71 | Ht 62.0 in | Wt 160.8 lb

## 2019-09-24 DIAGNOSIS — N952 Postmenopausal atrophic vaginitis: Secondary | ICD-10-CM

## 2019-09-24 DIAGNOSIS — Z9889 Other specified postprocedural states: Secondary | ICD-10-CM | POA: Diagnosis not present

## 2019-09-24 DIAGNOSIS — Z87411 Personal history of vaginal dysplasia: Secondary | ICD-10-CM | POA: Diagnosis not present

## 2019-09-24 DIAGNOSIS — N941 Unspecified dyspareunia: Secondary | ICD-10-CM | POA: Diagnosis not present

## 2019-09-24 DIAGNOSIS — Z1231 Encounter for screening mammogram for malignant neoplasm of breast: Secondary | ICD-10-CM

## 2019-09-24 MED ORDER — ESTRADIOL 10 MCG VA TABS: ORAL_TABLET | VAGINAL | 2 refills | Status: DC

## 2019-09-24 MED FILL — ESTRADIOL 10 MCG TABS: 10 | 66 days supply | Qty: 24 | Fill #0

## 2019-09-24 NOTE — Patient Instructions (Signed)
Atrophic Vaginitis  Atrophic vaginitis is a condition in which the tissues that line the vagina become dry and thin. This condition is most common in women who have stopped having regular menstrual periods (are in menopause). This usually starts when a woman is 45-55 years old. That is the time when a woman's estrogen levels begin to drop (decrease). Estrogen is a female hormone. It helps to keep the tissues of the vagina moist. It stimulates the vagina to produce a clear fluid that lubricates the vagina for sexual intercourse. This fluid also protects the vagina from infection. Lack of estrogen can cause the lining of the vagina to get thinner and dryer. The vagina may also shrink in size. It may become less elastic. Atrophic vaginitis tends to get worse over time as a woman's estrogen level drops. What are the causes? This condition is caused by the normal drop in estrogen that happens around the time of menopause. What increases the risk? Certain conditions or situations may lower a woman's estrogen level, leading to a higher risk for atrophic vaginitis. You are more likely to develop this condition if:  You are taking medicines that block estrogen.  You have had your ovaries removed.  You are being treated for cancer with X-ray (radiation) or medicines (chemotherapy).  You have given birth or are breastfeeding.  You are older than age 50.  You smoke. What are the signs or symptoms? Symptoms of this condition include:  Pain, soreness, or bleeding during sexual intercourse (dyspareunia).  Vaginal burning, irritation, or itching.  Pain or bleeding when a speculum is used in a vaginal exam (pelvic exam).  Having burning pain when passing urine.  Vaginal discharge that is brown or yellow. In some cases, there are no symptoms. How is this diagnosed? This condition is diagnosed by taking a medical history and doing a physical exam. This will include a pelvic exam that checks the  vaginal tissues. Though rare, you may also have other tests, including:  A urine test.  A test that checks the acid balance in your vagina (acid balance test). How is this treated? Treatment for this condition depends on how severe your symptoms are. Treatment may include:  Using an over-the-counter vaginal lubricant before sex.  Using a long-acting vaginal moisturizer.  Using low-dose vaginal estrogen for moderate to severe symptoms that do not respond to other treatments. Options include creams, tablets, and inserts (vaginal rings). Before you use a vaginal estrogen, tell your health care provider if you have a history of: ? Breast cancer. ? Endometrial cancer. ? Blood clots. If you are not sexually active and your symptoms are very mild, you may not need treatment. Follow these instructions at home: Medicines  Take over-the-counter and prescription medicines only as told by your health care provider. Do not use herbal or alternative medicines unless your health care provider says that you can.  Use over-the-counter creams, lubricants, or moisturizers for dryness only as directed by your health care provider. General instructions  If your atrophic vaginitis is caused by menopause, discuss all of your menopause symptoms and treatment options with your health care provider.  Do not douche.  Do not use products that can make your vagina dry. These include: ? Scented feminine sprays. ? Scented tampons. ? Scented soaps.  Vaginal intercourse can help to improve blood flow and elasticity of vaginal tissue. If it hurts to have sex, try using a lubricant or moisturizer just before having intercourse. Contact a health care provider if:    Your discharge looks different than normal.  Your vagina has an unusual smell.  You have new symptoms.  Your symptoms do not improve with treatment.  Your symptoms get worse. Summary  Atrophic vaginitis is a condition in which the tissues that  line the vagina become dry and thin. It is most common in women who have stopped having regular menstrual periods (are in menopause).  Treatment options include using vaginal lubricants and low-dose vaginal estrogen.  Contact a health care provider if your vagina has an unusual smell, or if your symptoms get worse or do not improve after treatment. This information is not intended to replace advice given to you by your health care provider. Make sure you discuss any questions you have with your health care provider. Document Revised: 12/31/2016 Document Reviewed: 10/14/2016 Elsevier Patient Education  2020 Elsevier Inc.  

## 2019-09-24 NOTE — Progress Notes (Signed)
GYNECOLOGY  VISIT   HPI: 54 y.o.   Married White or Caucasian Not Hispanic or Latino  female   [phone] with Patient's last menstrual period was 12/02/2012.   here for 4 week post op   LOOP ELECTROSURGICAL EXCISION PROCEDURE (LEEP)/CERVICAL BIOPSY AND REMOVAL/ECC with colposcopy (N/A Vagina ) LASER ABLATION OF Vaginal dysplasia (N/A Vagina ) Leep pathology with CIN II-III, superficial portion with + margins, endocervical leep and ecc were negative for dysplasia.  On questioning she does c/o dyspareunia, not really helped with lubricant.  GYNECOLOGIC HISTORY: Patient's last menstrual period was 12/02/2012. Contraception:Postmenopausal Menopausal hormone therapy: none        OB History    Gravida  2   Para  2   Term  2   Preterm      AB      Living  2     SAB      TAB      Ectopic      Multiple      Live Births  2              Patient Active Problem List   Diagnosis Date Noted   Diabetes mellitus type II, controlled ([place]) 10/16/2018   Gastritis 07/03/2018   Low serum vitamin B12 07/03/2018   Heme positive stool    Iron deficiency anemia 10/08/2016   Hyperlipemia 09/29/2015   Essential hypertension 09/29/2015   Major depressive disorder with single episode, in full remission ([place]) 09/29/2015    Past Medical History:  Diagnosis Date   CIN I (cervical intraepithelial neoplasia I)    Depression    Diet-controlled type 2 diabetes mellitus ([place])    followed by pcp--- (08-17-2019 per pt only on occasion)   Eczema    GERD (gastroesophageal reflux disease)    History of cervical dysplasia 2006   s/p LEEP   Hyperlipidemia    Hypertension    followed by pcp   (08-17-2019 per pt never had a stress test)   IDA (iron deficiency anemia)    VAIN II (vaginal intraepithelial neoplasia grade II)    Wears glasses     Past Surgical History:  Procedure Laterality Date   COLONOSCOPY WITH ESOPHAGOGASTRODUODENOSCOPY (EGD)  04-29-2017  dr Silverio Decamp    GIVENS CAPSULE STUDY N/A 05/09/2017   Procedure: GIVENS CAPSULE STUDY;  Surgeon: Mauri Pole, MD;  Location: [facility];  Service: Endoscopy;  Laterality: N/A;   LEEP  2006   LEEP N/A 08/27/2019   Procedure: LOOP ELECTROSURGICAL EXCISION PROCEDURE (LEEP)/CERVICAL BIOPSY AND REMOVAL/ECC with colposcopy;  Surgeon: [name], MD;  Location: [facility];  Service: Gynecology;  Laterality: N/A;  CERVICAL BIOPSY AND REMOVAL/ECC   LESION REMOVAL N/A 08/27/2019   Procedure: LASER ABLATION OF Vaginal dysplasia;  Surgeon: [name], MD;  Location: [facility];  Service: Gynecology;  Laterality: N/A;    Current Outpatient Medications  Medication Sig Dispense Refill   ferrous sulfate 325 (65 FE) MG tablet Take 1 tablet (325 mg total) by mouth daily with breakfast. (Patient taking differently: Take 325 mg by mouth daily with breakfast. )  3   FLUoxetine (PROZAC) 40 MG capsule TAKE 1 CAPSULE BY MOUTH ONCE DAILY 90 capsule 1   losartan (COZAAR) 50 MG tablet Take 1 tablet (50 mg total) by mouth daily. (Patient taking differently: Take 50 mg by mouth daily. ) 90 tablet 3   omeprazole (PRILOSEC) 40 MG capsule TAKE 1 CAPSULE BY MOUTH TWICE DAILY BEFORE A  MEAL (Patient taking differently: Take 40 mg by mouth daily. ) 180 capsule 0   pravastatin (PRAVACHOL) 40 MG tablet Take 1 tablet (40 mg total) by mouth daily. 90 tablet 3   vitamin B-12 100 MCG tablet Take 1 tablet (100 mcg total) by mouth daily.     No current facility-administered medications for this visit.     ALLERGIES: Penicillins, Sulfa antibiotics, Feraheme [ferumoxytol], and Lisinopril  Family History  Problem Relation Age of Onset   Early death Mother 9       died in sleep; uncertain cause   Hyperlipidemia Father    Hypertension Father    Stroke Father 59   CAD Father 30       CAD, quadruple bypass   Heart disease Paternal Grandfather 6   Crohn's disease Son     Heart attack Maternal Grandmother    Alzheimer's disease Maternal Grandmother    Heart attack Maternal Grandfather    Heart attack Paternal Grandmother    Diabetes Paternal Grandmother    Breast cancer Neg Hx    Colon cancer Neg Hx     Social History   Socioeconomic History   Marital status: Married    Spouse name: Not on file   Number of children: Not on file   Years of education: Not on file   Highest education level: Not on file  Occupational History   Not on file  Tobacco Use   Smoking status: Never Smoker   Smokeless tobacco: Never Used  Vaping Use   Vaping Use: Never used  Substance and Sexual Activity   Alcohol use: Yes    Alcohol/week: 2.0 standard drinks    Types: 2 Standard drinks or equivalent per week   Drug use: Never   Sexual activity: Yes    Partners: Male    Birth control/protection: Post-menopausal  Other Topics Concern   Not on file  Social History Narrative   Work or School: Research scientist (life sciences)       Home Situation: lives with boyfriend      Spiritual Beliefs: Christian      Lifestyle: exercising, trying to eat well            Social Determinants of Health   Financial Resource Strain:    Difficulty of Paying Living Expenses: Not on file  Food Insecurity:    Worried About Charity fundraiser in the Last Year: Not on file   [employer] of Food in the Last Year: Not on file  Transportation Needs:    Lack of Transportation (Medical): Not on file   Lack of Transportation (Non-Medical): Not on file  Physical Activity:    Days of Exercise per Week: Not on file   Minutes of Exercise per Session: Not on file  Stress:    Feeling of Stress : Not on file  Social Connections:    Frequency of Communication with Friends and Family: Not on file   Frequency of Social Gatherings with Friends and Family: Not on file   Attends Religious Services: Not on file   Active Member of Clubs or Organizations: Not  on file   Attends Archivist Meetings: Not on file   Marital Status: Not on file  Intimate Partner Violence:    Fear of Current or Ex-Partner: Not on file   Emotionally Abused: Not on file   Physically Abused: Not on file   Sexually Abused: Not on file    Review of Systems  Constitutional: Negative.  HENT: Negative.   Eyes: Negative.   Respiratory: Negative.   Cardiovascular: Negative.   Gastrointestinal: Negative.   Genitourinary: Negative.   Musculoskeletal: Negative.   Skin: Negative.   Neurological: Negative.   Endo/Heme/Allergies: Negative.   Psychiatric/Behavioral: Negative.     PHYSICAL EXAMINATION:    BP 116/70 (BP Location: Right Arm, Patient Position: Sitting, Cuff Size: Normal)    Pulse 71    Ht 5' 2"  (1.575 m)    Wt 160 lb 12.8 oz (72.9 kg)    LMP 12/02/2012    SpO2 100%    BMI 29.41 kg/m     General appearance: alert, cooperative and appears stated age  Pelvic: External genitalia:  no lesions              Urethra:  normal appearing urethra with no masses, tenderness or lesions              Bartholins and Skenes: normal                 Vagina: very atrophic vaginal mucosa, some friable tissue in either vaginal fornix, both treated with silver nitrate.              Cervix: no lesions and short              Bimanual Exam:  Uterus:  normal size, contour, position, consistency, mobility, non-tender              Adnexa: no mass, fullness, tenderness               Chaperone was present for exam.  ASSESSMENT H/O Leep and laser of vaginal dysplasia, healing well. Discussed pathology Vaginal atrophy Dyspareunia    PLAN Start vaginal estrogen Continue to use lubrication with intercourse She has an annual in 3/22, will do her f/u pap then

## 2019-09-28 ENCOUNTER — Other Ambulatory Visit: Payer: Self-pay

## 2019-09-28 ENCOUNTER — Other Ambulatory Visit (INDEPENDENT_AMBULATORY_CARE_PROVIDER_SITE_OTHER): Payer: 59

## 2019-09-28 DIAGNOSIS — E785 Hyperlipidemia, unspecified: Secondary | ICD-10-CM

## 2019-09-28 DIAGNOSIS — E119 Type 2 diabetes mellitus without complications: Secondary | ICD-10-CM | POA: Diagnosis not present

## 2019-09-28 DIAGNOSIS — E875 Hyperkalemia: Secondary | ICD-10-CM | POA: Diagnosis not present

## 2019-09-29 LAB — LIPID PANEL
Cholesterol: 237 mg/dL — ABNORMAL HIGH (ref <200)
HDL: 74 mg/dL (ref >=50)
LDL Cholesterol (Calc): 141 mg/dL (calc) — ABNORMAL HIGH
Non-HDL Cholesterol (Calc): 163 mg/dL (calc) — ABNORMAL HIGH (ref <130)
Total CHOL/HDL Ratio: 3.2 (calc) (ref <5.0)
Triglycerides: 106 mg/dL (ref <150)

## 2019-09-29 LAB — BASIC METABOLIC PANEL
BUN: 10 mg/dL (ref 7–25)
CO2: 28 mmol/L (ref 20–32)
Calcium: 9.7 mg/dL (ref 8.6–10.4)
Chloride: 103 mmol/L (ref 98–110)
Creat: 0.97 mg/dL (ref 0.50–1.05)
Glucose, Bld: 111 mg/dL — ABNORMAL HIGH (ref 65–99)
Potassium: 4.8 mmol/L (ref 3.5–5.3)
Sodium: 139 mmol/L (ref 135–146)

## 2019-09-29 LAB — HEMOGLOBIN A1C
Hgb A1c MFr Bld: 6.3 % of total Hgb — ABNORMAL HIGH (ref <5.7)
Mean Plasma Glucose: 134 (calc)
eAG (mmol/L): 7.4 (calc)

## 2019-10-09 ENCOUNTER — Other Ambulatory Visit: Payer: Self-pay | Admitting: Family Medicine

## 2019-10-09 DIAGNOSIS — E785 Hyperlipidemia, unspecified: Secondary | ICD-10-CM

## 2019-10-09 DIAGNOSIS — I10 Essential (primary) hypertension: Secondary | ICD-10-CM

## 2019-10-09 MED FILL — PRAVASTATIN NA 40 MG TAB: 40 | 90 days supply | Qty: 90 | Fill #0

## 2019-10-09 MED FILL — LOSARTAN POTASSIUM 50 MG TA: 50 | 90 days supply | Qty: 90 | Fill #0

## 2019-10-29 ENCOUNTER — Ambulatory Visit: Payer: 59

## 2019-11-07 ENCOUNTER — Other Ambulatory Visit: Payer: Self-pay

## 2019-11-07 ENCOUNTER — Ambulatory Visit
Admission: RE | Admit: 2019-11-07 | Discharge: 2019-11-07 | Disposition: A | Discharge status: Home / Self Care | Payer: 59 | Source: Ambulatory Visit | Attending: Family Medicine | Admitting: Family Medicine

## 2019-11-07 DIAGNOSIS — Z1231 Encounter for screening mammogram for malignant neoplasm of breast: Secondary | ICD-10-CM | POA: Diagnosis not present

## 2019-11-23 MED FILL — ESTRADIOL 10 MCG TABS: 10 | 66 days supply | Qty: 24 | Fill #1

## 2019-11-23 MED FILL — FLUoxetine HCL 40 MG CAPS: 40 | 90 days supply | Qty: 90 | Fill #1

## 2019-12-31 ENCOUNTER — Ambulatory Visit: Payer: 59 | Admitting: Family Medicine

## 2020-01-07 ENCOUNTER — Other Ambulatory Visit: Payer: Self-pay | Admitting: Gastroenterology

## 2020-01-07 MED FILL — PRAVASTATIN NA 40 MG TAB: 40 | 90 days supply | Qty: 90 | Fill #1

## 2020-01-07 MED FILL — OMEPRAZOLE 40 MG CPDR: 40 | 90 days supply | Qty: 180 | Fill #0

## 2020-01-07 MED FILL — LOSARTAN POTASSIUM 50 MG TA: 50 | 90 days supply | Qty: 90 | Fill #1

## 2020-02-28 ENCOUNTER — Other Ambulatory Visit: Payer: Self-pay | Admitting: Gastroenterology

## 2020-02-28 ENCOUNTER — Ambulatory Visit: Payer: 59 | Admitting: Gastroenterology

## 2020-02-28 ENCOUNTER — Other Ambulatory Visit: Payer: Self-pay

## 2020-02-28 ENCOUNTER — Encounter: Payer: Self-pay | Admitting: Gastroenterology

## 2020-02-28 VITALS — BP 134/80 | HR 57 | Ht 62.0 in | Wt 171.0 lb

## 2020-02-28 DIAGNOSIS — D509 Iron deficiency anemia, unspecified: Secondary | ICD-10-CM

## 2020-02-28 DIAGNOSIS — K2210 Ulcer of esophagus without bleeding: Secondary | ICD-10-CM

## 2020-02-28 MED ORDER — OMEPRAZOLE 20 MG PO CPDR
20.0000 mg | DELAYED_RELEASE_CAPSULE | Freq: Every day | ORAL | 3 refills | Status: DC
Start: 2020-02-28 — End: 2020-02-28

## 2020-02-28 MED FILL — OMEPRAZOLE 20 MG CAP: 20 | 90 days supply | Qty: 90 | Fill #0

## 2020-02-28 NOTE — Patient Instructions (Signed)
Have your PCP fax [place] the results to your lab work to [phone] Att Dona Klemann   Decrease Omeprazole to 20 mg daily    [employer]'s Current Therapy 2021 (pp. 213-216). [place], [place]: Elsevier.">  Gastroesophageal Reflux Disease, Adult Gastroesophageal reflux (GER) happens when acid from the stomach flows up into the tube that connects the mouth and the stomach (esophagus). Normally, food travels down the esophagus and stays in the stomach to be digested. However, when a person has GER, food and stomach acid sometimes move back up into the esophagus. If this becomes a more serious problem, the person may be diagnosed with a disease called gastroesophageal reflux disease (GERD). GERD occurs when the reflux:  Happens often.  Causes frequent or severe symptoms.  Causes problems such as damage to the esophagus. When stomach acid comes in contact with the esophagus, the acid may cause inflammation in the esophagus. Over time, GERD may create small holes (ulcers) in the lining of the esophagus. What are the causes? This condition is caused by a problem with the muscle between the esophagus and the stomach (lower esophageal sphincter, or LES). Normally, the LES muscle closes after food passes through the esophagus to the stomach. When the LES is weakened or abnormal, it does not close properly, and that allows food and stomach acid to go back up into the esophagus. The LES can be weakened by certain dietary substances, medicines, and medical conditions, including:  Tobacco use.  Pregnancy.  Having a hiatal hernia.  Alcohol use.  Certain foods and beverages, such as coffee, chocolate, onions, and peppermint. What increases the risk? You are more likely to develop this condition if you:  Have an increased body weight.  Have a connective tissue disorder.  Take NSAIDs, such as ibuprofen. What are the signs or symptoms? Symptoms of this condition include:  Heartburn.  Difficult or painful  swallowing and the feeling of having a lump in the throat.  A bitter taste in the mouth.  Bad breath and having a large amount of saliva.  Having an upset or bloated stomach and belching.  Chest pain. Different conditions can cause chest pain. Make sure you see your health care provider if you experience chest pain.  Shortness of breath or wheezing.  Ongoing (chronic) cough or a nighttime cough.  Wearing away of tooth enamel.  Weight loss. How is this diagnosed? This condition may be diagnosed based on a medical history and a physical exam. To determine if you have mild or severe GERD, your health care provider may also monitor how you respond to treatment. You may also have tests, including:  A test to examine your stomach and esophagus with a small camera (endoscopy).  A test that measures the acidity level in your esophagus.  A test that measures how much pressure is on your esophagus.  A barium swallow or modified barium swallow test to show the shape, size, and functioning of your esophagus. How is this treated? Treatment for this condition may vary depending on how severe your symptoms are. Your health care provider may recommend:  Changes to your diet.  Medicine.  Surgery. The goal of treatment is to help relieve your symptoms and to prevent complications. Follow these instructions at home: Eating and drinking  Follow a diet as recommended by your health care provider. This may involve avoiding foods and drinks such as: ? Coffee and tea, with or without caffeine. ? Drinks that contain alcohol. ? Energy drinks and sports drinks. ? Carbonated drinks  or sodas. ? Chocolate and cocoa. ? Peppermint and mint flavorings. ? Garlic and onions. ? Horseradish. ? Spicy and acidic foods, including peppers, chili powder, curry powder, vinegar, hot sauces, and barbecue sauce. ? Citrus fruit juices and citrus fruits, such as oranges, lemons, and limes. ? Tomato-based foods,  such as red sauce, chili, salsa, and pizza with red sauce. ? Fried and fatty foods, such as donuts, french fries, potato chips, and high-fat dressings. ? High-fat meats, such as hot dogs and fatty cuts of red and white meats, such as rib eye steak, sausage, ham, and bacon. ? High-fat dairy items, such as whole milk, butter, and cream cheese.  Eat small, frequent meals instead of large meals.  Avoid drinking large amounts of liquid with your meals.  Avoid eating meals during the 2-3 hours before bedtime.  Avoid lying down right after you eat.  Do not exercise right after you eat.   Lifestyle  Do not use any products that contain nicotine or tobacco. These products include cigarettes, chewing tobacco, and vaping devices, such as e-cigarettes. If you need help quitting, ask your health care provider.  Try to reduce your stress by using methods such as yoga or meditation. If you need help reducing stress, ask your health care provider.  If you are overweight, reduce your weight to an amount that is healthy for you. Ask your health care provider for guidance about a safe weight loss goal.   General instructions  Pay attention to any changes in your symptoms.  Take over-the-counter and prescription medicines only as told by your health care provider. Do not take aspirin, ibuprofen, or other NSAIDs unless your health care provider told you to take these medicines.  Wear loose-fitting clothing. Do not wear anything tight around your waist that causes pressure on your abdomen.  Raise (elevate) the head of your bed about 6 inches (15 cm). You can use a wedge to do this.  Avoid bending over if this makes your symptoms worse.  Keep all follow-up visits. This is important. Contact a health care provider if:  You have: ? New symptoms. ? Unexplained weight loss. ? Difficulty swallowing or it hurts to swallow. ? Wheezing or a persistent cough. ? A hoarse voice.  Your symptoms do not  improve with treatment. Get help right away if:  You have sudden pain in your arms, neck, jaw, teeth, or back.  You suddenly feel sweaty, dizzy, or light-headed.  You have chest pain or shortness of breath.  You vomit and the vomit is green, yellow, or black, or it looks like blood or coffee grounds.  You faint.  You have stool that is red, bloody, or black.  You cannot swallow, drink, or eat. These symptoms may represent a serious problem that is an emergency. Do not wait to see if the symptoms will go away. Get medical help right away. Call your local emergency services (911 in the [place] Do not drive yourself to the hospital. Summary  Gastroesophageal reflux happens when acid from the stomach flows up into the esophagus. GERD is a disease in which the reflux happens often, causes frequent or severe symptoms, or causes problems such as damage to the esophagus.  Treatment for this condition may vary depending on how severe your symptoms are. Your health care provider may recommend diet and lifestyle changes, medicine, or surgery.  Contact a health care provider if you have new or worsening symptoms.  Take over-the-counter and prescription medicines only as told by  your health care provider. Do not take aspirin, ibuprofen, or other NSAIDs unless your health care provider told you to do so.  Keep all follow-up visits as told by your health care provider. This is important. This information is not intended to replace advice given to you by your health care provider. Make sure you discuss any questions you have with your health care provider. Document Revised: 07/30/2019 Document Reviewed: 07/30/2019 Elsevier Patient Education  [employer].   Follow up 1 year  Due to recent changes in healthcare laws, you may see the results of your imaging and laboratory studies on MyChart before your provider has had a chance to review them.  We understand that in some cases there may be  results that are confusing or concerning to you. Not all laboratory results come back in the same time frame and the provider may be waiting for multiple results in order to interpret others.  Please give [place] 48 hours in order for your provider to thoroughly review all the results before contacting the office for clarification of your results.   I appreciate the  opportunity to care for you  Thank You   Harl Bowie , MD

## 2020-02-28 NOTE — Progress Notes (Signed)
Rachael Hodge    [id number]    Jul 05, 1965  Primary Care Physician:Koberlein, Steele Berg, MD  Referring Physician: Caren Macadam, MD [place],   [id number]   Chief complaint: Medication management  HPI:   55 year old very pleasant female with history of iron deficiency anemia here for follow-up visit for gastritis.  She is doing well overall.  Denies any nausea, vomiting, abdominal pain, melena, dark stool or blood per rectum.  She is taking oral iron supplements. She would like to come off omeprazole, as she has been taking it for a while and does not want to take it forever if she does not need to  Iron studies August 23, 2017 showed iron level 65, ferritin 18.4 and normal hemoglobin 12.8.  She is feeling well overall and feels her energy level is back to baseline.  She is not on any NSAIDs, also stopped low-dose aspirin that she was taking before.  No history of CAD.   Relevant GI history:   EGD 04/29/2017: small hiatal hernia, minimal gastritis. H.pylori negative Colonoscopy 04/29/2017:small hyperplastic polyps, internal hemorrhoids otherwise normal Small bowel video capsule 05/09/17 scattered erythema and erosions in the terminal ileum CRP was normal.  Empirically treated with 40-monthcourse of budesonide 9 mg daily.  Patient reports significant improvement with increased energy level.    Outpatient Encounter Medications as of 02/28/2020  Medication Sig  . Estradiol 10 MCG TABS vaginal tablet Place one tablet vaginally qhs x one week, then space out to 2 x a week.  . ferrous sulfate 325 (65 FE) MG tablet Take 1 tablet (325 mg total) by mouth daily with breakfast.  . FLUoxetine (PROZAC) 40 MG capsule TAKE 1 CAPSULE BY MOUTH ONCE DAILY  . losartan (COZAAR) 50 MG tablet TAKE 1 TABLET BY MOUTH ONCE DAILY  . omeprazole (PRILOSEC) 40 MG capsule TAKE 1 CAPSULE BY MOUTH TWICE DAILY BEFORE A MEAL (MAKE APPT FOR FUTURE REFILLS)  . pravastatin  (PRAVACHOL) 40 MG tablet TAKE 1 TABLET BY MOUTH ONCE DAILY  . vitamin B-12 100 MCG tablet Take 1 tablet (100 mcg total) by mouth daily.   No facility-administered encounter medications on file as of 02/28/2020.    Allergies as of 02/28/2020 - Review Complete 02/28/2020  Allergen Reaction Noted  . Penicillins Shortness Of Breath 08/02/2013  . Sulfa antibiotics Shortness Of Breath 08/02/2013  . Feraheme [ferumoxytol] Nausea Only and Other (See Comments) 09/28/2016  . Lisinopril  10/16/2018    Past Medical History:  Diagnosis Date  . CIN I (cervical intraepithelial neoplasia I)   . Depression   . Diet-controlled type 2 diabetes mellitus ([place]    followed by pcp--- (08-17-2019 per pt only on occasion)  . Eczema   . GERD (gastroesophageal reflux disease)   . History of cervical dysplasia 2006   s/p LEEP  . Hyperlipidemia   . Hypertension    followed by pcp   (08-17-2019 per pt never had a stress test)  . IDA (iron deficiency anemia)   . VAIN II (vaginal intraepithelial neoplasia grade II)   . Wears glasses     Past Surgical History:  Procedure Laterality Date  . COLONOSCOPY WITH ESOPHAGOGASTRODUODENOSCOPY (EGD)  04-29-2017  dr nSilverio Decamp . GIVENS CAPSULE STUDY N/A 05/09/2017   Procedure: GIVENS CAPSULE STUDY;  Surgeon: NMauri Pole MD;  [facility];  Service: Endoscopy;  Laterality: N/A;  . LEEP  2006  . LEEP N/A 08/27/2019  Procedure: LOOP ELECTROSURGICAL EXCISION PROCEDURE (LEEP)/CERVICAL BIOPSY AND REMOVAL/ECC with colposcopy;  Surgeon: [name], MD;  Location: [facility];  Service: Gynecology;  Laterality: N/A;  CERVICAL BIOPSY AND REMOVAL/ECC  . LESION REMOVAL N/A 08/27/2019   Procedure: LASER ABLATION OF Vaginal dysplasia;  Surgeon: [name], MD;  Location: [facility];  Service: Gynecology;  Laterality: N/A;    Family History  Problem Relation Age of Onset  . Early death Mother 51       died in  sleep; uncertain cause  . Hyperlipidemia Father   . Hypertension Father   . Stroke Father 79  . CAD Father 43       CAD, quadruple bypass  . Heart disease Paternal Grandfather 46  . Crohn's disease Son   . Heart attack Maternal Grandmother   . Alzheimer's disease Maternal Grandmother   . Heart attack Maternal Grandfather   . Heart attack Paternal Grandmother   . Diabetes Paternal Grandmother   . Breast cancer Neg Hx   . Colon cancer Neg Hx     Social History   Socioeconomic History  . Marital status: Married    Spouse name: Not on file  . Number of children: Not on file  . Years of education: Not on file  . Highest education level: Not on file  Occupational History  . Not on file  Tobacco Use  . Smoking status: Never Smoker  . Smokeless tobacco: Never Used  Vaping Use  . Vaping Use: Never used  Substance and Sexual Activity  . Alcohol use: Yes    Alcohol/week: 2.0 standard drinks    Types: 2 Standard drinks or equivalent per week    Comment: per week  . Drug use: Never  . Sexual activity: Yes    Partners: Male    Birth control/protection: Post-menopausal  Other Topics Concern  . Not on file  Social History Narrative   Work or School: Publishing copy Situation: lives with boyfriend      Spiritual Beliefs: Christian      Lifestyle: exercising, trying to eat well            Social Determinants of Health   Financial Resource Strain: Not on file  Food Insecurity: Not on file  Transportation Needs: Not on file  Physical Activity: Not on file  Stress: Not on file  Social Connections: Not on file  Intimate Partner Violence: Not on file      Review of systems: All other review of systems negative except as mentioned in the HPI.   Physical Exam: Vitals:   02/28/20 0929  BP: 134/80  Pulse: (!) 57   Body mass index is 31.28 kg/m. Gen:      No acute distress Neuro: alert and oriented x 3 Psych: normal mood and  affect  Data Reviewed:  Reviewed labs, radiology imaging, old records and pertinent past GI work up   Assessment and Plan/Recommendations:  55 year old very pleasant female with history of iron deficiency anemia, erosions in terminal ileum, gastritis and GERD Overall doing well Continue to avoid NSAIDs We will plan to decrease the dose of omeprazole to 20 mg daily and and if continuing to do well, plan to slowly wean off the medication Continue antireflux measures  Follow-up CBC, CMP and iron panel annually when she does annual physical with PMD  Return in 1 year or sooner if needed  This visit required 30 minutes  of patient care (this includes precharting, chart review, review of results, face-to-face time used for counseling as well as treatment plan and follow-up. The patient was provided an opportunity to ask questions and all were answered. The patient agreed with the plan and demonstrated an understanding of the instructions.  Damaris Hippo , MD    CC: Caren Macadam, MD

## 2020-02-29 ENCOUNTER — Encounter: Payer: Self-pay | Admitting: Family Medicine

## 2020-02-29 ENCOUNTER — Ambulatory Visit: Payer: 59 | Admitting: Family Medicine

## 2020-02-29 VITALS — BP 120/80 | HR 73 | Temp 97.9°F | Ht 62.0 in | Wt 170.8 lb

## 2020-02-29 DIAGNOSIS — F325 Major depressive disorder, single episode, in full remission: Secondary | ICD-10-CM

## 2020-02-29 DIAGNOSIS — D72819 Decreased white blood cell count, unspecified: Secondary | ICD-10-CM

## 2020-02-29 DIAGNOSIS — E785 Hyperlipidemia, unspecified: Secondary | ICD-10-CM

## 2020-02-29 DIAGNOSIS — E538 Deficiency of other specified B group vitamins: Secondary | ICD-10-CM

## 2020-02-29 DIAGNOSIS — E611 Iron deficiency: Secondary | ICD-10-CM

## 2020-02-29 DIAGNOSIS — R739 Hyperglycemia, unspecified: Secondary | ICD-10-CM | POA: Diagnosis not present

## 2020-02-29 DIAGNOSIS — I10 Essential (primary) hypertension: Secondary | ICD-10-CM

## 2020-02-29 LAB — CBC WITH DIFFERENTIAL/PLATELET
Basophils Absolute: 0 10*3/uL (ref 0.0–0.1)
Basophils Relative: 0.8 % (ref 0.0–3.0)
Eosinophils Absolute: 0.2 10*3/uL (ref 0.0–0.7)
Eosinophils Relative: 4.4 % (ref 0.0–5.0)
HCT: 38 % (ref 36.0–46.0)
Hemoglobin: 12.6 g/dL (ref 12.0–15.0)
Lymphocytes Relative: 9.6 % — ABNORMAL LOW (ref 12.0–46.0)
Lymphs Abs: 0.4 10*3/uL — ABNORMAL LOW (ref 0.7–4.0)
MCHC: 33.2 g/dL (ref 30.0–36.0)
MCV: 82.6 fl (ref 78.0–100.0)
Monocytes Absolute: 0.3 10*3/uL (ref 0.1–1.0)
Monocytes Relative: 7.8 % (ref 3.0–12.0)
Neutro Abs: 3 10*3/uL (ref 1.4–7.7)
Neutrophils Relative %: 77.4 % — ABNORMAL HIGH (ref 43.0–77.0)
Platelets: 242 10*3/uL (ref 150.0–400.0)
RBC: 4.6 Mil/uL (ref 3.87–5.11)
RDW: 14.4 % (ref 11.5–15.5)
WBC: 3.9 10*3/uL — ABNORMAL LOW (ref 4.0–10.5)

## 2020-02-29 LAB — VITAMIN B12: Vitamin B-12: 411 pg/mL (ref 211–911)

## 2020-02-29 LAB — COMPREHENSIVE METABOLIC PANEL
ALT: 39 U/L — ABNORMAL HIGH (ref 0–35)
AST: 23 U/L (ref 0–37)
Albumin: 4.6 g/dL (ref 3.5–5.2)
Alkaline Phosphatase: 110 U/L (ref 39–117)
BUN: 19 mg/dL (ref 6–23)
CO2: 29 mEq/L (ref 19–32)
Calcium: 10 mg/dL (ref 8.4–10.5)
Chloride: 103 mEq/L (ref 96–112)
Creatinine, Ser: 0.94 mg/dL (ref 0.40–1.20)
GFR: 67.57 mL/min (ref >60.00)
Glucose, Bld: 97 mg/dL (ref 70–99)
Potassium: 5.1 mEq/L (ref 3.5–5.1)
Sodium: 139 mEq/L (ref 135–145)
Total Bilirubin: 0.5 mg/dL (ref 0.2–1.2)
Total Protein: 7.1 g/dL (ref 6.0–8.3)

## 2020-02-29 LAB — LIPID PANEL
Cholesterol: 226 mg/dL — ABNORMAL HIGH (ref 0–200)
HDL: 65.7 mg/dL (ref >39.00)
LDL Cholesterol: 132 mg/dL — ABNORMAL HIGH (ref 0–99)
NonHDL: 160.34
Total CHOL/HDL Ratio: 3
Triglycerides: 140 mg/dL (ref 0.0–149.0)
VLDL: 28 mg/dL (ref 0.0–40.0)

## 2020-02-29 LAB — IRON: Iron: 83 ug/dL (ref 42–145)

## 2020-02-29 LAB — HEMOGLOBIN A1C: Hgb A1c MFr Bld: 6.5 % (ref 4.6–6.5)

## 2020-02-29 LAB — FERRITIN: Ferritin: 18.2 ng/mL (ref 10.0–291.0)

## 2020-02-29 NOTE — Progress Notes (Signed)
Rachael Hodge DOB: [birth year] Encounter date: 02/29/2020  This is a 55 y.o. female who presents with Chief Complaint  Patient presents with  . Follow-up  . Results    Patient informed of the results from August-stated she has been taking Pravastatin 6m daily, not aware to take 828m   History of present illness: Doing pretty well.   Saw GI yesterday - says that visit went well. Both her children have crohns but her symptoms have been controlled. Not taking anything for this. Doing well with bowels.   Elevated fasting glucose - cut out carbs; just working on healthy eating. She has been doing walking/eliptical; yoga/pilates.   She is working from home 100% now so tries to schedule breaks.   HTN: hasn't been checking at home. Can tell when high, but hasn't been. Losartan 5020maily.   HL: has been taking pravastatin 56m25mily.   Mood is good - changed her role at work.   Allergies  Allergen Reactions  . Penicillins Shortness Of Breath  . Sulfa Antibiotics Shortness Of Breath  . Feraheme [Ferumoxytol] Nausea Only and Other (See Comments)    Hypotension and low heart rate  . Lisinopril     cough   Current Meds  Medication Sig  . Estradiol 10 MCG TABS vaginal tablet Place one tablet vaginally qhs x one week, then space out to 2 x a week.  . ferrous sulfate 325 (65 FE) MG tablet Take 1 tablet (325 mg total) by mouth daily with breakfast.  . FLUoxetine (PROZAC) 40 MG capsule TAKE 1 CAPSULE BY MOUTH ONCE DAILY  . losartan (COZAAR) 50 MG tablet TAKE 1 TABLET BY MOUTH ONCE DAILY  . omeprazole (PRILOSEC) 20 MG capsule Take 1 capsule (20 mg total) by mouth daily.  . pravastatin (PRAVACHOL) 40 MG tablet TAKE 1 TABLET BY MOUTH ONCE DAILY  . vitamin B-12 100 MCG tablet Take 1 tablet (100 mcg total) by mouth daily.    Review of Systems  Constitutional: Negative for chills, fatigue and fever.  Respiratory: Negative for cough, chest tightness, shortness of breath and wheezing.    Cardiovascular: Negative for chest pain, palpitations and leg swelling.    Objective:  BP 120/80 (BP Location: Left Arm, Patient Position: Sitting, Cuff Size: Large)   Pulse 73   Temp 97.9 F (36.6 C) (Oral)   Ht 5' 2"  (1.575 m)   Wt 170 lb 12.8 oz (77.5 kg)   LMP 12/02/2012   BMI 31.24 kg/m   Weight: 170 lb 12.8 oz (77.5 kg)   BP Readings from Last 3 Encounters:  02/29/20 120/80  02/28/20 134/80  09/24/19 116/70   Wt Readings from Last 3 Encounters:  02/29/20 170 lb 12.8 oz (77.5 kg)  02/28/20 171 lb (77.6 kg)  09/24/19 160 lb 12.8 oz (72.9 kg)    Physical Exam Constitutional:      General: She is not in acute distress.    Appearance: She is well-developed.  Cardiovascular:     Rate and Rhythm: Normal rate and regular rhythm.     Heart sounds: Normal heart sounds. No murmur heard. No friction rub.  Pulmonary:     Effort: Pulmonary effort is normal. No respiratory distress.     Breath sounds: Normal breath sounds. No wheezing or rales.  Musculoskeletal:     Right lower leg: No edema.     Left lower leg: No edema.  Neurological:     Mental Status: She is alert and oriented to person, place,  and time.  Psychiatric:        Behavior: Behavior normal.     Assessment/Plan  1. Essential hypertension Well controlled; continue losartan 3m daily. - CBC with Differential/Platelet; Future - Comprehensive metabolic panel; Future  2. Hyperlipidemia, unspecified hyperlipidemia type Will recheck. She has been taking the pravastatin 472mdaily. - Lipid panel; Future  3. Major depressive disorder with single episode, in full remission ([place] has been well controlled on prozac 4090m  4. Low serum vitamin B12 Will recheck today. - Vitamin B12; Future  5. Hyperglycemia She is motivated to keep working on exercise and healthy eating. - Hemoglobin A1c; Future  6. Iron deficiency - Ferritin; Future - Iron; Future   Return in about 6 months (around 08/28/2020)  for physical exam.    JunMicheline RoughD

## 2020-02-29 NOTE — Addendum Note (Signed)
Addended by: Marrion Coy on: 02/29/2020 09:10 AM   Modules accepted: Orders

## 2020-03-01 ENCOUNTER — Other Ambulatory Visit: Payer: Self-pay | Admitting: Family Medicine

## 2020-03-03 ENCOUNTER — Other Ambulatory Visit: Payer: Self-pay | Admitting: Family Medicine

## 2020-03-03 ENCOUNTER — Encounter: Payer: Self-pay | Admitting: Family Medicine

## 2020-03-03 MED FILL — FLUoxetine HCL 40 MG CAPS: 40 | 90 days supply | Qty: 90 | Fill #0

## 2020-03-05 ENCOUNTER — Other Ambulatory Visit: Payer: Self-pay | Admitting: Family Medicine

## 2020-03-05 MED ORDER — PRAVASTATIN SODIUM 80 MG PO TABS
80.0000 mg | ORAL_TABLET | Freq: Every day | ORAL | 1 refills | Status: DC
Start: 2020-03-05 — End: 2020-03-05

## 2020-03-05 MED FILL — PRAVASTATIN SODIUM 80 MG TA: 80 | 90 days supply | Qty: 90 | Fill #0

## 2020-03-05 NOTE — Addendum Note (Signed)
Addended by: Agnes Lawrence on: 03/05/2020 02:35 PM   Modules accepted: Orders

## 2020-03-11 DIAGNOSIS — E119 Type 2 diabetes mellitus without complications: Secondary | ICD-10-CM | POA: Diagnosis not present

## 2020-03-13 NOTE — Addendum Note (Signed)
Addended by: Agnes Lawrence on: 03/13/2020 04:53 PM   Modules accepted: Orders

## 2020-03-14 NOTE — Addendum Note (Signed)
Addended by: Marrion Coy on: 03/14/2020 02:27 PM   Modules accepted: Orders

## 2020-03-14 NOTE — Addendum Note (Signed)
Addended by: Marrion Coy on: 03/14/2020 02:32 PM   Modules accepted: Orders

## 2020-03-20 ENCOUNTER — Encounter: Payer: Self-pay | Admitting: Gastroenterology

## 2020-04-03 ENCOUNTER — Other Ambulatory Visit (INDEPENDENT_AMBULATORY_CARE_PROVIDER_SITE_OTHER): Payer: 59

## 2020-04-03 ENCOUNTER — Other Ambulatory Visit: Payer: Self-pay

## 2020-04-03 DIAGNOSIS — D72819 Decreased white blood cell count, unspecified: Secondary | ICD-10-CM | POA: Diagnosis not present

## 2020-04-03 LAB — CBC WITH DIFFERENTIAL/PLATELET
Basophils Absolute: 0 10*3/uL (ref 0.0–0.1)
Basophils Relative: 0.7 % (ref 0.0–3.0)
Eosinophils Absolute: 0.2 10*3/uL (ref 0.0–0.7)
Eosinophils Relative: 4.7 % (ref 0.0–5.0)
HCT: 37 % (ref 36.0–46.0)
Hemoglobin: 12.5 g/dL (ref 12.0–15.0)
Lymphocytes Relative: 11.8 % — ABNORMAL LOW (ref 12.0–46.0)
Lymphs Abs: 0.5 10*3/uL — ABNORMAL LOW (ref 0.7–4.0)
MCHC: 33.8 g/dL (ref 30.0–36.0)
MCV: 82.4 fl (ref 78.0–100.0)
Monocytes Absolute: 0.3 10*3/uL (ref 0.1–1.0)
Monocytes Relative: 8 % (ref 3.0–12.0)
Neutro Abs: 3.1 10*3/uL (ref 1.4–7.7)
Neutrophils Relative %: 74.8 % (ref 43.0–77.0)
Platelets: 246 10*3/uL (ref 150.0–400.0)
RBC: 4.49 Mil/uL (ref 3.87–5.11)
RDW: 14.4 % (ref 11.5–15.5)
WBC: 4.2 10*3/uL (ref 4.0–10.5)

## 2020-04-07 ENCOUNTER — Other Ambulatory Visit: Payer: Self-pay | Admitting: Family Medicine

## 2020-04-07 DIAGNOSIS — I10 Essential (primary) hypertension: Secondary | ICD-10-CM

## 2020-04-07 MED FILL — LOSARTAN POTASSIUM 50 MG TA: 50 | 30 days supply | Qty: 30 | Fill #0

## 2020-04-24 ENCOUNTER — Other Ambulatory Visit (HOSPITAL_BASED_OUTPATIENT_CLINIC_OR_DEPARTMENT_OTHER): Payer: Self-pay

## 2020-04-28 ENCOUNTER — Other Ambulatory Visit (HOSPITAL_COMMUNITY)
Admission: RE | Admit: 2020-04-28 | Discharge: 2020-04-28 | Disposition: A | Discharge status: Home / Self Care | Payer: 59 | Source: Ambulatory Visit | Attending: Obstetrics and Gynecology | Admitting: Obstetrics and Gynecology

## 2020-04-28 ENCOUNTER — Other Ambulatory Visit: Payer: Self-pay | Admitting: Obstetrics and Gynecology

## 2020-04-28 ENCOUNTER — Ambulatory Visit (INDEPENDENT_AMBULATORY_CARE_PROVIDER_SITE_OTHER): Payer: 59 | Admitting: Obstetrics and Gynecology

## 2020-04-28 ENCOUNTER — Other Ambulatory Visit: Payer: Self-pay

## 2020-04-28 ENCOUNTER — Encounter: Payer: Self-pay | Admitting: Obstetrics and Gynecology

## 2020-04-28 VITALS — BP 130/72 | HR 67 | Ht 62.0 in | Wt 166.0 lb

## 2020-04-28 DIAGNOSIS — Z9889 Other specified postprocedural states: Secondary | ICD-10-CM | POA: Insufficient documentation

## 2020-04-28 DIAGNOSIS — Z87411 Personal history of vaginal dysplasia: Secondary | ICD-10-CM | POA: Diagnosis not present

## 2020-04-28 DIAGNOSIS — Z1272 Encounter for screening for malignant neoplasm of vagina: Secondary | ICD-10-CM | POA: Insufficient documentation

## 2020-04-28 DIAGNOSIS — N952 Postmenopausal atrophic vaginitis: Secondary | ICD-10-CM

## 2020-04-28 DIAGNOSIS — Z124 Encounter for screening for malignant neoplasm of cervix: Secondary | ICD-10-CM | POA: Insufficient documentation

## 2020-04-28 DIAGNOSIS — Z01419 Encounter for gynecological examination (general) (routine) without abnormal findings: Secondary | ICD-10-CM

## 2020-04-28 MED ORDER — ESTRADIOL 10 MCG VA TABS: ORAL_TABLET | VAGINAL | 3 refills | Status: DC

## 2020-04-28 MED FILL — ESTRADIOL 10 MCG TABS: 10 | 63 days supply | Qty: 24 | Fill #0

## 2020-04-28 NOTE — Patient Instructions (Signed)

## 2020-04-28 NOTE — Progress Notes (Signed)
55 y.o. G1P2002 Married White or Caucasian Not Hispanic or Latino female here for annual exam. No vaginal bleeding.  In 7/21 the patient had a loop cone cervical biopsy and laser ablation of vaginal dysplasia for CIN I and VAIN II.  Leep pathology with CIN II-III, superficial portion with + margins, endocervical leep and ecc were negative for dysplasia. She was started on vaginal estrogen estrogen last August for dyspareunia even with lubrication. She hasn't been taking the estrogen consistently. Infrequently sexually active at the moment secondary to ED. Husband with cardiac issues.   Last HgbA1C was in the prediabetic range.     Patient's last menstrual period was 12/02/2012.          Sexually active: No.  The current method of family planning is post menopausal status.    Exercising: Yes.    Walking yoga Smoker:  no  Health Maintenance: Pap: 04/19/18 LSIL HR HPV +,03/25/2017 LSIL NEG HPV,02-12-16 WNL + HR HPV negative 16/18/45, 08-24-12 WNL NEG HR HPV History of abnormal Pap:  Yes 2019 and 2020 H/O LEEP 2010, 2021. Colposcopy in 4/20 unsatisfactory, ECC with CIN I, vaginal biopsy negative. MMG:  11/09/19 density B Bi-rads 1 Neg  BMD:   None  Colonoscopy: 2019  TDaP:  2015  Gardasil: NA   reports that she has never smoked. She has never used smokeless tobacco. She reports current alcohol use of about 2.0 standard drinks of alcohol per week. She reports that she does not use drugs. 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 Son's are 27and 66, local. Husband has 4 children. With her husband since 2008, married x 3 years. 2 dogs, pit bull and boxer/pit bull   Past Medical History:  Diagnosis Date  . CIN I (cervical intraepithelial neoplasia I)   . Depression   . Diet-controlled type 2 diabetes mellitus ([place])    followed by pcp--- (08-17-2019 per pt only on occasion)  . Eczema   . GERD (gastroesophageal reflux disease)   . History of cervical dysplasia 2006   s/p LEEP  .  Hyperlipidemia   . Hypertension    followed by pcp   (08-17-2019 per pt never had a stress test)  . IDA (iron deficiency anemia)   . VAIN II (vaginal intraepithelial neoplasia grade II)   . Wears glasses     Past Surgical History:  Procedure Laterality Date  . COLONOSCOPY WITH ESOPHAGOGASTRODUODENOSCOPY (EGD)  04-29-2017  dr Silverio Decamp  . GIVENS CAPSULE STUDY N/A 05/09/2017   Procedure: GIVENS CAPSULE STUDY;  Surgeon: Mauri Pole, MD;  Location: [facility];  Service: Endoscopy;  Laterality: N/A;  . LEEP  2006  . LEEP N/A 08/27/2019   Procedure: LOOP ELECTROSURGICAL EXCISION PROCEDURE (LEEP)/CERVICAL BIOPSY AND REMOVAL/ECC with colposcopy;  Surgeon: [name], MD;  Location: [facility];  Service: Gynecology;  Laterality: N/A;  CERVICAL BIOPSY AND REMOVAL/ECC  . LESION REMOVAL N/A 08/27/2019   Procedure: LASER ABLATION OF Vaginal dysplasia;  Surgeon: [name], MD;  [facility];  Service: Gynecology;  Laterality: N/A;    Current Outpatient Medications  Medication Sig Dispense Refill  . Estradiol 10 MCG TABS vaginal tablet Place one tablet vaginally qhs x one week, then space out to 2 x a week. 24 tablet 2  . ferrous sulfate 325 (65 FE) MG tablet Take 1 tablet (325 mg total) by mouth daily with breakfast.  3  . FLUoxetine (PROZAC) 40 MG capsule TAKE 1 CAPSULE BY MOUTH ONCE DAILY 90  capsule 1  . losartan (COZAAR) 50 MG tablet TAKE 1 TABLET BY MOUTH ONCE DAILY 90 tablet 1  . omeprazole (PRILOSEC) 20 MG capsule Take 1 capsule (20 mg total) by mouth daily. 90 capsule 3  . pravastatin (PRAVACHOL) 80 MG tablet Take 1 tablet (80 mg total) by mouth daily. 90 tablet 1  . vitamin B-12 100 MCG tablet Take 1 tablet (100 mcg total) by mouth daily.     No current facility-administered medications for this visit.    Family History  Problem Relation Age of Onset  . Early death Mother 49       died in sleep; uncertain cause  .  Hyperlipidemia Father   . Hypertension Father   . Stroke Father 13  . CAD Father 21       CAD, quadruple bypass  . Heart disease Paternal Grandfather 56  . Crohn's disease Son   . Heart attack Maternal Grandmother   . Alzheimer's disease Maternal Grandmother   . Heart attack Maternal Grandfather   . Heart attack Paternal Grandmother   . Diabetes Paternal Grandmother   . Breast cancer Neg Hx   . Colon cancer Neg Hx     Review of Systems  All other systems reviewed and are negative.   Exam:   BP 130/72   Pulse 67   Ht 5' 2"  (1.575 m)   Wt 166 lb (75.3 kg)   LMP 12/02/2012   SpO2 100%   BMI 30.36 kg/m   Weight change: @WEIGHTCHANGE @ Height:   Height: 5' 2"  (157.5 cm)  Ht Readings from Last 3 Encounters:  04/28/20 5' 2"  (1.575 m)  02/29/20 5' 2"  (1.575 m)  02/28/20 5' 2"  (1.575 m)    General appearance: alert, cooperative and appears stated age Head: Normocephalic, without obvious abnormality, atraumatic Neck: no adenopathy, supple, symmetrical, trachea midline and thyroid normal to inspection and palpation Lungs: clear to auscultation bilaterally Cardiovascular: regular rate and rhythm Breasts: normal appearance, no masses or tenderness Abdomen: soft, non-tender; non distended,  no masses,  no organomegaly Extremities: extremities normal, atraumatic, no cyanosis or edema Skin: Skin color, texture, turgor normal. No rashes or lesions Lymph nodes: Cervical, supraclavicular, and axillary nodes normal. No abnormal inguinal nodes palpated Neurologic: Grossly normal   Pelvic: External genitalia:  no lesions              Urethra:  normal appearing urethra with no masses, tenderness or lesions              Bartholins and Skenes: normal                 Vagina: normal appearing vagina with normal color and discharge, no lesions              Cervix: flush with vagina. Stenotic os seen (only visible portion of her cervix). Pap done, unable to to get a cytobrush into the cervix.                 Bimanual Exam:  Uterus:  normal size, contour, position, consistency, mobility, non-tender              Adnexa: no mass, fullness, tenderness               Rectovaginal: Confirms               Anus:  normal sphincter tone, no lesions  Gae Dry chaperoned for the exam.  1. Well woman exam Pap with hpv of cervix and  vagina Labs with primary Discussed breast self exam Discussed calcium and vit D intake Mammogram UTD Colonoscopy is UTD  2. Screening for cervical cancer  - Cytology - PAP  3. Screening for vaginal cancer  - Cytology - PAP  4. History of loop electrical excision procedure (LEEP)  - Cytology - PAP  5. History of vaginal dysplasia  - Cytology - PAP

## 2020-04-30 LAB — CYTOLOGY - PAP
Comment: NEGATIVE
Diagnosis: NEGATIVE
High risk HPV: NEGATIVE

## 2020-05-01 ENCOUNTER — Ambulatory Visit: Payer: 59 | Admitting: Obstetrics and Gynecology

## 2020-05-03 ENCOUNTER — Other Ambulatory Visit (HOSPITAL_COMMUNITY): Payer: Self-pay

## 2020-05-03 MED FILL — Losartan Potassium Tab 50 MG: ORAL | 90 days supply | Qty: 90 | Fill #0 | Status: AC

## 2020-05-04 ENCOUNTER — Other Ambulatory Visit (HOSPITAL_COMMUNITY): Payer: Self-pay

## 2020-05-05 ENCOUNTER — Other Ambulatory Visit (HOSPITAL_COMMUNITY): Payer: Self-pay

## 2020-05-26 ENCOUNTER — Other Ambulatory Visit (HOSPITAL_COMMUNITY): Payer: Self-pay

## 2020-05-26 MED FILL — Pravastatin Sodium Tab 80 MG: ORAL | 90 days supply | Qty: 90 | Fill #0 | Status: AC

## 2020-05-26 MED FILL — Fluoxetine HCl Cap 40 MG: ORAL | 90 days supply | Qty: 90 | Fill #0 | Status: AC

## 2020-05-28 ENCOUNTER — Other Ambulatory Visit (HOSPITAL_COMMUNITY): Payer: Self-pay

## 2020-06-03 ENCOUNTER — Other Ambulatory Visit: Payer: Self-pay

## 2020-06-03 ENCOUNTER — Other Ambulatory Visit (INDEPENDENT_AMBULATORY_CARE_PROVIDER_SITE_OTHER): Payer: 59

## 2020-06-03 DIAGNOSIS — I10 Essential (primary) hypertension: Secondary | ICD-10-CM | POA: Diagnosis not present

## 2020-06-03 DIAGNOSIS — R739 Hyperglycemia, unspecified: Secondary | ICD-10-CM

## 2020-06-03 DIAGNOSIS — E785 Hyperlipidemia, unspecified: Secondary | ICD-10-CM | POA: Diagnosis not present

## 2020-06-03 LAB — LIPID PANEL
Cholesterol: 235 mg/dL — ABNORMAL HIGH (ref 0–200)
HDL: 67.7 mg/dL (ref >39.00)
LDL Cholesterol: 144 mg/dL — ABNORMAL HIGH (ref 0–99)
NonHDL: 167.43
Total CHOL/HDL Ratio: 3
Triglycerides: 118 mg/dL (ref 0.0–149.0)
VLDL: 23.6 mg/dL (ref 0.0–40.0)

## 2020-06-03 LAB — HEMOGLOBIN A1C: Hgb A1c MFr Bld: 6.5 % (ref 4.6–6.5)

## 2020-06-04 ENCOUNTER — Encounter: Payer: Self-pay | Admitting: Family Medicine

## 2020-06-04 DIAGNOSIS — I10 Essential (primary) hypertension: Secondary | ICD-10-CM

## 2020-06-04 DIAGNOSIS — Z823 Family history of stroke: Secondary | ICD-10-CM

## 2020-06-04 DIAGNOSIS — Z8249 Family history of ischemic heart disease and other diseases of the circulatory system: Secondary | ICD-10-CM

## 2020-06-04 DIAGNOSIS — E785 Hyperlipidemia, unspecified: Secondary | ICD-10-CM

## 2020-08-08 ENCOUNTER — Other Ambulatory Visit (HOSPITAL_COMMUNITY): Payer: Self-pay

## 2020-08-10 ENCOUNTER — Other Ambulatory Visit: Payer: Self-pay | Admitting: Family Medicine

## 2020-08-10 MED FILL — Losartan Potassium Tab 50 MG: ORAL | 60 days supply | Qty: 60 | Fill #1 | Status: AC

## 2020-08-10 MED FILL — Omeprazole Cap Delayed Release 20 MG: ORAL | 90 days supply | Qty: 90 | Fill #0 | Status: AC

## 2020-08-11 ENCOUNTER — Other Ambulatory Visit (HOSPITAL_COMMUNITY): Payer: Self-pay

## 2020-08-11 MED ORDER — PRAVASTATIN SODIUM 80 MG PO TABS
ORAL_TABLET | Freq: Every day | ORAL | 1 refills | Status: DC
  Filled 2020-08-11: qty 90, 90d supply, fill #0

## 2020-08-11 MED ORDER — FLUOXETINE HCL 40 MG PO CAPS
ORAL_CAPSULE | Freq: Every day | ORAL | 1 refills | Status: DC
  Filled 2020-08-11: qty 90, 90d supply, fill #0
  Filled 2020-12-01: qty 90, 90d supply, fill #1

## 2020-08-12 ENCOUNTER — Other Ambulatory Visit (HOSPITAL_COMMUNITY): Payer: Self-pay

## 2020-09-09 ENCOUNTER — Ambulatory Visit: Payer: 59 | Admitting: Cardiology

## 2020-09-11 NOTE — Progress Notes (Signed)
Cardiology Office Note   Date:  09/17/2020   ID:  Rachael Hodge, DOB July 26, 1965, MRN [id number]  PCP:  Rachael Macadam, MD  Cardiologist:   Rachael Fouch [place], MD   No chief complaint on file.    History of Present Illness: Rachael Hodge is a 55 y.o. female who is seen at the request of Dr Ethlyn Gallery for assessment of cardiovascular risk. She has a history of DM type 2- diet controlled, HTN, and HLD. She has a family history of CAD with father having CABG at age 37.  She denies any symptoms of dyspnea, palpitations, chest pain or fatigue. Admits weight has gone up. Is sedentary. Works from home. Has 2 grown sons. Did not tolerate metformin due to GI upset. States she was on lipitor in the past and doesn't know why this was changed. Remote history of GI bleed.    Past Medical History:  Diagnosis Date   CIN I (cervical intraepithelial neoplasia I)    Depression    Diet-controlled type 2 diabetes mellitus ([place])    followed by pcp--- (08-17-2019 per pt only on occasion)   Eczema    GERD (gastroesophageal reflux disease)    GI bleed    History of cervical dysplasia 2006   s/p LEEP   Hyperlipidemia    Hypertension    followed by pcp   (08-17-2019 per pt never had a stress test)   IDA (iron deficiency anemia)    VAIN II (vaginal intraepithelial neoplasia grade II)    Wears glasses     Past Surgical History:  Procedure Laterality Date   COLONOSCOPY WITH ESOPHAGOGASTRODUODENOSCOPY (EGD)  04-29-2017  dr Silverio Decamp   GIVENS CAPSULE STUDY N/A 05/09/2017   Procedure: GIVENS CAPSULE STUDY;  Surgeon: Rachael Pole, MD;  Location: [facility];  Service: Endoscopy;  Laterality: N/A;   LEEP  2006   LEEP N/A 08/27/2019   Procedure: LOOP ELECTROSURGICAL EXCISION PROCEDURE (LEEP)/CERVICAL BIOPSY AND REMOVAL/ECC with colposcopy;  Surgeon: [name], MD;  Location: [facility];  Service: Gynecology;  Laterality: N/A;  CERVICAL BIOPSY AND REMOVAL/ECC    LESION REMOVAL N/A 08/27/2019   Procedure: LASER ABLATION OF Vaginal dysplasia;  Surgeon: [name], MD;  Location: [facility];  Service: Gynecology;  Laterality: N/A;     Current Outpatient Medications  Medication Sig Dispense Refill   Estradiol 10 MCG TABS vaginal tablet PLACE ONE TABLET VAGINALLY EVERY NIGHT AT BEDTIME FOR 1 WEEK, THEN SWITCH TO TWICE WEEKLY. 24 tablet 3   ferrous sulfate 325 (65 FE) MG tablet Take 1 tablet (325 mg total) by mouth daily with breakfast.  3   FLUoxetine (PROZAC) 40 MG capsule Take 1 capsule by mouth daily as directed 90 capsule 1   losartan (COZAAR) 50 MG tablet TAKE 1 TABLET BY MOUTH ONCE DAILY 90 tablet 1   omeprazole (PRILOSEC) 20 MG capsule TAKE 1 CAPSULE BY MOUTH DAILY. 90 capsule 3   vitamin B-12 100 MCG tablet Take 1 tablet (100 mcg total) by mouth daily.     No current facility-administered medications for this visit.    Allergies:   Penicillins, Sulfa antibiotics, Feraheme [ferumoxytol], and Lisinopril    Social History:  The patient  reports that she has never smoked. She has never used smokeless tobacco. She reports current alcohol use of about 2.0 standard drinks per week. She reports that she does not use drugs.   Family History:  The patient's family history includes Alzheimer's disease  in her maternal grandmother; CAD (age of onset: 17) in her father; Crohn's disease in her son; Diabetes in her paternal grandmother; Early death (age of onset: 3) in her mother; Heart attack in her maternal grandfather, maternal grandmother, and paternal grandmother; Heart disease (age of onset: 80) in her paternal grandfather; Heart disease (age of onset: 61) in her father; Hyperlipidemia in her father; Hypertension in her father; Stroke (age of onset: 24) in her father.    ROS:  Please see the history of present illness.   Otherwise, review of systems are positive for none.   All other systems are reviewed and negative.     PHYSICAL EXAM: VS:  BP (!) 158/82   Pulse 65   Ht 5' 2"  (1.575 m)   Wt 166 lb 4.8 oz (75.4 kg)   LMP 12/02/2012   SpO2 97%   BMI 30.42 kg/m  , BMI Body mass index is 30.42 kg/m. GEN: Well nourished, well developed, in no acute distress HEENT: normal Neck: no JVD, carotid bruits, or masses Cardiac: RRR; no murmurs, rubs, or gallops,no edema  Respiratory:  clear to auscultation bilaterally, normal work of breathing GI: soft, nontender, nondistended, + BS MS: no deformity or atrophy Skin: warm and dry, no rash Neuro:  Strength and sensation are intact Psych: euthymic mood, full affect   EKG:  EKG is ordered today. The ekg ordered today demonstrates NSR rate 65. Normal Ecg. I have personally reviewed and interpreted this study.    Recent Labs: 02/29/2020: ALT 39; BUN 19; Creatinine, Ser 0.94; Potassium 5.1; Sodium 139 04/03/2020: Hemoglobin 12.5; Platelets 246.0   02/29/20: A1c 6.5%  Lipid Panel    Component Value Date/Time   CHOL 235 (H) 06/03/2020 0705   TRIG 118.0 06/03/2020 0705   HDL 67.70 06/03/2020 0705   CHOLHDL 3 06/03/2020 0705   VLDL 23.6 06/03/2020 0705   LDLCALC 144 (H) 06/03/2020 0705   LDLCALC 141 (H) 09/28/2019 0742      Wt Readings from Last 3 Encounters:  09/17/20 166 lb 4.8 oz (75.4 kg)  04/28/20 166 lb (75.3 kg)  02/29/20 170 lb 12.8 oz (77.5 kg)      Other studies Reviewed: Additional studies/ records that were reviewed today include: none . Review of the above records demonstrates: N/A   ASSESSMENT AND PLAN:  1.  Hypercholesterolemia. Given DM and other risk factors would ideally like to see LDL < 70. Will switch pravastatin to Crestor 40 mg daily. Plan to repeat lab in 3 months. 2. DM type 2. Per primary care. Stressed importance of lifestyle modification with diet, weight loss and regular aerobic exercise.  3. HTN 4. Family history of CAD  To further stratify her coronary risk will arrange for a coronary CT calcium score.     Current medicines are reviewed at length with the patient today.  The patient does not have concerns regarding medicines.  The following changes have been made:  see above  Labs/ tests ordered today include: Coronary CT calcium score.    Disposition:   FU with me in 3 months  Signed, Owens Hara Martinique, MD  09/17/2020 10:25 AM    [facility] [address], [place], [place], [address] Phone [phone], Fax [phone]

## 2020-09-17 ENCOUNTER — Ambulatory Visit: Payer: 59 | Admitting: Cardiology

## 2020-09-17 ENCOUNTER — Other Ambulatory Visit (HOSPITAL_COMMUNITY): Payer: Self-pay

## 2020-09-17 ENCOUNTER — Encounter: Payer: Self-pay | Admitting: Cardiology

## 2020-09-17 ENCOUNTER — Other Ambulatory Visit: Payer: Self-pay

## 2020-09-17 VITALS — BP 158/82 | HR 65 | Ht 62.0 in | Wt 166.3 lb

## 2020-09-17 DIAGNOSIS — E119 Type 2 diabetes mellitus without complications: Secondary | ICD-10-CM | POA: Diagnosis not present

## 2020-09-17 DIAGNOSIS — E7800 Pure hypercholesterolemia, unspecified: Secondary | ICD-10-CM | POA: Diagnosis not present

## 2020-09-17 DIAGNOSIS — I10 Essential (primary) hypertension: Secondary | ICD-10-CM

## 2020-09-17 MED ORDER — ROSUVASTATIN CALCIUM 40 MG PO TABS
40.0000 mg | ORAL_TABLET | Freq: Every day | ORAL | 3 refills | Status: DC
  Filled 2020-09-17: qty 90, 90d supply, fill #0
  Filled 2020-12-01: qty 90, 90d supply, fill #1

## 2020-09-17 NOTE — Patient Instructions (Signed)
Stop Pravastatin  Start Crestor 40 mg daily  Will repeat lab work in 3 months  Will arrange for a coronary calcium score.  Start working on increasing aerobic exercise

## 2020-09-17 NOTE — Addendum Note (Signed)
Addended by: Kathyrn Lass on: 09/17/2020 10:28 AM   Modules accepted: Orders

## 2020-09-25 ENCOUNTER — Other Ambulatory Visit: Payer: Self-pay

## 2020-09-25 ENCOUNTER — Ambulatory Visit (INDEPENDENT_AMBULATORY_CARE_PROVIDER_SITE_OTHER)
Admission: RE | Admit: 2020-09-25 | Discharge: 2020-09-25 | Disposition: A | Discharge status: Home / Self Care | Payer: Self-pay | Source: Ambulatory Visit | Attending: Cardiology | Admitting: Cardiology

## 2020-09-25 DIAGNOSIS — I10 Essential (primary) hypertension: Secondary | ICD-10-CM

## 2020-09-26 NOTE — Telephone Encounter (Signed)
Already spoke to patient.See cardiac calcium score results.

## 2020-10-13 ENCOUNTER — Other Ambulatory Visit: Payer: Self-pay | Admitting: Family Medicine

## 2020-10-13 ENCOUNTER — Other Ambulatory Visit (HOSPITAL_COMMUNITY): Payer: Self-pay

## 2020-10-13 DIAGNOSIS — I10 Essential (primary) hypertension: Secondary | ICD-10-CM

## 2020-10-13 MED ORDER — LOSARTAN POTASSIUM 50 MG PO TABS
ORAL_TABLET | Freq: Every day | ORAL | 1 refills | Status: DC
  Filled 2020-10-13: qty 90, 90d supply, fill #0
  Filled 2021-01-09: qty 90, 90d supply, fill #1

## 2020-10-28 ENCOUNTER — Other Ambulatory Visit (HOSPITAL_COMMUNITY): Payer: Self-pay

## 2020-10-28 ENCOUNTER — Other Ambulatory Visit: Payer: Self-pay

## 2020-10-28 DIAGNOSIS — K2210 Ulcer of esophagus without bleeding: Secondary | ICD-10-CM

## 2020-10-28 MED ORDER — OMEPRAZOLE 40 MG PO CPDR
40.0000 mg | DELAYED_RELEASE_CAPSULE | Freq: Every day | ORAL | 3 refills | Status: DC
  Filled 2020-10-28: qty 30, 30d supply, fill #0
  Filled 2020-12-01: qty 30, 30d supply, fill #1

## 2020-10-28 NOTE — Telephone Encounter (Signed)
Okay to switch the prescription to omeprazole 40 mg daily X3 refills.  Please schedule follow-up office visit next available appointment.  Thank you

## 2020-10-29 ENCOUNTER — Ambulatory Visit (INDEPENDENT_AMBULATORY_CARE_PROVIDER_SITE_OTHER): Payer: 59 | Admitting: Family Medicine

## 2020-10-29 ENCOUNTER — Other Ambulatory Visit: Payer: Self-pay

## 2020-10-29 ENCOUNTER — Encounter: Payer: Self-pay | Admitting: Family Medicine

## 2020-10-29 VITALS — BP 112/74 | HR 69 | Temp 98.4°F | Ht 63.25 in | Wt 162.0 lb

## 2020-10-29 DIAGNOSIS — I10 Essential (primary) hypertension: Secondary | ICD-10-CM | POA: Diagnosis not present

## 2020-10-29 DIAGNOSIS — F325 Major depressive disorder, single episode, in full remission: Secondary | ICD-10-CM

## 2020-10-29 DIAGNOSIS — E119 Type 2 diabetes mellitus without complications: Secondary | ICD-10-CM | POA: Diagnosis not present

## 2020-10-29 DIAGNOSIS — E7800 Pure hypercholesterolemia, unspecified: Secondary | ICD-10-CM | POA: Diagnosis not present

## 2020-10-29 DIAGNOSIS — Z0000 Encounter for general adult medical examination without abnormal findings: Secondary | ICD-10-CM

## 2020-10-29 NOTE — Progress Notes (Addendum)
Rachael Hodge DOB: 1966/01/22 Encounter date: 10/29/2020  This is a 55 y.o. female who presents for complete physical   History of present illness/Additional concerns: Last visit with me was January 2022.  At that time she was working on healthier eating and regular exercise.  Mood was doing really well due to change in career which helped with overall stress level. She has joined [employer].   Hypertension: Losartan 50 mg daily. Does check at home - states that at home she gets higher reads like 505'L systolic.  Hyperlipidemia: changed to crestor 69m and tolerating this well.   GERD: acid reflux issues are still present. Tried to decrease omeprazole to 219m but this wasn't enough. She is trying to not snack and drink more water. States that she has always self treated with eating something when it happens. Follows with GI regularly; has appointment coming up. Last colonoscopy 04/2017 and suggested repeat in 5 years.   Follows with Dr. JeTalbert Nanor gynecology. She is up to date with everything. Mammogram not scheduled yet but due next month.   Follows regularly with dentist.   Past Medical History:  Diagnosis Date   CIN I (cervical intraepithelial neoplasia I)    Depression    [place]iet[place]   followed by pcp--- (08-17-2019 per pt only on occasion)   Eczema    GERD (gastroesophageal reflux disease)    GI bleed    History of cervical dysplasia 2006   s/p LEEP   Hyperlipidemia    Hypertension    followed by pcp   (08-17-2019 per pt never had a stress test)   IDA (iron deficiency anemia)    VAIN II (vaginal intraepithelial neoplasia grade II)    Wears glasses    Past Surgical History:  Procedure Laterality Date   COLONOSCOPY WITH ESOPHAGOGASTRODUODENOSCOPY (EGD)  04-29-2017  dr naSilverio Decamp GIVENS CAPSULE STUDY N/A 05/09/2017   Procedure: GIVENS CAPSULE STUDY;  Surgeon: NaMauri PoleMD;  [facility];  Service: Endoscopy;   Laterality: N/A;   LEEP  2006   LEEP N/A 08/27/2019   Procedure: LOOP ELECTROSURGICAL EXCISION PROCEDURE (LEEP)/CERVICAL BIOPSY AND REMOVAL/ECC with colposcopy;  Surgeon: JeSalvadore DomMD;  [facility] Service: [facility];  Laterality: N/A;  CERVICAL BIOPSY AND REMOVAL/ECC   LESION REMOVAL N/A 08/27/2019   Procedure: LASER ABLATION OF Vaginal dysplasia;  Surgeon: JeSalvadore DomMD;  [facility];  Laterality: N/A;   Allergies  Allergen Reactions   Penicillins Shortness Of Breath   Sulfa Antibiotics Shortness Of Breath   Feraheme [Ferumoxytol] Nausea Only and Other (See Comments)    Hypotension and low heart rate   Lisinopril     cough   No outpatient medications have been marked as taking for the 10/29/20 encounter (Office Visit) with KoCaren MacadamMD.   Social History   Tobacco Use   Smoking status: Never   Smokeless tobacco: Never  Substance Use Topics   Alcohol use: Yes    Alcohol/week: 2.0 standard drinks    Types: 2 Standard drinks or equivalent per week    Comment: per week   Family History  Problem Relation Age of Onset   Early death Mother [id number]     died in sleep; uncertain cause   Heart disease Father 6432     CABG   Hyperlipidemia Father    Hypertension Father    Stroke  Father 61   CAD Father 84       CAD, quadruple bypass   Heart attack Maternal Grandmother    Alzheimer's disease Maternal Grandmother    Heart attack Maternal Grandfather    Heart attack Paternal Grandmother    Diabetes Paternal Grandmother    Heart disease Paternal Grandfather 49   Crohn's disease Son    Breast cancer Neg Hx    Colon cancer Neg Hx      Review of Systems  Constitutional:  Negative for activity change, appetite change, chills, fatigue, fever and unexpected weight change.  HENT:  Negative for congestion, ear pain, hearing loss (mild decrease L>R), sinus pressure, sinus pain, sore  throat and trouble swallowing.   Eyes:  Negative for pain and visual disturbance.  Respiratory:  Negative for cough, chest tightness, shortness of breath and wheezing.   Cardiovascular:  Negative for chest pain, palpitations and leg swelling.  Gastrointestinal:  Negative for abdominal pain, blood in stool, constipation, diarrhea, nausea and vomiting.  Genitourinary:  Negative for difficulty urinating and menstrual problem.  Musculoskeletal:  Negative for arthralgias (bilat knee pain; on and off. not bad enough to take anthing for this) and back pain.  Skin:  Negative for rash.  Neurological:  Negative for dizziness, weakness, numbness and headaches.  Hematological:  Negative for adenopathy. Does not bruise/bleed easily.  Psychiatric/Behavioral:  Negative for sleep disturbance and suicidal ideas. The patient is not nervous/anxious.    CBC:  Lab Results  Component Value Date   WBC 4.2 04/03/2020   HGB 12.5 04/03/2020   HGB 12.6 03/01/2017   HGB 12.4 10/08/2016   HCT 37.0 04/03/2020   HCT 37.7 10/08/2016   MCH 27.5 03/01/2017   MCHC 33.8 04/03/2020   RDW 14.4 04/03/2020   RDW 16.4 (H) 10/08/2016   PLT 246.0 04/03/2020   PLT 272 03/01/2017   PLT 245 10/08/2016   CMP: Lab Results  Component Value Date   NA 139 02/29/2020   NA 139 10/08/2016   K 5.1 02/29/2020   K 4.4 10/08/2016   CL 103 02/29/2020   CO2 29 02/29/2020   CO2 27 10/08/2016   ANIONGAP 8 03/01/2017   GLUCOSE 97 02/29/2020   GLUCOSE 85 10/08/2016   BUN 19 02/29/2020   BUN 13.4 10/08/2016   CREATININE 0.94 02/29/2020   CREATININE 0.97 09/28/2019   CREATININE 0.9 10/08/2016   LABGLOB 3.0 07/08/2016   GFRAA >60 03/01/2017   CALCIUM 10.0 02/29/2020   CALCIUM 9.7 10/08/2016   PROT 7.1 02/29/2020   PROT 7.2 10/08/2016   BILITOT 0.5 02/29/2020   BILITOT 0.4 03/01/2017   BILITOT 0.42 10/08/2016   ALKPHOS 110 02/29/2020   ALKPHOS 117 10/08/2016   ALT 39 (H) 02/29/2020   ALT 45 03/01/2017   ALT 34 10/08/2016    AST 23 02/29/2020   AST 29 03/01/2017   AST 30 10/08/2016   LIPID: Lab Results  Component Value Date   CHOL 235 (H) 06/03/2020   TRIG 118.0 06/03/2020   HDL 67.70 06/03/2020   LDLCALC 144 (H) 06/03/2020   LDLCALC 141 (H) 09/28/2019    Objective:  BP 112/74   Pulse 69   Temp 98.4 F (36.9 C) (Oral)   Ht 5' 3.25" (1.607 m)   Wt 162 lb (73.5 kg)   LMP 12/02/2012   SpO2 99%   BMI 28.47 kg/m   Weight: 162 lb (73.5 kg)   BP Readings from Last 3 Encounters:  10/29/20 112/74  09/17/20 (!) 158/82  04/28/20 130/72   Wt Readings from Last 3 Encounters:  10/29/20 162 lb (73.5 kg)  09/17/20 166 lb 4.8 oz (75.4 kg)  04/28/20 166 lb (75.3 kg)    Physical Exam Constitutional:      General: She is not in acute distress.    Appearance: She is well-developed.  HENT:     Head: Normocephalic and atraumatic.     Right Ear: External ear normal.     Left Ear: External ear normal.     Mouth/Throat:     Pharynx: No oropharyngeal exudate.  Eyes:     Conjunctiva/sclera: Conjunctivae normal.     Pupils: Pupils are equal, round, and reactive to light.  Neck:     Thyroid: No thyromegaly.  Cardiovascular:     Rate and Rhythm: Normal rate and regular rhythm.     Heart sounds: Normal heart sounds. No murmur heard.   No friction rub. No gallop.  Pulmonary:     Effort: Pulmonary effort is normal.     Breath sounds: Normal breath sounds.  Abdominal:     General: Bowel sounds are normal. There is no distension.     Palpations: Abdomen is soft. There is no mass.     Tenderness: There is no abdominal tenderness. There is no guarding.     Hernia: No hernia is present.  Musculoskeletal:        General: No tenderness or deformity. Normal range of motion.     Cervical back: Normal range of motion and neck supple.  Lymphadenopathy:     Cervical: No cervical adenopathy.  Skin:    General: Skin is warm and dry.     Findings: No rash.  Neurological:     Mental Status: She is alert and  oriented to person, place, and time.     Deep Tendon Reflexes: Reflexes normal.     Reflex Scores:      Tricep reflexes are 2+ on the right side and 2+ on the left side.      Bicep reflexes are 2+ on the right side and 2+ on the left side.      Brachioradialis reflexes are 2+ on the right side and 2+ on the left side.      Patellar reflexes are 2+ on the right side and 2+ on the left side. Psychiatric:        Speech: Speech normal.        Behavior: Behavior normal.        Thought Content: Thought content normal.    Assessment/Plan: Health Maintenance Due  Topic Date Due   Fecal DNA (Cologuard)  04/04/2018   COVID-19 Vaccine (4 - Booster for Pfizer series) 03/31/2020   Health Maintenance reviewed.  1. Preventative health care Keep up with exercise and healthy eating!  2. Controlled type 2 diabetes mellitus without complication, without long-term current use of insulin ([place]) Has been well controlled. We can recheck A1C at next visit.  - HM DIABETES FOOT EXAM  3. Essential hypertension Bp well controlled here today. Wondering if home cuff reads slightly higher. Encouraged her to bring cuff to cardiology f/u visit and check against their in office cuff.   4. Pure hypercholesterolemia Tolerating crestor. She has lab visit set up for recheck lipids which should be significantly better on this medication.  5. Depression:  Tolerates the prozac 78m; hasn't done well with decreases in past and does not wish to decrease dose currently.   Return in about 6 months (around 04/28/2021) for  Chronic condition visit.  Micheline Rough, MD

## 2020-11-24 ENCOUNTER — Encounter: Payer: Self-pay | Admitting: Hematology

## 2020-11-24 ENCOUNTER — Other Ambulatory Visit: Payer: Self-pay

## 2020-11-24 ENCOUNTER — Other Ambulatory Visit (HOSPITAL_BASED_OUTPATIENT_CLINIC_OR_DEPARTMENT_OTHER): Payer: Self-pay

## 2020-11-24 ENCOUNTER — Ambulatory Visit: Payer: 59 | Attending: Internal Medicine

## 2020-11-24 DIAGNOSIS — Z23 Encounter for immunization: Secondary | ICD-10-CM

## 2020-11-24 MED ORDER — FLUARIX QUADRIVALENT 0.5 ML IM SUSY
PREFILLED_SYRINGE | INTRAMUSCULAR | 0 refills | Status: DC
  Filled 2020-11-24: qty 0.5, 1d supply, fill #0

## 2020-11-24 MED ORDER — PFIZER COVID-19 VAC BIVALENT 30 MCG/0.3ML IM SUSP
INTRAMUSCULAR | 0 refills | Status: DC
  Filled 2020-11-24: qty 0.3, 1d supply, fill #0

## 2020-11-24 NOTE — Progress Notes (Signed)
   Covid-19 Vaccination Clinic  Name:  Rachael Hodge    MRN: [id number] DOB: April 28, 1965  11/24/2020  [name] was observed post Covid-19 immunization for 15 minutes without incident. She was provided with Vaccine Information Sheet and instruction to access the V-Safe system.   [name] was instructed to call 911 with any severe reactions post vaccine: Difficulty breathing  Swelling of face and throat  A fast heartbeat  A bad rash all over body  Dizziness and weakness   Immunizations Administered     Name Date Dose VIS Date Route   Pfizer Covid-19 Vaccine Bivalent Booster 11/24/2020 10:02 AM 0.3 mL 10/01/2020 Intramuscular   Manufacturer: [place]   Lot: [place]: [id number]

## 2020-12-01 ENCOUNTER — Other Ambulatory Visit (HOSPITAL_COMMUNITY): Payer: Self-pay

## 2020-12-19 ENCOUNTER — Other Ambulatory Visit: Payer: Self-pay | Admitting: Family Medicine

## 2020-12-19 DIAGNOSIS — Z1231 Encounter for screening mammogram for malignant neoplasm of breast: Secondary | ICD-10-CM

## 2020-12-24 DIAGNOSIS — E119 Type 2 diabetes mellitus without complications: Secondary | ICD-10-CM | POA: Diagnosis not present

## 2020-12-24 DIAGNOSIS — E7800 Pure hypercholesterolemia, unspecified: Secondary | ICD-10-CM | POA: Diagnosis not present

## 2020-12-24 DIAGNOSIS — I10 Essential (primary) hypertension: Secondary | ICD-10-CM | POA: Diagnosis not present

## 2020-12-24 LAB — LIPID PANEL
Chol/HDL Ratio: 2.4 ratio (ref 0.0–4.4)
Cholesterol, Total: 175 mg/dL (ref 100–199)
HDL: 74 mg/dL (ref >39)
LDL Chol Calc (NIH): 87 mg/dL (ref 0–99)
Triglycerides: 75 mg/dL (ref 0–149)
VLDL Cholesterol Cal: 14 mg/dL (ref 5–40)

## 2020-12-24 LAB — HEPATIC FUNCTION PANEL
ALT: 26 IU/L (ref 0–32)
AST: 27 IU/L (ref 0–40)
Albumin: 4.6 g/dL (ref 3.8–4.9)
Alkaline Phosphatase: 119 IU/L (ref 44–121)
Bilirubin Total: 0.4 mg/dL (ref 0.0–1.2)
Bilirubin, Direct: 0.12 mg/dL (ref 0.00–0.40)
Total Protein: 7.1 g/dL (ref 6.0–8.5)

## 2020-12-24 NOTE — Progress Notes (Signed)
Cardiology Office Note   Date:  12/31/2020   ID:  Rachael Hodge, DOB Apr 05, 1965, MRN [id number]  PCP:  Caren Macadam, MD  Cardiologist:   Tahji  Martinique, MD   Chief Complaint  Patient presents with   Coronary Artery Disease      History of Present Illness: Rachael Hodge is a 55 y.o. female who is seen at the request of Dr Ethlyn Gallery for assessment of cardiovascular risk. She has a history of DM type 2- diet controlled, HTN, and HLD. She has a family history of CAD with father having CABG at age 25.   She denies any symptoms of dyspnea, palpitations, chest pain or fatigue. Admits weight has gone up. Is sedentary. Works from home. Has 2 grown sons. Did not tolerate metformin due to GI upset. States she was on lipitor in the past and doesn't know why this was changed.   We performed coronary calcium score which was 135/95th percentile. She is seen today to discuss further. Her pravastatin was switched to high dose Crestor 40 mg daily. She is tolerating this well. Again no chest pain, dyspnea or fatigue. Is interviewing for a new job today.    Past Medical History:  Diagnosis Date   CIN I (cervical intraepithelial neoplasia I)    Depression    Diet-controlled type 2 diabetes mellitus ([place])    followed by pcp--- (08-17-2019 per pt only on occasion)   Eczema    GERD (gastroesophageal reflux disease)    GI bleed    History of cervical dysplasia 2006   s/p LEEP   Hyperlipidemia    Hypertension    followed by pcp   (08-17-2019 per pt never had a stress test)   IDA (iron deficiency anemia)    VAIN II (vaginal intraepithelial neoplasia grade II)    Wears glasses     Past Surgical History:  Procedure Laterality Date   COLONOSCOPY WITH ESOPHAGOGASTRODUODENOSCOPY (EGD)  04-29-2017  dr Silverio Decamp   GIVENS CAPSULE STUDY N/A 05/09/2017   Procedure: GIVENS CAPSULE STUDY;  Surgeon: Mauri Pole, MD;  Location: [facility];  Service: Endoscopy;  Laterality: N/A;    LEEP  2006   LEEP N/A 08/27/2019   Procedure: LOOP ELECTROSURGICAL EXCISION PROCEDURE (LEEP)/CERVICAL BIOPSY AND REMOVAL/ECC with colposcopy;  Surgeon: [name], MD;  Location: [facility];  Service: Gynecology;  Laterality: N/A;  CERVICAL BIOPSY AND REMOVAL/ECC   LESION REMOVAL N/A 08/27/2019   Procedure: LASER ABLATION OF Vaginal dysplasia;  Surgeon: [name], MD;  Location: [facility];  Service: Gynecology;  Laterality: N/A;     Current Outpatient Medications  Medication Sig Dispense Refill   aspirin EC 81 MG tablet Take 1 tablet (81 mg total) by mouth daily. Swallow whole. 90 tablet 3   COVID-19 mRNA bivalent vaccine, Pfizer, (PFIZER COVID-19 VAC BIVALENT) injection Inject into the muscle. 0.3 mL 0   Estradiol 10 MCG TABS vaginal tablet PLACE ONE TABLET VAGINALLY EVERY NIGHT AT BEDTIME FOR 1 WEEK, THEN SWITCH TO TWICE WEEKLY. 24 tablet 3   famotidine (PEPCID) 20 MG tablet Take 1 tablet (20 mg total) by mouth at bedtime. 30 tablet 3   ferrous sulfate 325 (65 FE) MG tablet Take 1 tablet (325 mg total) by mouth daily with breakfast.  3   FLUoxetine (PROZAC) 40 MG capsule Take 1 capsule by mouth daily as directed 90 capsule 1   influenza vac split quadrivalent PF (FLUARIX QUADRIVALENT) 0.5 ML injection Inject into  the muscle. 0.5 mL 0   losartan (COZAAR) 50 MG tablet TAKE 1 TABLET BY MOUTH ONCE DAILY 90 tablet 1   omeprazole (PRILOSEC) 40 MG capsule Take 1 capsule (40 mg total) by mouth daily. 30 capsule 3   omeprazole (PRILOSEC) 40 MG capsule Take 1 capsule (40 mg total) by mouth daily. 90 capsule 3   rosuvastatin (CRESTOR) 40 MG tablet Take 1 tablet (40 mg total) by mouth daily. 90 tablet 3   vitamin B-12 100 MCG tablet Take 1 tablet (100 mcg total) by mouth daily.     No current facility-administered medications for this visit.    Allergies:   Penicillins, Sulfa antibiotics, Feraheme [ferumoxytol], and Lisinopril    Social History:   The patient  reports that she has never smoked. She has never used smokeless tobacco. She reports current alcohol use of about 2.0 standard drinks per week. She reports that she does not use drugs.   Family History:  The patient's family history includes Alzheimer's disease in her maternal grandmother; CAD (age of onset: 44) in her father; Crohn's disease in her son; Diabetes in her paternal grandmother; Early death (age of onset: 82) in her mother; Heart attack in her maternal grandfather, maternal grandmother, and paternal grandmother; Heart disease (age of onset: 32) in her paternal grandfather; Heart disease (age of onset: 64) in her father; Hyperlipidemia in her father; Hypertension in her father; Stroke (age of onset: 36) in her father.    ROS:  Please see the history of present illness.   Otherwise, review of systems are positive for none.   All other systems are reviewed and negative.    PHYSICAL EXAM: VS:  BP (!) 150/80   Pulse 72   Ht 5' 2"  (1.575 m)   Wt 162 lb (73.5 kg)   LMP 12/02/2012   SpO2 99%   BMI 29.63 kg/m  , BMI Body mass index is 29.63 kg/m. GEN: Well nourished, well developed, in no acute distress HEENT: normal Neck: no JVD, carotid bruits, or masses Cardiac: RRR; no murmurs, rubs, or gallops,no edema  Respiratory:  clear to auscultation bilaterally, normal work of breathing GI: soft, nontender, nondistended, + BS MS: no deformity or atrophy Skin: warm and dry, no rash Neuro:  Strength and sensation are intact Psych: euthymic mood, full affect   EKG:  EKG is not ordered today.  Recent Labs: 12/30/2020: ALT 31; BUN 13; Creatinine, Ser 0.92; Hemoglobin 12.4; Platelets 202.0; Potassium 4.5; Sodium 138   02/29/20: A1c 6.5%  Lipid Panel    Component Value Date/Time   CHOL 175 12/24/2020 0822   TRIG 75 12/24/2020 0822   HDL 74 12/24/2020 0822   CHOLHDL 2.4 12/24/2020 0822   CHOLHDL 3 06/03/2020 0705   VLDL 23.6 06/03/2020 0705   LDLCALC 87 12/24/2020 0822    LDLCALC 141 (H) 09/28/2019 0742      Wt Readings from Last 3 Encounters:  12/31/20 162 lb (73.5 kg)  12/30/20 164 lb (74.4 kg)  10/29/20 162 lb (73.5 kg)      Other studies Reviewed: Additional studies/ records that were reviewed today include:   ADDENDUM REPORT: 09/25/2020 17:45   CLINICAL DATA:  30F for cardiovascular disease risk stratification   EXAM: Coronary Calcium Score   TECHNIQUE: A gated, non-contrast computed tomography scan of the heart was performed using 84m slice thickness. Axial images were analyzed on a dedicated workstation. Calcium scoring of the coronary arteries was performed using the Agatston method.   FINDINGS: Coronary arteries:  Normal origins.   Coronary Calcium Score:   Left main: 0   Left anterior descending artery: 79.9   Left circumflex artery: 6.73   Right coronary artery: 48.5   Total: 135   Percentile: 95th   Pericardium: Normal.   Ascending Aorta: Normal caliber. Atherosclerosis in the aortic arch and descending aorta.   Non-cardiac: See separate report from [facility].   IMPRESSION: Coronary calcium score of 135his was 95thcentile for age-, race-, and sex-matched controls.   Atherosclerosis in the aortic arch and descending aorta.   RECOMMENDATIONS: Coronary artery calcium (CAC) score is a strong predictor of incident coronary heart disease (CHD) and provides predictive information beyond traditional risk factors. CAC scoring is reasonable to use in the decision to withhold, postpone, or initiate statin therapy in intermediate-risk or selected borderline-risk asymptomatic adults (age 29-75 years and LDL-C >=70 to <190 mg/dL) who do not have diabetes or established atherosclerotic cardiovascular disease (ASCVD).* In intermediate-risk (10-year ASCVD risk >=7.5% to <20%) adults or selected borderline-risk (10-year ASCVD risk >=5% to <7.5%) adults in whom a CAC score is measured for the purpose of making  a treatment decision the following recommendations have been made:   If CAC=0, it is reasonable to withhold statin therapy and reassess in 5 to 10 years, as long as higher risk conditions are absent (diabetes mellitus, family history of premature CHD in first degree relatives (males <55 years; females <65 years), cigarette smoking, or LDL >=190 mg/dL).   If CAC is 1 to 99, it is reasonable to initiate statin therapy for patients >=79 years of age.   If CAC is >=100 or >=75th percentile, it is reasonable to initiate statin therapy at any age.   Cardiology referral should be considered for patients with CAC scores >=400 or >=75th percentile.   *2018 AHA/ACC/AACVPR/AAPA/ABC/ACPM/ADA/AGS/APhA/ASPC/NLA/PCNA Guideline on the Management of Blood Cholesterol: A Report of the American College of Cardiology/American Heart Association Task Force on Clinical Practice Guidelines. J Am Coll Cardiol. 2019;73(24):3168-3209.   Skeet Latch, MD     Electronically Signed   By: Skeet Latch M.D.   On: 09/25/2020 17:45   ASSESSMENT AND PLAN:  1. Hypercholesterolemia. Given DM, coronary calcification and other risk factors would ideally like to see LDL < 70. We did switch pravastatin to Crestor 40 mg daily. LDL dropped from 144 to 87. Will continue to focus on lifestyle modification for now. Repeat lab in 6 months. If still not at goal would consider additional lipid lowering therapy. 2. DM type 2. Per primary care. Stressed importance of lifestyle modification with diet, weight loss and regular aerobic exercise.  3. HTN- elevated today due to stress 4. Family history of CAD 5. Coronary artery calcification. Given lack of symptoms no further evaluation needed at this time. She is to notify us if she has any chest pain or dyspnea. Given risk I would recommend she take a baby ASA daily.     Disposition:   FU with me in 6 months with lab  Signed, Barclay Lennox Martinique, MD  12/31/2020 10:06 AM     [facility] [address] [place], [place], [address] Phone [phone], Fax [phone]

## 2020-12-30 ENCOUNTER — Other Ambulatory Visit (INDEPENDENT_AMBULATORY_CARE_PROVIDER_SITE_OTHER): Payer: 59

## 2020-12-30 ENCOUNTER — Other Ambulatory Visit (HOSPITAL_COMMUNITY): Payer: Self-pay

## 2020-12-30 ENCOUNTER — Encounter: Payer: Self-pay | Admitting: Gastroenterology

## 2020-12-30 ENCOUNTER — Ambulatory Visit: Payer: 59 | Admitting: Gastroenterology

## 2020-12-30 VITALS — BP 138/80 | HR 59 | Ht 62.0 in | Wt 164.0 lb

## 2020-12-30 DIAGNOSIS — K2210 Ulcer of esophagus without bleeding: Secondary | ICD-10-CM

## 2020-12-30 DIAGNOSIS — K219 Gastro-esophageal reflux disease without esophagitis: Secondary | ICD-10-CM

## 2020-12-30 DIAGNOSIS — D509 Iron deficiency anemia, unspecified: Secondary | ICD-10-CM

## 2020-12-30 LAB — COMPREHENSIVE METABOLIC PANEL
ALT: 31 U/L (ref 0–35)
AST: 25 U/L (ref 0–37)
Albumin: 4.6 g/dL (ref 3.5–5.2)
Alkaline Phosphatase: 104 U/L (ref 39–117)
BUN: 13 mg/dL (ref 6–23)
CO2: 28 mEq/L (ref 19–32)
Calcium: 9.5 mg/dL (ref 8.4–10.5)
Chloride: 102 mEq/L (ref 96–112)
Creatinine, Ser: 0.92 mg/dL (ref 0.40–1.20)
GFR: 68.93 mL/min (ref >60.00)
Glucose, Bld: 98 mg/dL (ref 70–99)
Potassium: 4.5 mEq/L (ref 3.5–5.1)
Sodium: 138 mEq/L (ref 135–145)
Total Bilirubin: 0.4 mg/dL (ref 0.2–1.2)
Total Protein: 7.3 g/dL (ref 6.0–8.3)

## 2020-12-30 LAB — IBC + FERRITIN
Ferritin: 13 ng/mL (ref 10.0–291.0)
Iron: 68 ug/dL (ref 42–145)
Saturation Ratios: 15.1 % — ABNORMAL LOW (ref 20.0–50.0)
TIBC: 450.8 ug/dL — ABNORMAL HIGH (ref 250.0–450.0)
Transferrin: 322 mg/dL (ref 212.0–360.0)

## 2020-12-30 LAB — CBC WITH DIFFERENTIAL/PLATELET
Basophils Absolute: 0 10*3/uL (ref 0.0–0.1)
Basophils Relative: 0.8 % (ref 0.0–3.0)
Eosinophils Absolute: 0.2 10*3/uL (ref 0.0–0.7)
Eosinophils Relative: 4 % (ref 0.0–5.0)
HCT: 37.8 % (ref 36.0–46.0)
Hemoglobin: 12.4 g/dL (ref 12.0–15.0)
Lymphocytes Relative: 13.2 % (ref 12.0–46.0)
Lymphs Abs: 0.6 10*3/uL — ABNORMAL LOW (ref 0.7–4.0)
MCHC: 32.9 g/dL (ref 30.0–36.0)
MCV: 83.4 fl (ref 78.0–100.0)
Monocytes Absolute: 0.3 10*3/uL (ref 0.1–1.0)
Monocytes Relative: 7.8 % (ref 3.0–12.0)
Neutro Abs: 3.1 10*3/uL (ref 1.4–7.7)
Neutrophils Relative %: 74.2 % (ref 43.0–77.0)
Platelets: 202 10*3/uL (ref 150.0–400.0)
RBC: 4.53 Mil/uL (ref 3.87–5.11)
RDW: 14.3 % (ref 11.5–15.5)
WBC: 4.2 10*3/uL (ref 4.0–10.5)

## 2020-12-30 MED ORDER — OMEPRAZOLE 40 MG PO CPDR
40.0000 mg | DELAYED_RELEASE_CAPSULE | Freq: Every day | ORAL | 3 refills | Status: DC
  Filled 2020-12-30: qty 90, 90d supply, fill #0

## 2020-12-30 MED ORDER — FAMOTIDINE 20 MG PO TABS
20.0000 mg | ORAL_TABLET | Freq: Every day | ORAL | 3 refills | Status: DC
  Filled 2020-12-30: qty 30, 30d supply, fill #0

## 2020-12-30 NOTE — Patient Instructions (Signed)
Your provider has requested that you go to the basement level for lab work before leaving today. Press "B" on the elevator. The lab is located at the first door on the left as you exit the elevator.   Take chelated iron capsules 18 mg daily  We will send Omeprazole to your pharmacy  Take Pepcid 20 mg at bedtime as needed  Follow up in 6 months  If you are age 57 or older, your body mass index should be between 23-30. Your Body mass index is 30 kg/m. If this is out of the aforementioned range listed, please consider follow up with your Primary Care Provider.  If you are age 60 or younger, your body mass index should be between 19-25. Your Body mass index is 30 kg/m. If this is out of the aformentioned range listed, please consider follow up with your Primary Care Provider.   ________________________________________________________  The [facility] providers would like to encourage you to use [facility] to communicate with providers for non-urgent requests or questions.  Due to long hold times on the telephone, sending your provider a message by [facility] may be a faster and more efficient way to get a response.  Please allow 48 business hours for a response.  Please remember that this is for non-urgent requests.  _______________________________________________________   Due to recent changes in healthcare laws, you may see the results of your imaging and laboratory studies on MyChart before your provider has had a chance to review them.  We understand that in some cases there may be results that are confusing or concerning to you. Not all laboratory results come back in the same time frame and the provider may be waiting for multiple results in order to interpret others.  Please give [place] 48 hours in order for your provider to thoroughly review all the results before contacting the office for clarification of your results.    I appreciate the  opportunity to care for you  Thank You   Harl Bowie , MD

## 2020-12-30 NOTE — Progress Notes (Signed)
Rachael Hodge    [id number]    06-20-1965  Primary Care Physician:Koberlein, Steele Berg, MD  Referring Physician: Caren Macadam, MD [place],  [place] [id number]   Chief complaint: GERD  HPI:  55 year old very pleasant female here for follow-up visit for GERD and iron deficiency anemia.  She is feeling better since the omeprazole dose was increased to 40 mg daily.  She does not have the severe heartburn and urge to eat frequently   She is doing well overall.  Denies any nausea, vomiting, abdominal pain, melena, dark stool or blood per rectum.  She is taking oral iron supplements.  Iron/TIBC/Ferritin/ %Sat    Component Value Date/Time   IRON 68 12/30/2020 1118   IRON 63 07/08/2016 1603   TIBC 450.8 (H) 12/30/2020 1118   TIBC 459 (H) 07/08/2016 1603   FERRITIN 13.0 12/30/2020 1118   FERRITIN 6 (L) 07/08/2016 1603   IRONPCTSAT 15.1 (L) 12/30/2020 1118   IRONPCTSAT 14 (L) 07/08/2016 1603      Relevant GI history:   EGD 04/29/2017: small hiatal hernia, minimal gastritis. H.pylori negative Colonoscopy 04/29/2017:small hyperplastic polyps, internal hemorrhoids otherwise normal Small bowel video capsule 05/09/17 scattered erythema and erosions in the terminal ileum CRP was normal.  Empirically treated with 36-monthcourse of budesonide 9 mg daily.  Patient reports significant improvement with increased energy level.    Outpatient Encounter Medications as of 12/30/2020  Medication Sig   COVID-19 mRNA bivalent vaccine, Pfizer, (PFIZER COVID-19 VAC BIVALENT) injection Inject into the muscle.   Estradiol 10 MCG TABS vaginal tablet PLACE ONE TABLET VAGINALLY EVERY NIGHT AT BEDTIME FOR 1 WEEK, THEN SWITCH TO TWICE WEEKLY.   ferrous sulfate 325 (65 FE) MG tablet Take 1 tablet (325 mg total) by mouth daily with breakfast.   FLUoxetine (PROZAC) 40 MG capsule Take 1 capsule by mouth daily as directed   influenza vac split quadrivalent PF (FLUARIX  QUADRIVALENT) 0.5 ML injection Inject into the muscle.   losartan (COZAAR) 50 MG tablet TAKE 1 TABLET BY MOUTH ONCE DAILY   omeprazole (PRILOSEC) 40 MG capsule Take 1 capsule (40 mg total) by mouth daily.   rosuvastatin (CRESTOR) 40 MG tablet Take 1 tablet (40 mg total) by mouth daily.   vitamin B-12 100 MCG tablet Take 1 tablet (100 mcg total) by mouth daily.   No facility-administered encounter medications on file as of 12/30/2020.    Allergies as of 12/30/2020 - Review Complete 12/30/2020  Allergen Reaction Noted   Penicillins Shortness Of Breath 08/02/2013   Sulfa antibiotics Shortness Of Breath 08/02/2013   Feraheme [ferumoxytol] Nausea Only and Other (See Comments) 09/28/2016   Lisinopril  10/16/2018    Past Medical History:  Diagnosis Date   CIN I (cervical intraepithelial neoplasia I)    Depression    Diet-controlled type 2 diabetes mellitus ([facility]    followed by pcp--- (08-17-2019 per pt only on occasion)   Eczema    GERD (gastroesophageal reflux disease)    GI bleed    History of cervical dysplasia 2006   s/p LEEP   Hyperlipidemia    Hypertension    followed by pcp   (08-17-2019 per pt never had a stress test)   IDA (iron deficiency anemia)    VAIN II (vaginal intraepithelial neoplasia grade II)    Wears glasses     Past Surgical History:  Procedure Laterality Date   COLONOSCOPY WITH ESOPHAGOGASTRODUODENOSCOPY (EGD)  04-29-2017  dr Silverio Decamp   GIVENS CAPSULE STUDY N/A 05/09/2017   Procedure: GIVENS CAPSULE STUDY;  Surgeon: Mauri Pole, MD;  [facility] ENDOSCOPY;  Service: Endoscopy;  Laterality: N/A;   LEEP  2006   LEEP N/A 08/27/2019   Procedure: LOOP ELECTROSURGICAL EXCISION PROCEDURE (LEEP)/CERVICAL BIOPSY AND REMOVAL/ECC with colposcopy;  Surgeon: [name], MD;  Location: [facility];  Service: Gynecology;  Laterality: N/A;  CERVICAL BIOPSY AND REMOVAL/ECC   LESION REMOVAL N/A 08/27/2019   Procedure: LASER ABLATION OF Vaginal  dysplasia;  Surgeon: [name], MD;  Location: [facility];  Service: Gynecology;  Laterality: N/A;    Family History  Problem Relation Age of Onset   Early death Mother 23       died in sleep; uncertain cause   Heart disease Father 81       CABG   Hyperlipidemia Father    Hypertension Father    Stroke Father 57   CAD Father 43       CAD, quadruple bypass   Heart attack Maternal Grandmother    Alzheimer's disease Maternal Grandmother    Heart attack Maternal Grandfather    Heart attack Paternal Grandmother    Diabetes Paternal Grandmother    Heart disease Paternal Grandfather 70   Crohn's disease Son    Breast cancer Neg Hx    Colon cancer Neg Hx     Social History   Socioeconomic History   Marital status: Married    Spouse name: Not on file   Number of children: 2   Years of education: Not on file   Highest education level: Not on file  Occupational History   Not on file  Tobacco Use   Smoking status: Never   Smokeless tobacco: Never  Vaping Use   Vaping Use: Never used  Substance and Sexual Activity   Alcohol use: Yes    Alcohol/week: 2.0 standard drinks    Types: 2 Standard drinks or equivalent per week    Comment: per week   Drug use: Never   Sexual activity: Yes    Partners: Male    Birth control/protection: Post-menopausal  Other Topics Concern   Not on file  Social History Narrative   Work or School: Research scientist (life sciences)       Home Situation: lives with boyfriend      Spiritual Beliefs: Christian      Lifestyle: exercising, trying to eat well            Social Determinants of Health   Financial Resource Strain: Not on file  Food Insecurity: Not on file  Transportation Needs: Not on file  Physical Activity: Not on file  Stress: Not on file  Social Connections: Not on file  Intimate Partner Violence: Not on file      Review of systems: All other review of systems negative except as  mentioned in the HPI.   Physical Exam: Vitals:   12/30/20 1047  BP: 138/80  Pulse: (!) 59   Body mass index is 30 kg/m. Gen:      No acute distress HEENT:  sclera anicteric Abd:      soft, non-tender; no palpable masses, no distension Ext:    No edema Neuro: alert and oriented x 3 Psych: normal mood and affect  Data Reviewed:  Reviewed labs, radiology imaging, old records and pertinent past GI work up   Assessment and Plan/Recommendations:  55 year old very pleasant female with history of iron deficiency  anemia, erosions in terminal ileum, gastritis and GERD  GERD: Symptoms improved with omeprazole 40 mg daily, continue to use it 30 minutes before breakfast Use Pepcid at bedtime as needed for breakthrough nocturnal symptoms Antireflux measures and avoid excessive use of caffeinated drinks Continue to avoid NSAIDs  Iron deficiency anemia secondary to chronic occult GI blood loss Continue oral iron supplements, advised patient to use chelated iron capsule, will be better tolerated and less likely to cause dyspepsia symptoms  Follow-up CBC, CMP and iron panel   Return in 6 months or sooner if needed  The patient was provided an opportunity to ask questions and all were answered. The patient agreed with the plan and demonstrated an understanding of the instructions.  Damaris Hippo , MD    CC: Caren Macadam, MD

## 2020-12-31 ENCOUNTER — Ambulatory Visit: Payer: 59 | Admitting: Cardiology

## 2020-12-31 ENCOUNTER — Other Ambulatory Visit: Payer: Self-pay

## 2020-12-31 ENCOUNTER — Encounter: Payer: Self-pay | Admitting: Cardiology

## 2020-12-31 VITALS — BP 150/80 | HR 72 | Ht 62.0 in | Wt 162.0 lb

## 2020-12-31 DIAGNOSIS — E119 Type 2 diabetes mellitus without complications: Secondary | ICD-10-CM

## 2020-12-31 DIAGNOSIS — I2510 Atherosclerotic heart disease of native coronary artery without angina pectoris: Secondary | ICD-10-CM | POA: Diagnosis not present

## 2020-12-31 DIAGNOSIS — I10 Essential (primary) hypertension: Secondary | ICD-10-CM | POA: Diagnosis not present

## 2020-12-31 DIAGNOSIS — E7800 Pure hypercholesterolemia, unspecified: Secondary | ICD-10-CM

## 2020-12-31 DIAGNOSIS — I2584 Coronary atherosclerosis due to calcified coronary lesion: Secondary | ICD-10-CM | POA: Diagnosis not present

## 2020-12-31 MED ORDER — ASPIRIN EC 81 MG PO TBEC: 81.0000 mg | DELAYED_RELEASE_TABLET | Freq: Every day | ORAL | 3 refills | Status: DC

## 2020-12-31 NOTE — Patient Instructions (Addendum)
Medication Instructions:  Start taking Aspirin 81 mg daily Continue all other medications *If you need a refill on your cardiac medications before your next appointment, please call your pharmacy*   Lab Work: Have bmet,lipid and hepatic panels done 1 week before May appointment    Testing/Procedures: None ordered   Follow-Up: At [facility], you and your health needs are our priority.  As part of our continuing mission to provide you with exceptional heart care, we have created designated Provider Care Teams.  These Care Teams include your primary Cardiologist (physician) and Advanced Practice Providers (APPs -  Physician Assistants and Nurse Practitioners) who all work together to provide you with the care you need, when you need it.  We recommend signing up for the patient portal called "MyChart".  Sign up information is provided on this After Visit Summary.  MyChart is used to connect with patients for Virtual Visits (Telemedicine).  Patients are able to view lab/test results, encounter notes, upcoming appointments, etc.  Non-urgent messages can be sent to your provider as well.   To learn more about what you can do with MyChart, go to [email or web address].     Your next appointment:  6 months    Tues 06/09/21 at 10:00 am    The format for your next appointment: Office    Provider:  Dr.Jordan

## 2021-01-06 ENCOUNTER — Ambulatory Visit
Admission: RE | Admit: 2021-01-06 | Discharge: 2021-01-06 | Disposition: A | Discharge status: Home / Self Care | Payer: 59 | Source: Ambulatory Visit

## 2021-01-06 DIAGNOSIS — Z1231 Encounter for screening mammogram for malignant neoplasm of breast: Secondary | ICD-10-CM | POA: Diagnosis not present

## 2021-01-09 ENCOUNTER — Other Ambulatory Visit (HOSPITAL_COMMUNITY): Payer: Self-pay

## 2021-02-26 ENCOUNTER — Other Ambulatory Visit (HOSPITAL_COMMUNITY): Payer: Self-pay

## 2021-02-26 ENCOUNTER — Other Ambulatory Visit: Payer: Self-pay | Admitting: Family Medicine

## 2021-02-26 MED ORDER — FLUOXETINE HCL 40 MG PO CAPS
ORAL_CAPSULE | Freq: Every day | ORAL | 1 refills | Status: DC
  Filled 2021-02-26: qty 90, 90d supply, fill #0
  Filled 2021-05-25: qty 90, 90d supply, fill #1

## 2021-02-28 ENCOUNTER — Encounter: Payer: Self-pay | Admitting: Family Medicine

## 2021-03-04 ENCOUNTER — Encounter: Payer: Self-pay | Admitting: Obstetrics and Gynecology

## 2021-03-12 DIAGNOSIS — E119 Type 2 diabetes mellitus without complications: Secondary | ICD-10-CM | POA: Diagnosis not present

## 2021-03-30 DIAGNOSIS — E7849 Other hyperlipidemia: Secondary | ICD-10-CM | POA: Diagnosis not present

## 2021-03-30 DIAGNOSIS — R5383 Other fatigue: Secondary | ICD-10-CM | POA: Diagnosis not present

## 2021-03-30 DIAGNOSIS — E8881 Metabolic syndrome: Secondary | ICD-10-CM | POA: Diagnosis not present

## 2021-03-30 DIAGNOSIS — Z13220 Encounter for screening for lipoid disorders: Secondary | ICD-10-CM | POA: Diagnosis not present

## 2021-04-09 ENCOUNTER — Other Ambulatory Visit (HOSPITAL_COMMUNITY): Payer: Self-pay

## 2021-04-09 ENCOUNTER — Other Ambulatory Visit: Payer: Self-pay | Admitting: Family Medicine

## 2021-04-09 DIAGNOSIS — I10 Essential (primary) hypertension: Secondary | ICD-10-CM

## 2021-04-09 MED ORDER — LOSARTAN POTASSIUM 50 MG PO TABS
ORAL_TABLET | Freq: Every day | ORAL | 1 refills | Status: DC
  Filled 2021-04-09: qty 90, 90d supply, fill #0
  Filled 2021-05-25 – 2021-07-04 (×2): qty 90, 90d supply, fill #1

## 2021-04-24 ENCOUNTER — Other Ambulatory Visit (HOSPITAL_COMMUNITY): Payer: Self-pay

## 2021-04-29 ENCOUNTER — Ambulatory Visit: Payer: 59 | Admitting: Family Medicine

## 2021-04-30 IMAGING — MG MM DIGITAL SCREENING BILAT W/ TOMO W/ CAD
8 series · 8 of 24 positions shown · non-contrast
Comparison: Previous exam(s).

CLINICAL DATA: Screening.

EXAM:
DIGITAL SCREENING BILATERAL MAMMOGRAM WITH TOMO AND CAD

[R CC synth-2D]
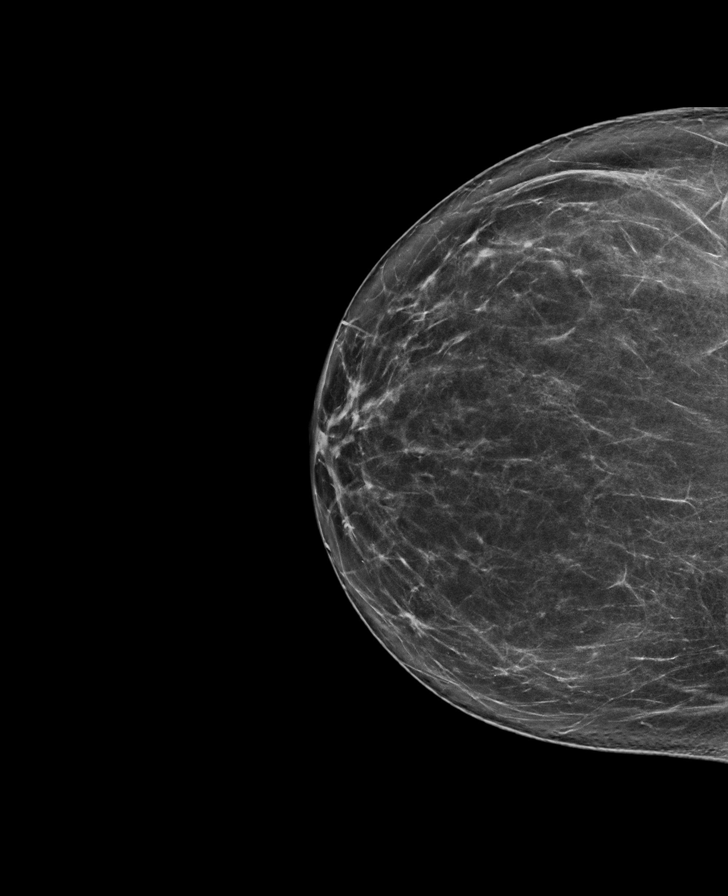

[L MLO synth-2D]
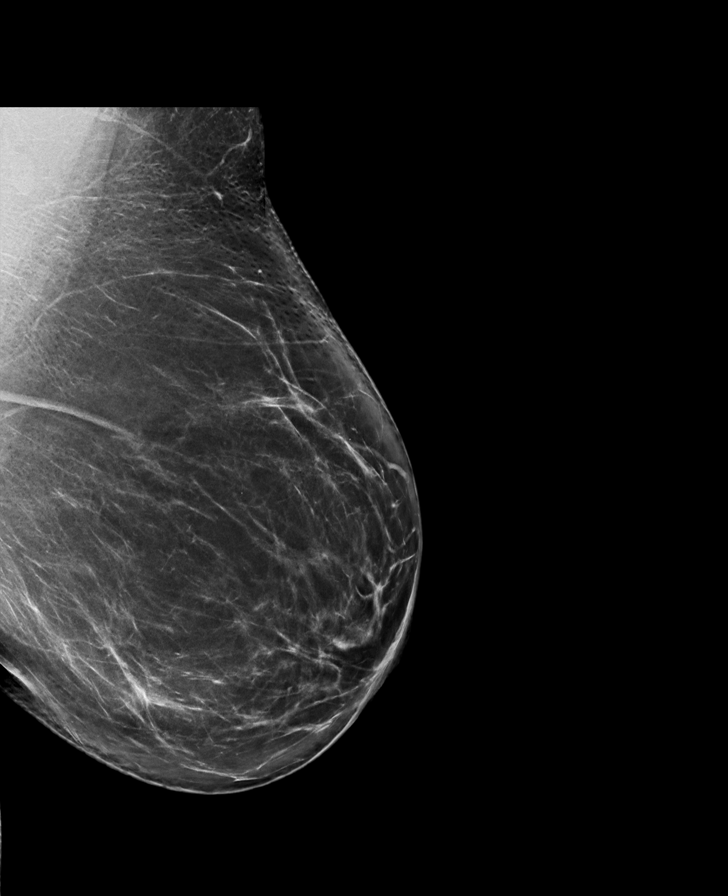

[L CC synth-2D]
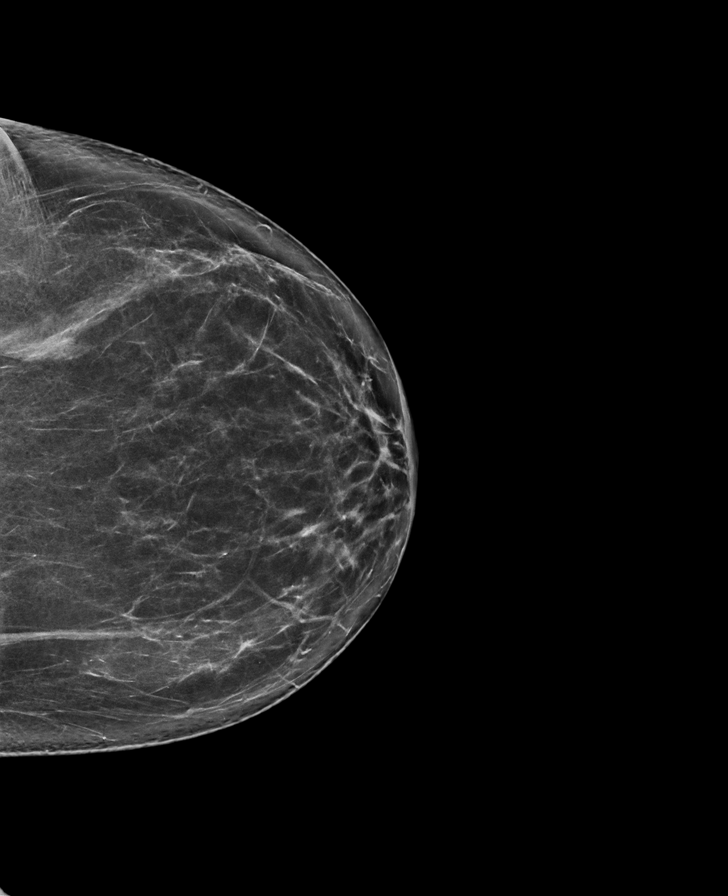

[R MLO synth-2D]
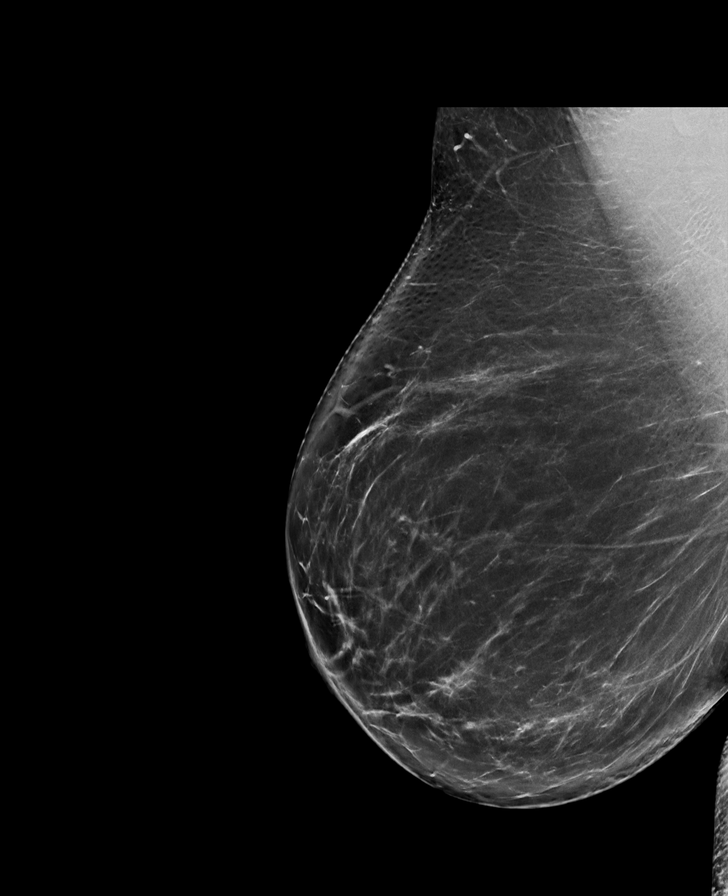

[R CC tomo · tomo slice 40/79.0]
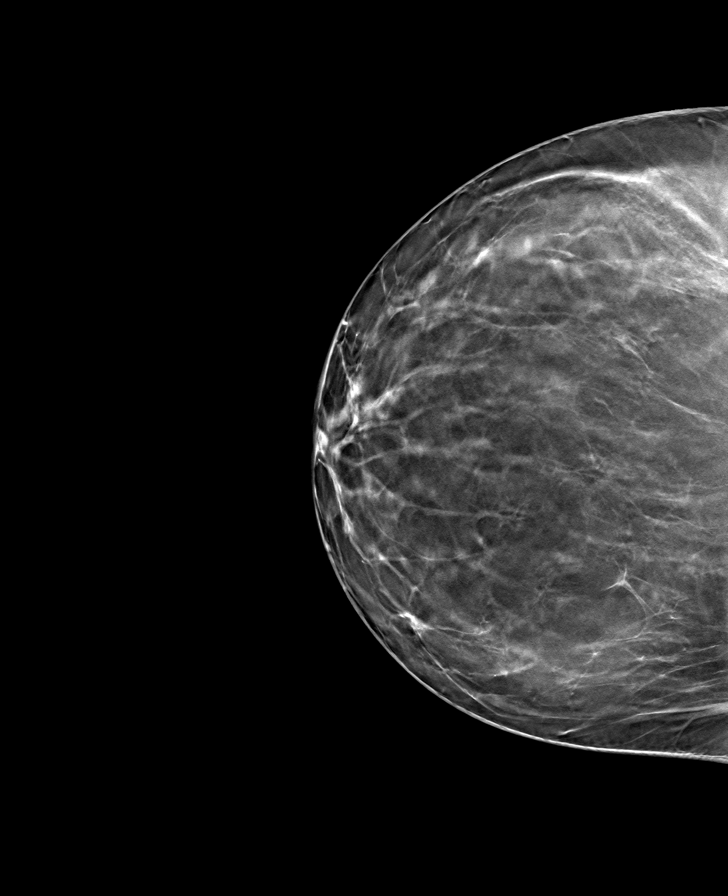

[L MLO tomo · tomo slice 47/93.0]
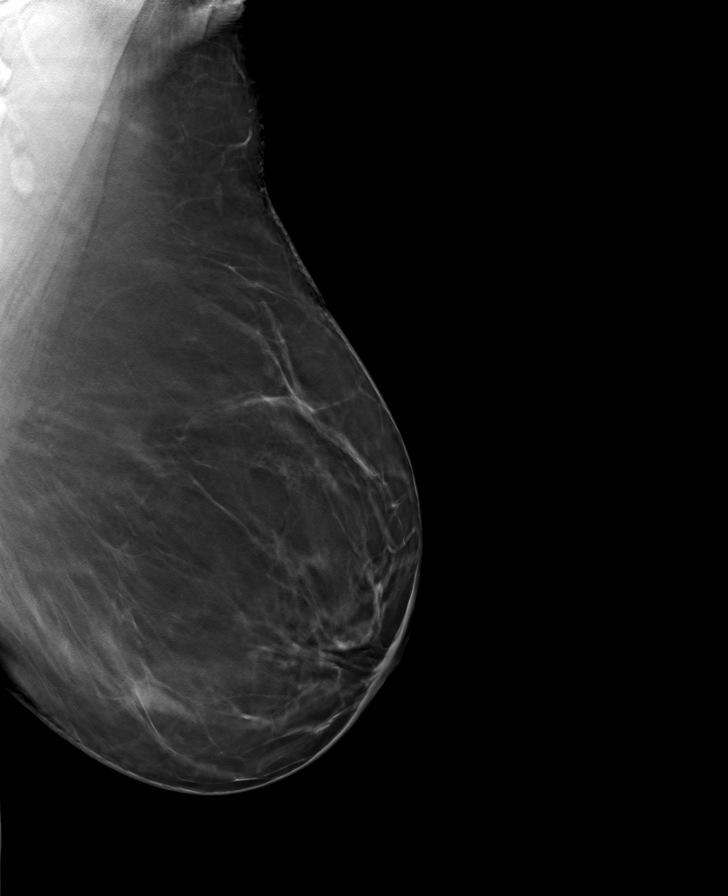

[L CC tomo · tomo slice 41/81.0]
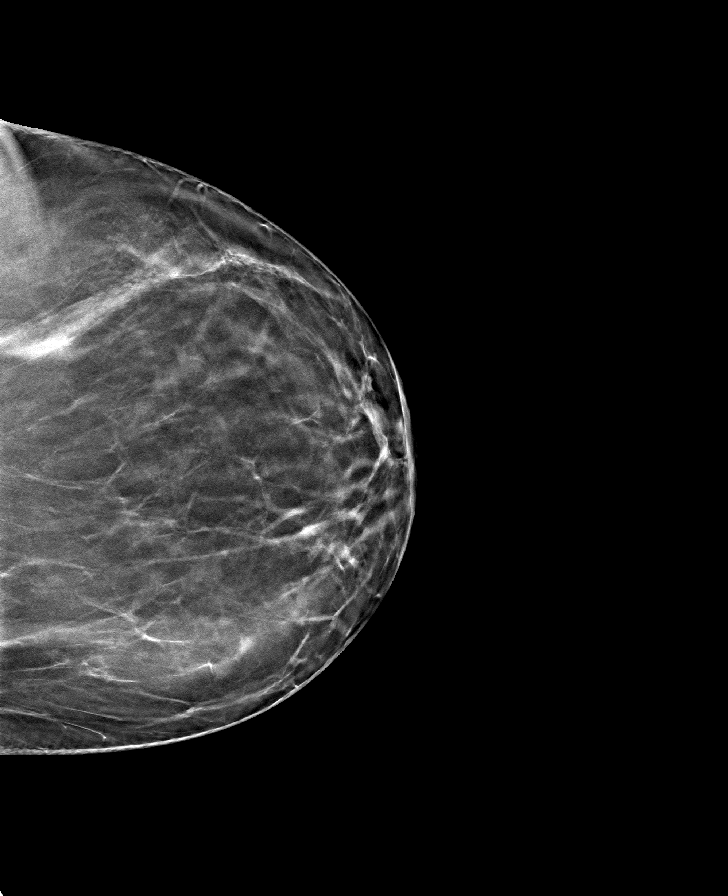

[R MLO tomo · tomo slice 49/96.0]
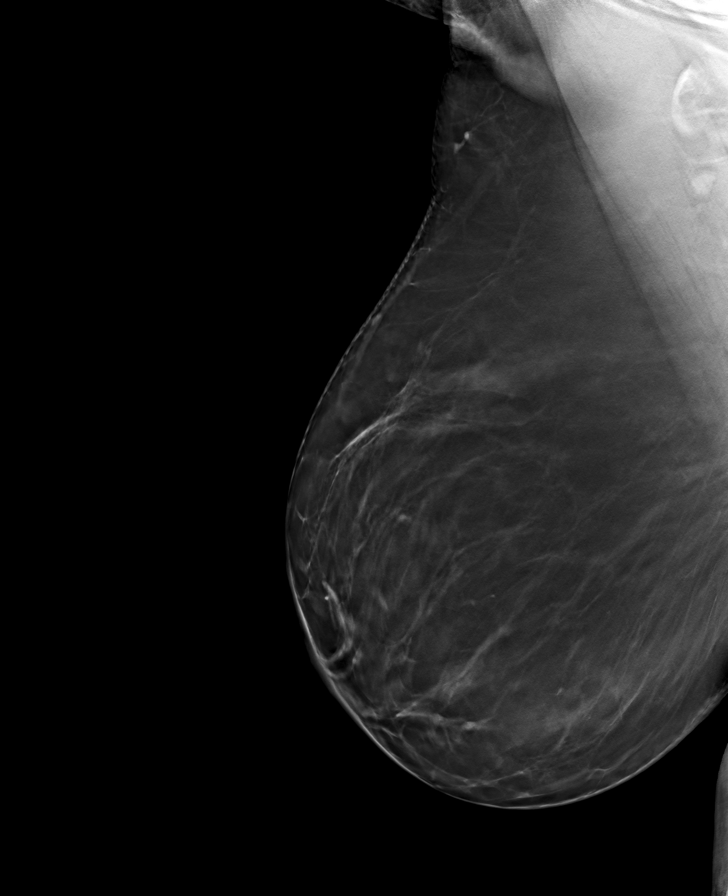

[8 of 24 positions shown; findings below may reference images not displayed]

ACR Breast Density Category b: There are scattered areas of
fibroglandular density.
FINDINGS: There are no findings suspicious for malignancy. Images were
processed with CAD.
IMPRESSION: No mammographic evidence of malignancy. A result letter of this
screening mammogram will be mailed directly to the patient.

RECOMMENDATION:
Screening mammogram in one year. (Code:[id number])

BI-RADS CATEGORY  1: Negative.

## 2021-05-12 ENCOUNTER — Other Ambulatory Visit (HOSPITAL_COMMUNITY): Payer: Self-pay

## 2021-05-12 MED ORDER — KETOROLAC TROMETHAMINE 10 MG PO TABS
ORAL_TABLET | ORAL | 0 refills | Status: DC
  Filled 2021-05-12: qty 16, 4d supply, fill #0

## 2021-05-12 MED ORDER — HYDROCODONE-ACETAMINOPHEN 7.5-325 MG PO TABS
ORAL_TABLET | ORAL | 0 refills | Status: DC
  Filled 2021-05-12: qty 6, 1d supply, fill #0

## 2021-05-25 ENCOUNTER — Other Ambulatory Visit (HOSPITAL_COMMUNITY): Payer: Self-pay

## 2021-05-27 ENCOUNTER — Ambulatory Visit: Payer: 59 | Admitting: Family Medicine

## 2021-05-29 ENCOUNTER — Ambulatory Visit: Payer: 59 | Admitting: Family Medicine

## 2021-05-29 ENCOUNTER — Encounter: Payer: Self-pay | Admitting: Family Medicine

## 2021-05-29 VITALS — BP 110/80 | HR 76 | Temp 97.9°F | Ht 62.0 in | Wt 161.6 lb

## 2021-05-29 DIAGNOSIS — R7989 Other specified abnormal findings of blood chemistry: Secondary | ICD-10-CM

## 2021-05-29 DIAGNOSIS — R739 Hyperglycemia, unspecified: Secondary | ICD-10-CM

## 2021-05-29 DIAGNOSIS — I10 Essential (primary) hypertension: Secondary | ICD-10-CM

## 2021-05-29 DIAGNOSIS — E538 Deficiency of other specified B group vitamins: Secondary | ICD-10-CM

## 2021-05-29 DIAGNOSIS — E871 Hypo-osmolality and hyponatremia: Secondary | ICD-10-CM | POA: Diagnosis not present

## 2021-05-29 DIAGNOSIS — E7800 Pure hypercholesterolemia, unspecified: Secondary | ICD-10-CM | POA: Diagnosis not present

## 2021-05-29 DIAGNOSIS — E119 Type 2 diabetes mellitus without complications: Secondary | ICD-10-CM | POA: Diagnosis not present

## 2021-05-29 LAB — LIPID PANEL
Cholesterol: 252 mg/dL — ABNORMAL HIGH (ref 0–200)
HDL: 76.1 mg/dL (ref >39.00)
LDL Cholesterol: 153 mg/dL — ABNORMAL HIGH (ref 0–99)
NonHDL: 175.84
Total CHOL/HDL Ratio: 3
Triglycerides: 114 mg/dL (ref 0.0–149.0)
VLDL: 22.8 mg/dL (ref 0.0–40.0)

## 2021-05-29 LAB — CBC WITH DIFFERENTIAL/PLATELET
Basophils Absolute: 0 10*3/uL (ref 0.0–0.1)
Basophils Relative: 0.9 % (ref 0.0–3.0)
Eosinophils Absolute: 0.2 10*3/uL (ref 0.0–0.7)
Eosinophils Relative: 4.5 % (ref 0.0–5.0)
HCT: 39.6 % (ref 36.0–46.0)
Hemoglobin: 13 g/dL (ref 12.0–15.0)
Lymphocytes Relative: 13.7 % (ref 12.0–46.0)
Lymphs Abs: 0.5 10*3/uL — ABNORMAL LOW (ref 0.7–4.0)
MCHC: 32.9 g/dL (ref 30.0–36.0)
MCV: 82.2 fl (ref 78.0–100.0)
Monocytes Absolute: 0.4 10*3/uL (ref 0.1–1.0)
Monocytes Relative: 10.6 % (ref 3.0–12.0)
Neutro Abs: 2.5 10*3/uL (ref 1.4–7.7)
Neutrophils Relative %: 70.3 % (ref 43.0–77.0)
Platelets: 258 10*3/uL (ref 150.0–400.0)
RBC: 4.81 Mil/uL (ref 3.87–5.11)
RDW: 14.5 % (ref 11.5–15.5)
WBC: 3.6 10*3/uL — ABNORMAL LOW (ref 4.0–10.5)

## 2021-05-29 LAB — COMPREHENSIVE METABOLIC PANEL
ALT: 29 U/L (ref 0–35)
AST: 27 U/L (ref 0–37)
Albumin: 4.8 g/dL (ref 3.5–5.2)
Alkaline Phosphatase: 80 U/L (ref 39–117)
BUN: 14 mg/dL (ref 6–23)
CO2: 29 mEq/L (ref 19–32)
Calcium: 10 mg/dL (ref 8.4–10.5)
Chloride: 97 mEq/L (ref 96–112)
Creatinine, Ser: 0.89 mg/dL (ref 0.40–1.20)
GFR: 71.52 mL/min (ref >60.00)
Glucose, Bld: 89 mg/dL (ref 70–99)
Potassium: 4.8 mEq/L (ref 3.5–5.1)
Sodium: 134 mEq/L — ABNORMAL LOW (ref 135–145)
Total Bilirubin: 0.4 mg/dL (ref 0.2–1.2)
Total Protein: 7.5 g/dL (ref 6.0–8.3)

## 2021-05-29 LAB — MICROALBUMIN / CREATININE URINE RATIO
Creatinine,U: 104.9 mg/dL
Microalb Creat Ratio: 0.7 mg/g (ref 0.0–30.0)
Microalb, Ur: <0.7 mg/dL (ref 0.0–1.9)

## 2021-05-29 LAB — HEMOGLOBIN A1C: Hgb A1c MFr Bld: 6.3 % (ref 4.6–6.5)

## 2021-05-29 NOTE — Progress Notes (Signed)
?Nelsie Domino ?DOB: 1965/08/20 ?Encounter date: 05/29/2021 ? ?This is a 56 y.o. female who presents with ?Chief Complaint  ?Patient presents with  ? Follow-up  ? ? ?History of present illness: ?Last visit with me was 10/29/2020 for complete physical. ? ?Hypertension: Losartan 50 mg daily. Does occasionally check at home - 859'Y systolic 92'K diastolic.  ? ?Hyperlipidemia: Crestor 40 mg daily ? ?GERD: Pepcid 20 mg at bedtime, Prilosec 40 mg daily.  She did follow-up with GI (Dr. Silverio Decamp) 12/30/2020. Doing really well from this standpoint. Not taking pepcid unless she skips the prilosec which isn't very often. Also decrease prilosec to 81m. Eating cleaner which has helped.  ? ?Has cyst around impacted wisdom tooth and getting that removed next week - pain medication on list is in anticipation of this. State that it's fast growing and they want to remove so there is no bony destruction.  ? ?Allergies  ?Allergen Reactions  ? Penicillins Shortness Of Breath  ? Sulfa Antibiotics Shortness Of Breath  ? Feraheme [Ferumoxytol] Nausea Only and Other (See Comments)  ?  Hypotension and low heart rate  ? Lisinopril   ?  cough  ? ?Current Meds  ?Medication Sig  ? aspirin EC 81 MG tablet Take 1 tablet (81 mg total) by mouth daily. Swallow whole.  ? COVID-19 mRNA bivalent vaccine, Pfizer, (PFIZER COVID-19 VAC BIVALENT) injection Inject into the muscle.  ? ferrous sulfate 325 (65 FE) MG tablet Take 1 tablet (325 mg total) by mouth daily with breakfast.  ? FLUoxetine (PROZAC) 40 MG capsule Take 1 capsule by mouth daily as directed  ? HYDROcodone-acetaminophen (NORCO) 7.5-325 MG tablet Take 1/2-1 tablet by mouth every 4-6 hours as needed for severe pain  ? influenza vac split quadrivalent PF (FLUARIX QUADRIVALENT) 0.5 ML injection Inject into the muscle.  ? ketorolac (TORADOL) 10 MG tablet Take one tablet by mouth 4 times a day, start six hours after IV injection. IV injection will be given in office prior to surgery.  ?  losartan (COZAAR) 50 MG tablet TAKE 1 TABLET BY MOUTH ONCE DAILY  ? omeprazole (PRILOSEC) 20 MG capsule Take 20 mg by mouth daily.  ? vitamin B-12 100 MCG tablet Take 1 tablet (100 mcg total) by mouth daily.  ? ? ?Review of Systems  ?Constitutional:  Negative for chills, fatigue and fever.  ?Respiratory:  Negative for cough, chest tightness, shortness of breath and wheezing.   ?Cardiovascular:  Negative for chest pain, palpitations and leg swelling.  ? ?Objective: ? ?BP 110/80   Pulse 76   Temp 97.9 ?F (36.6 ?C) (Oral)   Ht 5' 2"  (1.575 m)   Wt 161 lb 9.6 oz (73.3 kg)   LMP 12/02/2012   SpO2 99%   BMI 29.56 kg/m?   Weight: 161 lb 9.6 oz (73.3 kg)  ? ?BP Readings from Last 3 Encounters:  ?05/29/21 110/80  ?12/31/20 (!) 150/80  ?12/30/20 138/80  ? ?Wt Readings from Last 3 Encounters:  ?05/29/21 161 lb 9.6 oz (73.3 kg)  ?12/31/20 162 lb (73.5 kg)  ?12/30/20 164 lb (74.4 kg)  ? ? ?Physical Exam ?Constitutional:   ?   General: She is not in acute distress. ?   Appearance: She is well-developed.  ?Cardiovascular:  ?   Rate and Rhythm: Normal rate and regular rhythm.  ?   Heart sounds: Normal heart sounds. No murmur heard. ?  No friction rub.  ?Pulmonary:  ?   Effort: Pulmonary effort is normal. No respiratory distress.  ?  Breath sounds: Normal breath sounds. No wheezing or rales.  ?Musculoskeletal:  ?   Right lower leg: No edema.  ?   Left lower leg: No edema.  ?Neurological:  ?   Mental Status: She is alert and oriented to person, place, and time.  ?Psychiatric:     ?   Behavior: Behavior normal.  ? ? ?Assessment/Plan ? ?1. Hyperglycemia ?- Hemoglobin A1c; Future ?- Microalbumin / creatinine urine ratio; Future ? ?2. Essential hypertension ?Systolic is lower; diastolic remains about 80. Will continue to monitor. Would like to see diastolic in 93'T, but do not want to further decrease systolic at this time.  ?- Comprehensive metabolic panel; Future ?- CBC with Differential/Platelet; Future ? ?3. Controlled type 2  diabetes mellitus without complication, without long-term current use of insulin ([place]) ?Recheck bloodwork today. Has improved diet and exercise.  ? ?4. Pure hypercholesterolemia ?- Lipid panel; Future ? ?5. Low serum vitamin B12 ?Continues on b12; last levels normal.  ? ? ?Return for pending lab or imaging results. ? ? ? ? ?Micheline Rough, MD ?

## 2021-06-02 NOTE — Addendum Note (Signed)
Addended by: Agnes Lawrence on: 06/02/2021 11:37 AM ? ? Modules accepted: Orders ? ?

## 2021-06-05 DIAGNOSIS — M278 Other specified diseases of jaws: Secondary | ICD-10-CM | POA: Diagnosis not present

## 2021-06-09 ENCOUNTER — Ambulatory Visit: Payer: 59 | Admitting: Cardiology

## 2021-06-15 ENCOUNTER — Encounter: Payer: Self-pay | Admitting: Family Medicine

## 2021-06-18 ENCOUNTER — Other Ambulatory Visit (HOSPITAL_COMMUNITY): Payer: Self-pay

## 2021-06-18 MED ORDER — CLINDAMYCIN HCL 150 MG PO CAPS
ORAL_CAPSULE | ORAL | 0 refills | Status: DC
  Filled 2021-06-18: qty 28, 7d supply, fill #0

## 2021-07-04 ENCOUNTER — Other Ambulatory Visit (HOSPITAL_COMMUNITY): Payer: Self-pay

## 2021-08-12 ENCOUNTER — Other Ambulatory Visit (HOSPITAL_COMMUNITY): Payer: Self-pay

## 2021-08-12 MED ORDER — CLINDAMYCIN HCL 150 MG PO CAPS
ORAL_CAPSULE | ORAL | 0 refills | Status: DC
  Filled 2021-08-12: qty 20, 5d supply, fill #0

## 2021-08-21 ENCOUNTER — Other Ambulatory Visit (HOSPITAL_COMMUNITY): Payer: Self-pay

## 2021-08-21 ENCOUNTER — Other Ambulatory Visit: Payer: Self-pay

## 2021-08-24 ENCOUNTER — Other Ambulatory Visit (HOSPITAL_COMMUNITY): Payer: Self-pay

## 2021-08-26 ENCOUNTER — Other Ambulatory Visit (HOSPITAL_COMMUNITY): Payer: Self-pay

## 2021-08-28 ENCOUNTER — Telehealth: Payer: Self-pay | Admitting: Family Medicine

## 2021-08-28 NOTE — Telephone Encounter (Signed)
Pt is calling and has TOC with dr Legrand Como on 10-06-2021 and would like a refills on losartan (COZAAR) 50 MG tablet and FLUoxetine (PROZAC) 40 MG capsule  [place] Phone:  [phone]  Fax:  [phone]

## 2021-09-02 ENCOUNTER — Other Ambulatory Visit: Payer: Self-pay | Admitting: Family Medicine

## 2021-09-02 ENCOUNTER — Telehealth: Payer: Self-pay | Admitting: Family Medicine

## 2021-09-02 ENCOUNTER — Other Ambulatory Visit (HOSPITAL_COMMUNITY): Payer: Self-pay

## 2021-09-02 DIAGNOSIS — I10 Essential (primary) hypertension: Secondary | ICD-10-CM

## 2021-09-02 MED ORDER — FLUOXETINE HCL 40 MG PO CAPS
ORAL_CAPSULE | Freq: Every day | ORAL | 0 refills | Status: DC
  Filled 2021-09-02: qty 90, 90d supply, fill #0

## 2021-09-02 MED ORDER — LOSARTAN POTASSIUM 50 MG PO TABS
ORAL_TABLET | Freq: Every day | ORAL | 0 refills | Status: DC
  Filled 2021-09-02: qty 90, fill #0
  Filled 2021-09-08 – 2021-09-19 (×2): qty 90, 90d supply, fill #0

## 2021-09-02 NOTE — Telephone Encounter (Signed)
Rx done. 

## 2021-09-02 NOTE — Telephone Encounter (Signed)
Pt called to request a refill of the following:  FLUoxetine (PROZAC) 40 MG capsule  losartan (COZAAR) 50 MG tablet  Pt's last OV:  05/29/21 Pt has a TOC for 10/06/21 with Dr. Legrand Como. Please advise.  Please send to: [facility] Phone:  [phone]  Fax:  [phone]

## 2021-09-02 NOTE — Telephone Encounter (Signed)
Ok to refill  - pt has appt with me

## 2021-09-07 ENCOUNTER — Other Ambulatory Visit: Payer: 59

## 2021-09-08 ENCOUNTER — Other Ambulatory Visit (HOSPITAL_COMMUNITY): Payer: Self-pay

## 2021-09-09 ENCOUNTER — Encounter (INDEPENDENT_AMBULATORY_CARE_PROVIDER_SITE_OTHER): Payer: Self-pay

## 2021-09-19 ENCOUNTER — Other Ambulatory Visit (HOSPITAL_COMMUNITY): Payer: Self-pay

## 2021-10-06 ENCOUNTER — Encounter: Payer: 59 | Admitting: Family Medicine

## 2021-10-12 ENCOUNTER — Encounter: Payer: Self-pay | Admitting: Hematology

## 2021-10-12 ENCOUNTER — Encounter: Payer: Self-pay | Admitting: Family Medicine

## 2021-10-12 ENCOUNTER — Ambulatory Visit: Payer: 59 | Admitting: Family Medicine

## 2021-10-12 ENCOUNTER — Other Ambulatory Visit (HOSPITAL_COMMUNITY): Payer: Self-pay

## 2021-10-12 VITALS — BP 136/88 | HR 82 | Temp 98.3°F | Ht 62.0 in | Wt 162.2 lb

## 2021-10-12 DIAGNOSIS — F325 Major depressive disorder, single episode, in full remission: Secondary | ICD-10-CM

## 2021-10-12 DIAGNOSIS — E119 Type 2 diabetes mellitus without complications: Secondary | ICD-10-CM

## 2021-10-12 DIAGNOSIS — E782 Mixed hyperlipidemia: Secondary | ICD-10-CM

## 2021-10-12 DIAGNOSIS — Z23 Encounter for immunization: Secondary | ICD-10-CM

## 2021-10-12 DIAGNOSIS — I10 Essential (primary) hypertension: Secondary | ICD-10-CM

## 2021-10-12 LAB — POCT GLYCOSYLATED HEMOGLOBIN (HGB A1C): Hemoglobin A1C: 6 % — AB (ref 4.0–5.6)

## 2021-10-12 MED ORDER — LOSARTAN POTASSIUM 50 MG PO TABS
ORAL_TABLET | Freq: Every day | ORAL | 1 refills | Status: DC
  Filled 2021-10-12: qty 90, fill #0
  Filled 2021-12-28: qty 90, 90d supply, fill #0
  Filled 2022-03-28: qty 30, 30d supply, fill #1

## 2021-10-12 MED ORDER — FLUOXETINE HCL 40 MG PO CAPS
40.0000 mg | ORAL_CAPSULE | Freq: Every day | ORAL | 1 refills | Status: DC
  Filled 2021-10-12: qty 90, fill #0
  Filled 2021-11-20: qty 90, 90d supply, fill #0
  Filled 2022-02-17: qty 30, 30d supply, fill #1
  Filled 2022-03-22: qty 30, 30d supply, fill #2

## 2021-10-12 NOTE — Patient Instructions (Signed)
Try herbal supplement called Berberine, 500 or 600 mg tablets, take them 30 minutes before meals.

## 2021-10-12 NOTE — Progress Notes (Signed)
   Established Patient Office Visit  Subjective   Patient ID: Rachael Hodge, female    DOB: [birth year]  Age: 56 y.o. MRN: [id number]  Chief Complaint  Patient presents with  . Establish Care    HTN -- BP performed in office and is elevated today. Patient states her BP is "always high" when she comes to the doctor's office. She denies chest pain and SOB.   HLD- We reviewed her last lipid panel. Her current CVD risk score 7.8%. We had a long discussion    Diabetes - patient's A1C performed today and is 6.0. Patient is not on any medication for this      Patient Active Problem List   Diagnosis Date Noted  . Diabetes mellitus type II, controlled ([place]) 10/16/2018  . Gastritis 07/03/2018  . Low serum vitamin B12 07/03/2018  . Heme positive stool   . Iron deficiency anemia 10/08/2016  . Hyperlipemia 09/29/2015  . Essential hypertension 09/29/2015  . Major depressive disorder with single episode, in full remission ([place]) 09/29/2015      ROS    Objective:     BP (!) 140/90 (BP Location: Left Arm, Patient Position: Sitting, Cuff Size: Large)   Pulse 82   Temp 98.3 F (36.8 C) (Oral)   Ht 5' 2"  (1.575 m)   Wt 162 lb 3.2 oz (73.6 kg)   LMP 12/02/2012   SpO2 98%   BMI 29.67 kg/m  BP Readings from Last 3 Encounters:  10/12/21 (!) 140/90  05/29/21 110/80  12/31/20 (!) 150/80   Wt Readings from Last 3 Encounters:  10/12/21 162 lb 3.2 oz (73.6 kg)  05/29/21 161 lb 9.6 oz (73.3 kg)  12/31/20 162 lb (73.5 kg)      Physical Exam   Results for orders placed or performed in visit on 10/12/21  POC HgB A1c  Result Value Ref Range   Hemoglobin A1C 6.0 (A) 4.0 - 5.6 %   HbA1c POC (<> result, manual entry)     HbA1c, POC (prediabetic range)     HbA1c, POC (controlled diabetic range)      Last lipids Lab Results  Component Value Date   CHOL 252 (H) 05/29/2021   HDL 76.10 05/29/2021   LDLCALC 153 (H) 05/29/2021   TRIG 114.0 05/29/2021   CHOLHDL 3 05/29/2021    Last hemoglobin A1c Lab Results  Component Value Date   HGBA1C 6.0 (A) 10/12/2021      The 10-year ASCVD risk score (Arnett DK, et al., 2019) is: 7.8%    Assessment & Plan:   Problem List Items Addressed This Visit       Cardiovascular and Mediastinum   Essential hypertension     Endocrine   Diabetes mellitus type II, controlled ([place]) - Primary   Relevant Orders   POC HgB A1c (Completed)     Other   Hyperlipemia    Return in about 6 months (around 04/12/2022) for follow up diabetes nad HTN.    Farrel Conners, MD

## 2021-10-13 NOTE — Assessment & Plan Note (Signed)
Pt reports good control of her depression symptoms with her prozac 40 mg daily, she denies any side effects to the medication at this time. I have refilled this for her today.

## 2021-10-13 NOTE — Assessment & Plan Note (Signed)
CVD risk is 7.8%, we had a discussion about starting a statin medication, pt wants to wait for now. Will continue to monitor lipid panel yearly.

## 2021-10-13 NOTE — Assessment & Plan Note (Signed)
Current hypertension medications:      Sig   losartan (COZAAR) 50 MG tablet TAKE 1 TABLET BY MOUTH ONCE DAILY     BP performed in office and is slightly elevated, pt state sat home it is usually normal. I advised she continue to check her BP at home and send me the readings over MyChart. RTC in 6 months.

## 2021-10-13 NOTE — Assessment & Plan Note (Signed)
6.0 today, controlled with diet and exercise only, doing very well, continue her current lifestyle modifications.

## 2021-11-20 ENCOUNTER — Encounter: Payer: Self-pay | Admitting: Hematology

## 2021-11-20 ENCOUNTER — Other Ambulatory Visit (HOSPITAL_COMMUNITY): Payer: Self-pay

## 2021-11-24 ENCOUNTER — Other Ambulatory Visit (HOSPITAL_COMMUNITY): Payer: Self-pay

## 2021-12-28 ENCOUNTER — Encounter: Payer: Self-pay | Admitting: Hematology

## 2021-12-28 ENCOUNTER — Other Ambulatory Visit (HOSPITAL_COMMUNITY): Payer: Self-pay

## 2021-12-28 ENCOUNTER — Encounter: Payer: Self-pay | Admitting: Family Medicine

## 2021-12-30 ENCOUNTER — Encounter (HOSPITAL_COMMUNITY): Payer: Self-pay

## 2021-12-30 ENCOUNTER — Other Ambulatory Visit (HOSPITAL_COMMUNITY): Payer: Self-pay

## 2022-01-14 ENCOUNTER — Encounter: Payer: Self-pay | Admitting: Hematology

## 2022-01-16 ENCOUNTER — Encounter: Payer: Self-pay | Admitting: Hematology

## 2022-02-17 ENCOUNTER — Other Ambulatory Visit (HOSPITAL_COMMUNITY): Payer: Self-pay

## 2022-02-17 ENCOUNTER — Encounter: Payer: Self-pay | Admitting: Hematology

## 2022-03-22 ENCOUNTER — Other Ambulatory Visit (HOSPITAL_COMMUNITY): Payer: Self-pay

## 2022-03-29 ENCOUNTER — Other Ambulatory Visit: Payer: Self-pay

## 2022-04-12 ENCOUNTER — Encounter: Payer: Self-pay | Admitting: Family Medicine

## 2022-04-12 ENCOUNTER — Ambulatory Visit: Payer: Commercial Managed Care - PPO | Admitting: Family Medicine

## 2022-04-12 ENCOUNTER — Encounter: Payer: Self-pay | Admitting: Hematology

## 2022-04-12 VITALS — BP 102/78 | HR 80 | Temp 98.3°F | Ht 62.0 in | Wt 168.2 lb

## 2022-04-12 DIAGNOSIS — I10 Essential (primary) hypertension: Secondary | ICD-10-CM | POA: Diagnosis not present

## 2022-04-12 DIAGNOSIS — E119 Type 2 diabetes mellitus without complications: Secondary | ICD-10-CM

## 2022-04-12 DIAGNOSIS — F325 Major depressive disorder, single episode, in full remission: Secondary | ICD-10-CM | POA: Diagnosis not present

## 2022-04-12 DIAGNOSIS — Z1231 Encounter for screening mammogram for malignant neoplasm of breast: Secondary | ICD-10-CM

## 2022-04-12 DIAGNOSIS — Z789 Other specified health status: Secondary | ICD-10-CM

## 2022-04-12 LAB — POCT GLYCOSYLATED HEMOGLOBIN (HGB A1C): Hemoglobin A1C: 6.3 % — AB (ref 4.0–5.6)

## 2022-04-12 LAB — HM DIABETES EYE EXAM

## 2022-04-12 MED ORDER — FLUOXETINE HCL 40 MG PO CAPS: 40.0000 mg | ORAL_CAPSULE | Freq: Every day | ORAL | 1 refills | Status: DC

## 2022-04-12 MED ORDER — LOSARTAN POTASSIUM 50 MG PO TABS: ORAL_TABLET | Freq: Every day | ORAL | 1 refills | Status: DC

## 2022-04-12 NOTE — Assessment & Plan Note (Signed)
Mood is normal today, she is doing well on the current dose of fluoxetine 40 mg daily. Will continue this as prescribed, rx sent to the pharmacy today

## 2022-04-12 NOTE — Progress Notes (Signed)
Established Patient Office Visit  Subjective   Patient ID: Rachael Hodge, female    DOB: 01/01/1966  Age: 57 y.o. MRN: [id number]  Chief Complaint  Patient presents with   Medical Management of Chronic Issues    Pt is here for 6 month follow up.  DM-- A1C today is 6.3 which is well controlled with diet and exercise. Patient states she has no changes or issues to report. No blurry vision, no neuropathy. Has her eye exam scheduled for today. She denies any other new symptoms or issues to report.   HTN -- BP in office performed and is well controlled. She  reports no side effects to the medications, no chest pain, SOB, dizziness or headaches. She has a BP cuff at home and is checking BP regularly, reports they are in the normal range.         Current Outpatient Medications  Medication Instructions   COVID-19 mRNA bivalent vaccine, Pfizer, (PFIZER COVID-19 VAC BIVALENT) injection Intramuscular   cyanocobalamin 100 mcg, Oral, Daily   FLUoxetine (PROZAC) 40 MG capsule Take 1 capsule (40 mg total) by mouth daily as directed.   influenza vac split quadrivalent PF (FLUARIX QUADRIVALENT) 0.5 ML injection Intramuscular   losartan (COZAAR) 50 MG tablet TAKE 1 TABLET BY MOUTH ONCE DAILY   omeprazole (PRILOSEC) 20 mg, Oral, Daily    Patient Active Problem List   Diagnosis Date Noted   Statin intolerance 04/12/2022   Diabetes mellitus type II, controlled ([place]) 10/16/2018   Gastritis 07/03/2018   Low serum vitamin B12 07/03/2018   Heme positive stool    Iron deficiency anemia 10/08/2016   Hyperlipemia 09/29/2015   Essential hypertension 09/29/2015   Major depressive disorder with single episode, in full remission ([place]) 09/29/2015      Review of Systems  All other systems reviewed and are negative.     Objective:     BP 102/78 (BP Location: Left Arm, Patient Position: Sitting, Cuff Size: Large)   Pulse 80   Temp 98.3 F (36.8 C) (Oral)   Ht '5\' 2"'$  (1.575 m)   Wt 168 lb  3.2 oz (76.3 kg)   LMP 12/02/2012   SpO2 99%   BMI 30.76 kg/m    Physical Exam Vitals reviewed.  Constitutional:      Appearance: Normal appearance. She is well-groomed and normal weight.  Eyes:     Conjunctiva/sclera: Conjunctivae normal.  Neck:     Thyroid: No thyromegaly.  Cardiovascular:     Rate and Rhythm: Normal rate and regular rhythm.     Pulses: Normal pulses.     Heart sounds: S1 normal and S2 normal.  Pulmonary:     Effort: Pulmonary effort is normal.     Breath sounds: Normal breath sounds and air entry.  Abdominal:     General: Bowel sounds are normal.  Musculoskeletal:     Right lower leg: No edema.     Left lower leg: No edema.  Neurological:     Mental Status: She is alert and oriented to person, place, and time. Mental status is at baseline.     Gait: Gait is intact.  Psychiatric:        Mood and Affect: Mood and affect normal.        Speech: Speech normal.        Behavior: Behavior normal.        Judgment: Judgment normal.    Diabetic Foot Exam - Simple   Simple Foot Form Diabetic Foot  exam was performed with the following findings: Yes 04/12/2022  8:19 AM  Visual Inspection No deformities, no ulcerations, no other skin breakdown bilaterally: Yes Sensation Testing Intact to touch and monofilament testing bilaterally: Yes Pulse Check Posterior Tibialis and Dorsalis pulse intact bilaterally: Yes Comments       Results for orders placed or performed in visit on 04/12/22  POC HgB A1c  Result Value Ref Range   Hemoglobin A1C 6.3 (A) 4.0 - 5.6 %   HbA1c POC (<> result, manual entry)     HbA1c, POC (prediabetic range)     HbA1c, POC (controlled diabetic range)        The 10-year ASCVD risk score (Arnett DK, et al., 2019) is: 4.7%    Assessment & Plan:   Problem List Items Addressed This Visit       Unprioritized   Essential hypertension    Current hypertension medications:       Sig   losartan (COZAAR) 50 MG tablet TAKE 1 TABLET BY  MOUTH ONCE DAILY     BP well controlled today, will continue the above medication, refills sent to the pharmacy today      Relevant Medications   losartan (COZAAR) 50 MG tablet   Major depressive disorder with single episode, in full remission ([place])    Mood is normal today, she is doing well on the current dose of fluoxetine 40 mg daily. Will continue this as prescribed, rx sent to the pharmacy today      Relevant Medications   FLUoxetine (PROZAC) 40 MG capsule   Diabetes mellitus type II, controlled ([place]) - Primary    A1C is 6.3, currently diet controlled, foot exam completed today. Eye exam is scheduled for today, will continue to monitor every 6 months. Pt is intolerant to statins.       Relevant Medications   losartan (COZAAR) 50 MG tablet   Other Relevant Orders   POC HgB A1c (Completed)   Statin intolerance (Chronic)   Other Visit Diagnoses     Breast cancer screening by mammogram       Relevant Orders   MM Digital Screening       Return in about 6 months (around 10/13/2022) for DM.    Farrel Conners, MD

## 2022-04-12 NOTE — Assessment & Plan Note (Signed)
Current hypertension medications:       Sig   losartan (COZAAR) 50 MG tablet TAKE 1 TABLET BY MOUTH ONCE DAILY      BP well controlled today, will continue the above medication, refills sent to the pharmacy today

## 2022-04-12 NOTE — Assessment & Plan Note (Signed)
A1C is 6.3, currently diet controlled, foot exam completed today. Eye exam is scheduled for today, will continue to monitor every 6 months. Pt is intolerant to statins.

## 2022-06-01 ENCOUNTER — Ambulatory Visit
Admission: RE | Admit: 2022-06-01 | Discharge: 2022-06-01 | Disposition: A | Discharge status: Home / Self Care | Payer: Commercial Managed Care - PPO | Source: Ambulatory Visit | Attending: Family Medicine | Admitting: Family Medicine

## 2022-06-01 DIAGNOSIS — Z1231 Encounter for screening mammogram for malignant neoplasm of breast: Secondary | ICD-10-CM

## 2022-06-07 ENCOUNTER — Encounter: Payer: Self-pay | Admitting: Family Medicine

## 2022-06-07 ENCOUNTER — Other Ambulatory Visit: Payer: Self-pay

## 2022-06-07 DIAGNOSIS — F325 Major depressive disorder, single episode, in full remission: Secondary | ICD-10-CM

## 2022-06-07 MED ORDER — FLUOXETINE HCL 40 MG PO CAPS
40.0000 mg | ORAL_CAPSULE | Freq: Every day | ORAL | 0 refills | Status: DC
  Filled 2022-06-07: qty 30, 30d supply, fill #0

## 2022-08-05 ENCOUNTER — Encounter: Payer: Self-pay | Admitting: Family Medicine

## 2022-08-09 ENCOUNTER — Encounter: Payer: Self-pay | Admitting: Family Medicine

## 2022-08-12 ENCOUNTER — Encounter: Payer: Self-pay | Admitting: Family Medicine

## 2022-08-12 ENCOUNTER — Ambulatory Visit: Payer: Commercial Managed Care - PPO | Admitting: Family Medicine

## 2022-08-12 VITALS — BP 140/92 | HR 85 | Temp 98.4°F | Ht 62.0 in | Wt 165.0 lb

## 2022-08-12 DIAGNOSIS — R300 Dysuria: Secondary | ICD-10-CM | POA: Diagnosis not present

## 2022-08-12 DIAGNOSIS — N3000 Acute cystitis without hematuria: Secondary | ICD-10-CM

## 2022-08-12 LAB — POC URINALSYSI DIPSTICK (AUTOMATED)
Bilirubin, UA: NEGATIVE
Blood, UA: NEGATIVE
Glucose, UA: NEGATIVE
Ketones, UA: NEGATIVE
Leukocytes, UA: NEGATIVE
Nitrite, UA: NEGATIVE
Protein, UA: NEGATIVE
Spec Grav, UA: <=1.005 — AB (ref 1.010–1.025)
Urobilinogen, UA: 0.2 E.U./dL
pH, UA: 7.5 (ref 5.0–8.0)

## 2022-08-12 MED ORDER — FLUCONAZOLE 150 MG PO TABS: 150.0000 mg | ORAL_TABLET | ORAL | 0 refills | Status: DC

## 2022-08-12 MED ORDER — CIPROFLOXACIN HCL 250 MG PO TABS: 250.0000 mg | ORAL_TABLET | Freq: Two times a day (BID) | ORAL | 0 refills | Status: AC

## 2022-08-12 NOTE — Progress Notes (Signed)
Acute Office Visit  Subjective:     Patient ID: Rachael Hodge, female    DOB: 09/17/1965, 57 y.o.   MRN: [id number]  Chief Complaint  Patient presents with   Dysuria    Patient complains of pain and urinary frequency x8 days, chills also, used AZO with some relief and cranberry juice, seen at an urgent care in [place] 3 days ago, given Nitrofurantoin  discontinued after 2 days due to headache, diarrhea, leg cramps, tingling sensation bilateral hands, also states she was told the urine culture was negative and advised to discontinue    Dysuria    Patient is in today for urinary burning, increased frequency for about 8 days. She was on vacation last week and started feeling the symptoms, took Azo and cranberry juice, states her sx calmed down a bit, then got worse again, then she went to an urgent care in [place], was told she had a UTI, took the macrobid which helped burning but she had side effects from the medication. States that the urine culture came back and was normal, no growth of bacteria. No fever/chills, no pelvic pain, no flank pain.   Review of Systems  Genitourinary:  Positive for dysuria.  All other systems reviewed and are negative.       Objective:    BP (!) 140/92 (BP Location: Left Arm, Patient Position: Sitting, Cuff Size: Large)   Pulse 85   Temp 98.4 F (36.9 C) (Oral)   Ht 5\' 2"  (1.575 m)   Wt 165 lb (74.8 kg)   LMP 12/02/2012   SpO2 98%   BMI 30.18 kg/m    Physical Exam Constitutional:      Appearance: Normal appearance. She is normal weight.  Eyes:     Conjunctiva/sclera: Conjunctivae normal.  Pulmonary:     Effort: Pulmonary effort is normal.  Abdominal:     Palpations: Abdomen is soft.  Neurological:     Mental Status: She is alert and oriented to person, place, and time.     Results for orders placed or performed in visit on 08/12/22  POCT Urinalysis Dipstick (Automated)  Result Value Ref Range   Color, UA yellow    Clarity,  UA clear    Glucose, UA Negative Negative   Bilirubin, UA negative    Ketones, UA negative    Spec Grav, UA <=1.005 (A) 1.010 - 1.025   Blood, UA negative    pH, UA 7.5 5.0 - 8.0   Protein, UA Negative Negative   Urobilinogen, UA 0.2 0.2 or 1.0 E.U./dL   Nitrite, UA negative    Leukocytes, UA Negative Negative        Assessment & Plan:   Problem List Items Addressed This Visit   None Visit Diagnoses     Dysuria    -  Primary   Relevant Medications   fluconazole (DIFLUCAN) 150 MG tablet   Other Relevant Orders   POCT Urinalysis Dipstick (Automated) (Completed)   Acute cystitis without hematuria       Relevant Medications   ciprofloxacin (CIPRO) 250 MG tablet      Urine is negative, she already had 2-3 days of macrobid so likely the urine culture will also be negative. We discussed it and it would be best to switch her antibiotic and finish treating the uti she was originally diagnosed with, plus adding diflucan 150 mg every 3 day x 2 doses to see if this will help, she will take probiotics to help as well.  Meds ordered this encounter  Medications   fluconazole (DIFLUCAN) 150 MG tablet    Sig: Take 1 tablet (150 mg total) by mouth every 3 (three) days.    Dispense:  2 tablet    Refill:  0   ciprofloxacin (CIPRO) 250 MG tablet    Sig: Take 1 tablet (250 mg total) by mouth 2 (two) times daily for 3 days.    Dispense:  6 tablet    Refill:  0    No follow-ups on file.  Karie Georges, MD

## 2022-08-12 NOTE — Patient Instructions (Signed)
A and D ointment  Probiotics for the diarrhea.

## 2022-08-27 ENCOUNTER — Other Ambulatory Visit: Payer: Self-pay | Admitting: Family Medicine

## 2022-08-27 DIAGNOSIS — I10 Essential (primary) hypertension: Secondary | ICD-10-CM

## 2022-09-08 ENCOUNTER — Other Ambulatory Visit: Payer: Self-pay | Admitting: Family Medicine

## 2022-09-08 DIAGNOSIS — F325 Major depressive disorder, single episode, in full remission: Secondary | ICD-10-CM

## 2022-09-16 ENCOUNTER — Encounter (INDEPENDENT_AMBULATORY_CARE_PROVIDER_SITE_OTHER): Payer: Self-pay

## 2022-10-13 ENCOUNTER — Ambulatory Visit: Payer: Commercial Managed Care - PPO | Admitting: Family Medicine

## 2022-10-13 ENCOUNTER — Encounter: Payer: Self-pay | Admitting: Family Medicine

## 2022-10-13 VITALS — BP 118/80 | HR 75 | Temp 98.2°F | Ht 62.0 in | Wt 168.2 lb

## 2022-10-13 DIAGNOSIS — E119 Type 2 diabetes mellitus without complications: Secondary | ICD-10-CM

## 2022-10-13 DIAGNOSIS — I10 Essential (primary) hypertension: Secondary | ICD-10-CM | POA: Diagnosis not present

## 2022-10-13 DIAGNOSIS — F325 Major depressive disorder, single episode, in full remission: Secondary | ICD-10-CM

## 2022-10-13 LAB — COMPREHENSIVE METABOLIC PANEL
ALT: 35 U/L (ref 0–35)
AST: 24 U/L (ref 0–37)
Albumin: 4.3 g/dL (ref 3.5–5.2)
Alkaline Phosphatase: 96 U/L (ref 39–117)
BUN: 14 mg/dL (ref 6–23)
CO2: 29 meq/L (ref 19–32)
Calcium: 9.3 mg/dL (ref 8.4–10.5)
Chloride: 103 meq/L (ref 96–112)
Creatinine, Ser: 0.93 mg/dL (ref 0.40–1.20)
GFR: 67.19 mL/min (ref >60.00)
Glucose, Bld: 98 mg/dL (ref 70–99)
Potassium: 4.8 meq/L (ref 3.5–5.1)
Sodium: 139 meq/L (ref 135–145)
Total Bilirubin: 0.4 mg/dL (ref 0.2–1.2)
Total Protein: 7.2 g/dL (ref 6.0–8.3)

## 2022-10-13 LAB — MICROALBUMIN / CREATININE URINE RATIO
Creatinine,U: 134.1 mg/dL
Microalb Creat Ratio: 0.5 mg/g (ref 0.0–30.0)
Microalb, Ur: <0.7 mg/dL (ref 0.0–1.9)

## 2022-10-13 LAB — POCT GLYCOSYLATED HEMOGLOBIN (HGB A1C): Hemoglobin A1C: 6.2 % — AB (ref 4.0–5.6)

## 2022-10-13 LAB — LIPID PANEL
Cholesterol: 287 mg/dL — ABNORMAL HIGH (ref 0–200)
HDL: 58 mg/dL (ref >39.00)
LDL Cholesterol: 197 mg/dL — ABNORMAL HIGH (ref 0–99)
NonHDL: 229.26
Total CHOL/HDL Ratio: 5
Triglycerides: 161 mg/dL — ABNORMAL HIGH (ref 0.0–149.0)
VLDL: 32.2 mg/dL (ref 0.0–40.0)

## 2022-10-13 MED ORDER — FLUOXETINE HCL 40 MG PO CAPS
40.0000 mg | ORAL_CAPSULE | Freq: Every day | ORAL | 1 refills | Status: DC
Start: 2022-10-13 — End: 2023-02-14

## 2022-10-13 MED ORDER — LOSARTAN POTASSIUM 50 MG PO TABS
50.0000 mg | ORAL_TABLET | Freq: Every day | ORAL | 1 refills | Status: DC
Start: 2022-10-13 — End: 2023-04-06

## 2022-10-13 NOTE — Assessment & Plan Note (Signed)
Current hypertension medications:       Sig   losartan (COZAAR) 50 MG tablet Take 1 tablet (50 mg total) by mouth daily.      Chronic, stable. BP well controlled today, will continue the above medication, refills sent to the pharmacy today

## 2022-10-13 NOTE — Assessment & Plan Note (Signed)
Chronic, stable, sx well controlled on the current dose of fluoxetine, will continue this as prescribed.

## 2022-10-13 NOTE — Patient Instructions (Addendum)
Omega -3 -- at least 2-4 grams per day.  Red Yeast Rice to help lower cholesterol.

## 2022-10-13 NOTE — Progress Notes (Signed)
Established Patient Office Visit  Subjective   Patient ID: Rachael Hodge, female    DOB: Dec 31, 1965  Age: 57 y.o. MRN: [id number]  Chief Complaint  Patient presents with   Medical Management of Chronic Issues    Pt is here for 6 month follow up on her DM and HTN.  DM-- pt is not taking ay medication for this, is diet controlled and has been for several years. She is getting her eye exam today. No changes in overall health, no new symptoms  HTN -- BP in office performed and is well controlled. She  reports no side effects to the medications, no chest pain, SOB, dizziness or headaches. She has a BP cuff at home and is checking BP regularly, reports they are in the normal range.   MDD--chronic, symptoms are reported to be controlled on her fluoxetine, she denies any new side effects or issues with the medications, will refill this today.     Current Outpatient Medications  Medication Instructions   COVID-19 mRNA bivalent vaccine, Pfizer, (PFIZER COVID-19 VAC BIVALENT) injection Intramuscular   cyanocobalamin 100 mcg, Oral, Daily   fluconazole (DIFLUCAN) 150 mg, Oral, Every 3 DAYS   FLUoxetine (PROZAC) 40 mg, Oral, Daily   influenza vac split quadrivalent PF (FLUARIX QUADRIVALENT) 0.5 ML injection Intramuscular   losartan (COZAAR) 50 mg, Oral, Daily   omeprazole (PRILOSEC) 20 mg, Oral, Daily    Patient Active Problem List   Diagnosis Date Noted   Statin intolerance 04/12/2022   Diabetes mellitus type II, controlled (HCC) 10/16/2018   Gastritis 07/03/2018   Low serum vitamin B12 07/03/2018   Heme positive stool    Iron deficiency anemia 10/08/2016   Hyperlipemia 09/29/2015   Essential hypertension 09/29/2015   Major depressive disorder with single episode, in full remission (HCC) 09/29/2015      Review of Systems  All other systems reviewed and are negative.     Objective:     BP 118/80 (BP Location: Left Arm, Patient Position: Sitting, Cuff Size: Large)    Pulse 75   Temp 98.2 F (36.8 C) (Oral)   Ht 5\' 2"  (1.575 m)   Wt 168 lb 3.2 oz (76.3 kg)   LMP 12/02/2012   SpO2 99%   BMI 30.76 kg/m    Physical Exam Vitals reviewed.  Constitutional:      Appearance: Normal appearance. She is well-groomed and normal weight.  Eyes:     Conjunctiva/sclera: Conjunctivae normal.  Neck:     Thyroid: No thyromegaly.  Cardiovascular:     Rate and Rhythm: Normal rate and regular rhythm.     Pulses: Normal pulses.     Heart sounds: S1 normal and S2 normal.  Pulmonary:     Effort: Pulmonary effort is normal.     Breath sounds: Normal breath sounds and air entry.  Musculoskeletal:     Right lower leg: No edema.     Left lower leg: No edema.  Neurological:     Mental Status: She is alert and oriented to person, place, and time. Mental status is at baseline.     Gait: Gait is intact.  Psychiatric:        Mood and Affect: Mood and affect normal.        Speech: Speech normal.        Behavior: Behavior normal.        Judgment: Judgment normal.      Results for orders placed or performed in visit on 10/13/22  POC  HgB A1c  Result Value Ref Range   Hemoglobin A1C 6.2 (A) 4.0 - 5.6 %   HbA1c POC (<> result, manual entry)     HbA1c, POC (prediabetic range)     HbA1c, POC (controlled diabetic range)        The 10-year ASCVD risk score (Arnett DK, et al., 2019) is: 6.2%    Assessment & Plan:  Controlled type 2 diabetes mellitus without complication, without long-term current use of insulin (HCC) Assessment & Plan: Chronic, diet controlled and stable. A1C is 6.2, Eye exam is scheduled for today per the patient's report. She is due for her annual labs, orders placed.  Orders: -     POCT glycosylated hemoglobin (Hb A1C) -     Comprehensive metabolic panel -     Lipid panel -     Microalbumin / creatinine urine ratio  Major depressive disorder with single episode, in full remission [facility]) Assessment & Plan: Chronic, stable, sx well  controlled on the current dose of fluoxetine, will continue this as prescribed.   Orders: -     FLUoxetine HCl; Take 1 capsule (40 mg total) by mouth daily.  Dispense: 90 capsule; Refill: 1  Essential hypertension Assessment & Plan: Current hypertension medications:       Sig   losartan (COZAAR) 50 MG tablet Take 1 tablet (50 mg total) by mouth daily.      Chronic, stable. BP well controlled today, will continue the above medication, refills sent to the pharmacy today  Orders: -     Losartan Potassium; Take 1 tablet (50 mg total) by mouth daily.  Dispense: 90 tablet; Refill: 1     Return in about 6 months (around 04/12/2023) for annual physical exam.    Karie Georges, MD

## 2022-10-13 NOTE — Assessment & Plan Note (Signed)
Chronic, diet controlled and stable. A1C is 6.2, Eye exam is scheduled for today per the patient's report. She is due for her annual labs, orders placed.

## 2022-12-03 ENCOUNTER — Encounter: Payer: Self-pay | Admitting: Hematology

## 2023-01-11 ENCOUNTER — Encounter: Payer: Self-pay | Admitting: Hematology

## 2023-01-18 ENCOUNTER — Encounter: Payer: Self-pay | Admitting: Family Medicine

## 2023-01-18 ENCOUNTER — Ambulatory Visit: Payer: No Typology Code available for payment source | Admitting: Family Medicine

## 2023-01-18 VITALS — BP 118/80 | HR 80 | Temp 98.1°F | Ht 62.0 in | Wt 171.1 lb

## 2023-01-18 DIAGNOSIS — Z7985 Long-term (current) use of injectable non-insulin antidiabetic drugs: Secondary | ICD-10-CM

## 2023-01-18 DIAGNOSIS — E119 Type 2 diabetes mellitus without complications: Secondary | ICD-10-CM

## 2023-01-18 DIAGNOSIS — Z7984 Long term (current) use of oral hypoglycemic drugs: Secondary | ICD-10-CM | POA: Diagnosis not present

## 2023-01-18 LAB — POCT GLYCOSYLATED HEMOGLOBIN (HGB A1C): Hemoglobin A1C: 6.4 % — AB (ref 4.0–5.6)

## 2023-01-18 MED ORDER — TIRZEPATIDE 2.5 MG/0.5ML ~~LOC~~ SOAJ
2.5000 mg | SUBCUTANEOUS | 0 refills | Status: DC
Start: 2023-01-18 — End: 2023-01-19

## 2023-01-18 NOTE — Patient Instructions (Signed)
[email or web address]  [email or web address]  [facility]  [facility]  [facility]?

## 2023-01-18 NOTE — Progress Notes (Unsigned)
   Established Patient Office Visit  Subjective   Patient ID: Rachael Hodge, female    DOB: [birth year]  Age: 57 y.o. MRN: [id number]  Chief Complaint  Patient presents with   patient states she has a number of questions-prior to CPE    Patient is here to discuss a few topics -- patient was concerned about "droopy eye lids" and thinks that it is getting worse.   Obesity-- patient states that she wants to lose weight. Patient had tried metformin in the past but she could not tolerate it-- got severe nausea every time she took the medication.     Current Outpatient Medications  Medication Instructions   COVID-19 mRNA bivalent vaccine, Pfizer, (PFIZER COVID-19 VAC BIVALENT) injection Intramuscular   cyanocobalamin 100 mcg, Oral, Daily   FLUoxetine (PROZAC) 40 mg, Oral, Daily   influenza vac split quadrivalent PF (FLUARIX QUADRIVALENT) 0.5 ML injection Intramuscular   losartan (COZAAR) 50 mg, Oral, Daily   omeprazole (PRILOSEC) 20 mg, Oral, Daily   tirzepatide (MOUNJARO) 2.5 mg, Subcutaneous, Weekly    Patient Active Problem List   Diagnosis Date Noted   Statin intolerance 04/12/2022   Diabetes mellitus type II, controlled (HCC) 10/16/2018   Gastritis 07/03/2018   Low serum vitamin B12 07/03/2018   Heme positive stool    Iron deficiency anemia 10/08/2016   Hyperlipemia 09/29/2015   Essential hypertension 09/29/2015   Major depressive disorder with single episode, in full remission (HCC) 09/29/2015      ROS    Objective:     BP 118/80   Pulse 80   Temp 98.1 F (36.7 C) (Oral)   Ht 5\' 2"  (1.575 m)   Wt 171 lb 1.6 oz (77.6 kg)   LMP 12/02/2012   SpO2 99%   BMI 31.29 kg/m  {Vitals History (Optional):23777}  Physical Exam   Results for orders placed or performed in visit on 01/18/23  POC HgB A1c  Result Value Ref Range   Hemoglobin A1C 6.4 (A) 4.0 - 5.6 %   HbA1c POC (<> result, manual entry)     HbA1c, POC (prediabetic range)     HbA1c, POC (controlled  diabetic range)      {Labs (Optional):23779}  The 10-year ASCVD risk score (Arnett DK, et al., 2019) is: 8.4%    Assessment & Plan:  Controlled type 2 diabetes mellitus without complication, without long-term current use of insulin (HCC) -     POCT glycosylated hemoglobin (Hb A1C) -     Tirzepatide; Inject 2.5 mg into the skin once a week.  Dispense: 6 mL; Refill: 0     No follow-ups on file.    Karie Georges, MD

## 2023-01-19 ENCOUNTER — Telehealth: Payer: Self-pay | Admitting: *Deleted

## 2023-01-19 DIAGNOSIS — E119 Type 2 diabetes mellitus without complications: Secondary | ICD-10-CM

## 2023-01-19 MED ORDER — TIRZEPATIDE 2.5 MG/0.5ML ~~LOC~~ SOAJ: 2.5000 mg | SUBCUTANEOUS | 0 refills | Status: DC

## 2023-01-19 NOTE — Assessment & Plan Note (Addendum)
A1c is trending up, she had adverse reaction to Metformin in the past, cannot tolerate this medication. Will start mounjaro at 2.5 mg weekly and have her follow up in 3 months for repeat A1C check. Max A1C previously was 6.8.

## 2023-01-19 NOTE — Telephone Encounter (Signed)
[facility] faxed a note stating there are market disruptions reported from their wholesaler of [facility] and the Rx should be directed to another pharmacy of the patient's choice.  I spoke with the patient, informed her of this and she is aware the Rx was re-sent to [facility].

## 2023-01-20 ENCOUNTER — Encounter: Payer: Self-pay | Admitting: Family Medicine

## 2023-01-20 DIAGNOSIS — E119 Type 2 diabetes mellitus without complications: Secondary | ICD-10-CM

## 2023-01-20 MED ORDER — SEMAGLUTIDE(0.25 OR 0.5MG/DOS) 2 MG/3ML ~~LOC~~ SOPN
PEN_INJECTOR | SUBCUTANEOUS | 1 refills | Status: DC
Start: 2023-01-20 — End: 2023-01-21

## 2023-01-20 NOTE — Telephone Encounter (Signed)
Ok I changed the rx to ozempic to see if they have that

## 2023-01-21 MED ORDER — TIRZEPATIDE 2.5 MG/0.5ML ~~LOC~~ SOAJ
2.5000 mg | SUBCUTANEOUS | Status: DC
Start: 2023-01-21 — End: 2023-02-18

## 2023-01-25 ENCOUNTER — Telehealth: Payer: Self-pay | Admitting: Pharmacist

## 2023-01-25 ENCOUNTER — Other Ambulatory Visit (HOSPITAL_COMMUNITY): Payer: Self-pay

## 2023-01-25 ENCOUNTER — Encounter: Payer: Self-pay | Admitting: Hematology

## 2023-01-25 NOTE — Telephone Encounter (Signed)
Pharmacy Patient Advocate Encounter   Received notification from Physician's Office that prior authorization for [facility] 2.5MG /0.5ML auto-injectors is required/requested.   Insurance verification completed.   The patient is insured through [facility] .   Per test claim: PA required; PA submitted to above mentioned insurance via CoverMyMeds Key/confirmation #/EOC [facility] Status is pending

## 2023-01-28 ENCOUNTER — Other Ambulatory Visit (HOSPITAL_COMMUNITY): Payer: Self-pay

## 2023-01-28 NOTE — Telephone Encounter (Signed)
Pharmacy Patient Advocate Encounter  Received notification from [facility] that Prior Authorization for Mounjaro 2.5MG /0.5ML auto-injectors has been APPROVED from 01/25/2023 to 01/25/2024. Unable to obtain price due to refill too soon rejection, last fill date 01/28/2023 next available fill date01/14/2025   PA #/Case ID/Reference #: [id number]

## 2023-01-31 NOTE — Telephone Encounter (Signed)
Spoke with Latoya at [facility] and informed her of the approval as below.  She stated the patient picked up the Rx on 12/24.

## 2023-02-03 ENCOUNTER — Ambulatory Visit: Payer: No Typology Code available for payment source | Admitting: Family Medicine

## 2023-02-12 ENCOUNTER — Other Ambulatory Visit: Payer: Self-pay | Admitting: Family Medicine

## 2023-02-12 DIAGNOSIS — F325 Major depressive disorder, single episode, in full remission: Secondary | ICD-10-CM

## 2023-02-14 ENCOUNTER — Encounter: Payer: Self-pay | Admitting: Family Medicine

## 2023-02-17 ENCOUNTER — Encounter: Payer: Self-pay | Admitting: Hematology

## 2023-02-18 ENCOUNTER — Other Ambulatory Visit: Payer: Self-pay | Admitting: Family Medicine

## 2023-02-18 DIAGNOSIS — E119 Type 2 diabetes mellitus without complications: Secondary | ICD-10-CM

## 2023-02-18 MED ORDER — TIRZEPATIDE 5 MG/0.5ML ~~LOC~~ SOAJ: 5.0000 mg | SUBCUTANEOUS | 5 refills | Status: DC

## 2023-02-18 NOTE — Telephone Encounter (Signed)
Rx denial sent via escribe. 

## 2023-02-18 NOTE — Telephone Encounter (Signed)
Sent the 5 mg dose, ok to refuse this rx

## 2023-02-21 ENCOUNTER — Other Ambulatory Visit (HOSPITAL_COMMUNITY): Payer: Self-pay

## 2023-02-21 ENCOUNTER — Encounter: Payer: Self-pay | Admitting: Hematology

## 2023-02-21 ENCOUNTER — Telehealth: Payer: Self-pay

## 2023-02-21 NOTE — Telephone Encounter (Signed)
Pharmacy Patient Advocate Encounter   Received notification from Pt Calls Messages that prior authorization for Mounjaro 5MG /0.5ML auto-injectors is required/requested.   Insurance verification completed.   The patient is insured through [facility] .   Per test claim: PA required; PA submitted to above mentioned insurance via CoverMyMeds Key/confirmation #/EOC [id number] Status is pending

## 2023-02-22 NOTE — Telephone Encounter (Signed)
Previous A1C in 2020 was 6.8-- I'm not sure if they will let us use that one?

## 2023-02-22 NOTE — Telephone Encounter (Signed)
Please see previous encounter notes

## 2023-02-22 NOTE — Telephone Encounter (Signed)
Please ask them to resubmit that using her highest A1C from the past-- it was 6.8 on 10/11/2018

## 2023-02-22 NOTE — Telephone Encounter (Signed)
Pharmacy Patient Advocate Encounter  Received notification from [facility] that Prior Authorization for [facility] 5MG /0.5ML auto-injectors has been DENIED.  See denial reason below. No denial letter attached in CMM. Will attach denial letter to Media tab once received.   PA #/Case ID/Reference #: [id number]      *we did submit that she had type 2 diabetes and her A1C was 6.4 but they still denied it. We can try and do an appeal but the Dr would have to initiate it and let us know if they want to go that route

## 2023-02-23 ENCOUNTER — Other Ambulatory Visit: Payer: Self-pay | Admitting: Family Medicine

## 2023-02-23 DIAGNOSIS — E119 Type 2 diabetes mellitus without complications: Secondary | ICD-10-CM

## 2023-02-23 NOTE — Telephone Encounter (Signed)
Copied from CRM [id number]. Topic: Clinical - Medication Refill >> Feb 23, 2023  8:04 AM Irine Seal wrote: Most Recent Primary Care Visit:  Provider: Karie Georges  Department: [facility]  Visit Type: OFFICE VISIT  Date: 01/18/2023  Medication: tirzepatide Greggory Keen) 5 MG/0.5ML Pen  Has the patient contacted their pharmacy? Yes (Agent: If no, request that the patient contact the pharmacy for the refill. If patient does not wish to contact the pharmacy document the reason why and proceed with request.) (Agent: If yes, when and what did the pharmacy advise?) Pt stated her mail order pharmacy wont fill this, so [facility] stated to contact PCP since prior authorization was just approved  Is this the correct pharmacy for this prescription? Yes If no, delete pharmacy and type the correct one.  This is the patient's preferred pharmacy:    [facility] [address] ([address] [place] - [address] [place] [address] Phone: [phone] Fax: [phone]   Has the prescription been filled recently? No  Is the patient out of the medication? Yes  Has the patient been seen for an appointment in the last year OR does the patient have an upcoming appointment? Yes  Can we respond through MyChart? Yes  Agent: Please be advised that Rx refills may take up to 3 business days. We ask that you follow-up with your pharmacy.

## 2023-02-23 NOTE — Telephone Encounter (Signed)
The insurance company requires an updated A1C that has been drawn in the past 6 months.

## 2023-02-23 NOTE — Telephone Encounter (Signed)
Please see prior Mychart message from the patient stating the Rx was approved.

## 2023-02-24 ENCOUNTER — Other Ambulatory Visit (HOSPITAL_COMMUNITY): Payer: Self-pay

## 2023-02-24 NOTE — Telephone Encounter (Signed)
Pharmacy Patient Advocate Encounter  Received notification from [facility] that Prior Authorization for Mounjaro 5MG /0.5ML auto-injectors  has been APPROVED from 02/24/23 to 02/24/24. Ran test claim, Copay is $25. This test claim was processed through [facility]- copay amounts may vary at other pharmacies due to pharmacy/plan contracts, or as the patient moves through the different stages of their insurance plan.   PA #/Case ID/Reference #: [id number]

## 2023-04-05 ENCOUNTER — Other Ambulatory Visit: Payer: Self-pay | Admitting: Family Medicine

## 2023-04-05 DIAGNOSIS — I10 Essential (primary) hypertension: Secondary | ICD-10-CM

## 2023-04-13 ENCOUNTER — Ambulatory Visit: Payer: Commercial Managed Care - PPO | Admitting: Family Medicine

## 2023-04-13 ENCOUNTER — Encounter: Payer: Self-pay | Admitting: Hematology

## 2023-04-13 ENCOUNTER — Encounter: Payer: Self-pay | Admitting: Family Medicine

## 2023-04-13 VITALS — BP 102/68 | HR 90 | Temp 98.3°F | Ht 63.5 in | Wt 147.5 lb

## 2023-04-13 DIAGNOSIS — E782 Mixed hyperlipidemia: Secondary | ICD-10-CM | POA: Diagnosis not present

## 2023-04-13 DIAGNOSIS — R21 Rash and other nonspecific skin eruption: Secondary | ICD-10-CM

## 2023-04-13 DIAGNOSIS — E119 Type 2 diabetes mellitus without complications: Secondary | ICD-10-CM

## 2023-04-13 DIAGNOSIS — Z0001 Encounter for general adult medical examination with abnormal findings: Secondary | ICD-10-CM

## 2023-04-13 LAB — LIPID PANEL
Cholesterol: 235 mg/dL — ABNORMAL HIGH (ref 0–200)
HDL: 50.1 mg/dL (ref >39.00)
LDL Cholesterol: 163 mg/dL — ABNORMAL HIGH (ref 0–99)
NonHDL: 185.1
Total CHOL/HDL Ratio: 5
Triglycerides: 111 mg/dL (ref 0.0–149.0)
VLDL: 22.2 mg/dL (ref 0.0–40.0)

## 2023-04-13 LAB — HEMOGLOBIN A1C: Hgb A1c MFr Bld: 6.2 % (ref 4.6–6.5)

## 2023-04-13 LAB — HM DIABETES EYE EXAM

## 2023-04-13 LAB — VITAMIN B12: Vitamin B-12: 375 pg/mL (ref 211–911)

## 2023-04-13 MED ORDER — METHYLPREDNISOLONE 4 MG PO TBPK: ORAL_TABLET | ORAL | 0 refills | Status: DC

## 2023-04-13 MED ORDER — TRIAMCINOLONE ACETONIDE 0.1 % EX CREA: 1.0000 | TOPICAL_CREAM | Freq: Two times a day (BID) | CUTANEOUS | 0 refills | Status: DC

## 2023-04-13 NOTE — Progress Notes (Signed)
 Complete physical exam  Patient: Rachael Hodge   DOB: 24-May-1965   58 y.o. Female  MRN: [id number]  Subjective:    Chief Complaint  Patient presents with   Annual Exam   Rash    Patient complains of an itchy, burning rash noted over the entire body x2 weeks, tried Aveeno cream with no relief and Epsom salt baths with temporary relief    Rachael Hodge is a 58 y.o. female who presents today for a complete physical exam. She reports consuming a general diet. Home exercise routine includes elliptical machine 3 days per week. She generally feels well. She reports sleeping well. She does have additional problems to discuss today.   Patient has a new rash today, states that it happens to her every spring when the weather changes, it is very itchy and it burns, has been moisturizing constantly but it doesn't seem to have improved the rash. All over, neck, back, chest, arms and legs, palm and foot sparing.   Most recent fall risk assessment:     No data to display           Most recent depression screenings:    04/13/2023    8:06 AM 01/18/2023    4:12 PM  PHQ 2/9 Scores  PHQ - 2 Score 0 0  PHQ- 9 Score 0 0    Vision:Within last year and has appt today and Dental: No current dental problems and Receives regular dental care  Patient Active Problem List   Diagnosis Date Noted   Statin intolerance 04/12/2022   Diabetes mellitus type II, controlled (HCC) 10/16/2018   Gastritis 07/03/2018   Low serum vitamin B12 07/03/2018   Heme positive stool    Iron deficiency anemia 10/08/2016   Hyperlipemia 09/29/2015   Essential hypertension 09/29/2015   Major depressive disorder with single episode, in full remission (HCC) 09/29/2015      Patient Care Team: Karie Georges, MD as PCP - General (Family Medicine) Romualdo Bolk, MD (Inactive) as Consulting Physician (Obstetrics and Gynecology)   Outpatient Medications Prior to Visit  Medication Sig   COVID-19 mRNA  bivalent vaccine, Pfizer, (PFIZER COVID-19 VAC BIVALENT) injection Inject into the muscle.   FLUoxetine (PROZAC) 40 MG capsule TAKE 1 CAPSULE BY MOUTH DAILY   influenza vac split quadrivalent PF (FLUARIX QUADRIVALENT) 0.5 ML injection Inject into the muscle.   losartan (COZAAR) 50 MG tablet TAKE 1 TABLET BY MOUTH DAILY   omeprazole (PRILOSEC) 20 MG capsule Take 20 mg by mouth daily.   tirzepatide [facility]) 5 MG/0.5ML Pen Inject 5 mg into the skin once a week.   vitamin B-12 100 MCG tablet Take 1 tablet (100 mcg total) by mouth daily.   No facility-administered medications prior to visit.    Review of Systems  HENT:  Negative for hearing loss.   Eyes:  Negative for blurred vision.  Respiratory:  Negative for shortness of breath.   Cardiovascular:  Negative for chest pain.  Gastrointestinal: Negative.   Genitourinary: Negative.   Musculoskeletal:  Negative for back pain.  Neurological:  Negative for headaches.  Psychiatric/Behavioral:  Negative for depression.        Objective:     BP 102/68   Pulse 90   Temp 98.3 F (36.8 C) (Oral)   Ht 5' 3.5" (1.613 m)   Wt 147 lb 8 oz (66.9 kg)   LMP 12/02/2012   SpO2 98%   BMI 25.72 kg/m    Physical Exam Vitals reviewed.  Constitutional:      Appearance: Normal appearance. She is well-groomed and normal weight.  HENT:     Right Ear: Tympanic membrane and ear canal normal.     Left Ear: Tympanic membrane and ear canal normal.     Mouth/Throat:     Mouth: Mucous membranes are moist.     Pharynx: No posterior oropharyngeal erythema.  Eyes:     Conjunctiva/sclera: Conjunctivae normal.  Neck:     Thyroid: No thyromegaly.  Cardiovascular:     Rate and Rhythm: Normal rate and regular rhythm.     Pulses: Normal pulses.     Heart sounds: S1 normal and S2 normal.  Pulmonary:     Effort: Pulmonary effort is normal.     Breath sounds: Normal breath sounds and air entry.  Abdominal:     General: Abdomen is flat. Bowel sounds are  normal.     Palpations: Abdomen is soft.  Musculoskeletal:     Right lower leg: No edema.     Left lower leg: No edema.  Lymphadenopathy:     Cervical: No cervical adenopathy.  Neurological:     Mental Status: She is alert and oriented to person, place, and time. Mental status is at baseline.     Gait: Gait is intact.  Psychiatric:        Mood and Affect: Mood and affect normal.        Speech: Speech normal.        Behavior: Behavior normal.        Judgment: Judgment normal.              Assessment & Plan:    Routine Health Maintenance and Physical Exam  Immunization History  Administered Date(s) Administered   Hepatitis B 02/16/2003, 03/21/2003, 08/27/2003   Influenza,inj,Quad PF,6+ Mos 09/27/2014, 09/29/2015, 11/04/2016, 10/16/2018, 10/12/2021   Influenza-Unspecified 11/06/2012, 10/19/2013, 11/01/2017, 10/03/2019   MMR 05/11/2013   PFIZER(Purple Top)SARS-COV-2 Vaccination 02/15/2019, 03/07/2019, 11/30/2019   Pfizer Covid-19 Vaccine Bivalent Booster 67yrs & up 11/24/2020   Pneumococcal Polysaccharide-23 11/15/2018   Tdap 05/11/2013   Varicella 05/11/2013   Zoster Recombinant(Shingrix) 08/10/2017, 10/13/2017    Health Maintenance  Topic Date Due   Pneumococcal Vaccine 7-22 Years old (2 of 2 - PCV) 11/15/2019   COVID-19 Vaccine (5 - 2024-25 season) 10/03/2022   OPHTHALMOLOGY EXAM  04/12/2023   INFLUENZA VACCINE  05/02/2023 (Originally 09/02/2022)   HIV Screening  05/27/2024 (Originally 02[birth year]02/1978)   DTaP/Tdap/Td (2 - Td or Tdap) 05/12/2023   Diabetic kidney evaluation - eGFR measurement  10/13/2023   Diabetic kidney evaluation - Urine ACR  10/13/2023   HEMOGLOBIN A1C  10/14/2023   FOOT EXAM  04/12/2024   MAMMOGRAM  05/31/2024   Cervical Cancer Screening (HPV/Pap Cotest)  04/28/2025   Colonoscopy  04/30/2027   Hepatitis C Screening  Completed   Zoster Vaccines- Shingrix  Completed   HPV VACCINES  Aged Out   Fecal DNA (Cologuard)  Discontinued    Discussed  health benefits of physical activity, and encouraged her to engage in regular exercise appropriate for her age and condition.  Encounter for general adult medical examination with abnormal findings      - Normal physical except for the skin rash. Counseled patient on low carb diet and exercise, handouts given today. Also counseled patient on healthy skin hygiene and moisturization today.  Controlled type 2 diabetes mellitus without complication, without long-term current use of insulin (HCC) -     Hemoglobin A1c; Future  Mixed hyperlipidemia -  Lipid panel; Future  Skin rash New,. It appears to be some sort of dermatitis -- I do not think it is a drug eruption and it is fairly evenly distributed over most of her body, in various stages of healing (back is nearly cleared up). I believe that this is extreme irritation from severely dry skin, I will treat patient with medrol dose pak and triamcinolone cream. I encouraged her to continue antihistamines  until the rash is improved. If it does not improve with steroids then we may try antifungals.   -     Vitamin B12; Future -     Iron, TIBC and Ferritin Panel; Future -     methylPREDNISolone; Take package as directed.  Dispense: 21 each; Refill: 0 -     Triamcinolone Acetonide; Apply 1 Application topically 2 (two) times daily.  Dispense: 453.6 g; Refill: 0    Return in about 6 months (around 10/14/2023) for DM.     Karie Georges, MD

## 2023-04-13 NOTE — Patient Instructions (Signed)

## 2023-04-14 LAB — IRON,TIBC AND FERRITIN PANEL
%SAT: 25 % (ref 16–45)
Ferritin: 35 ng/mL (ref 16–232)
Iron: 72 ug/dL (ref 45–160)
TIBC: 285 ug/dL (ref 250–450)

## 2023-05-13 ENCOUNTER — Encounter: Payer: Self-pay | Admitting: Family Medicine

## 2023-05-13 DIAGNOSIS — R21 Rash and other nonspecific skin eruption: Secondary | ICD-10-CM

## 2023-05-15 MED ORDER — METHYLPREDNISOLONE 4 MG PO TBPK: ORAL_TABLET | ORAL | 0 refills | Status: DC

## 2023-06-11 ENCOUNTER — Encounter: Payer: Self-pay | Admitting: Family Medicine

## 2023-06-11 DIAGNOSIS — E119 Type 2 diabetes mellitus without complications: Secondary | ICD-10-CM

## 2023-06-13 MED ORDER — SEMAGLUTIDE(0.25 OR 0.5MG/DOS) 2 MG/3ML ~~LOC~~ SOPN: PEN_INJECTOR | SUBCUTANEOUS | 5 refills | Status: DC

## 2023-06-20 ENCOUNTER — Encounter: Payer: Self-pay | Admitting: Hematology

## 2023-06-20 ENCOUNTER — Other Ambulatory Visit (HOSPITAL_COMMUNITY): Payer: Self-pay

## 2023-06-20 ENCOUNTER — Telehealth: Payer: Self-pay | Admitting: Pharmacy Technician

## 2023-06-20 NOTE — Telephone Encounter (Signed)
 PA request has been Started. New Encounter has been or will be created for follow up. For additional info see Pharmacy Prior Auth telephone encounter from 06/20/2023.

## 2023-06-20 NOTE — Telephone Encounter (Signed)
 See prior Mychart message.

## 2023-06-20 NOTE — Telephone Encounter (Signed)
 Pharmacy Patient Advocate Encounter   Received notification from Patient Advice Request messages that prior authorization for Ozempic  (0.25 or 0.5 MG/DOSE) 2MG /3ML pen-injectors is required/requested.   Insurance verification completed.   The patient is insured through [facility] .   Per test claim: PA required; PA submitted to above mentioned insurance via CoverMyMeds Key/confirmation #/EOC [id number] Status is pending

## 2023-06-20 NOTE — Telephone Encounter (Signed)
 Pharmacy Patient Advocate Encounter  Received notification from [employer] that Prior Authorization for Ozempic  (0.25 or 0.5 MG/DOSE) 2MG /3ML pen-injectors has been APPROVED from 06/20/2023 to 06/19/2024.   PA #/Case ID/Reference #: [id number]

## 2023-08-01 ENCOUNTER — Other Ambulatory Visit: Payer: Self-pay | Admitting: Family Medicine

## 2023-08-01 DIAGNOSIS — F325 Major depressive disorder, single episode, in full remission: Secondary | ICD-10-CM

## 2023-08-08 ENCOUNTER — Other Ambulatory Visit: Payer: Self-pay | Admitting: Family Medicine

## 2023-08-08 DIAGNOSIS — E119 Type 2 diabetes mellitus without complications: Secondary | ICD-10-CM

## 2023-09-12 ENCOUNTER — Other Ambulatory Visit: Payer: Self-pay | Admitting: Family Medicine

## 2023-09-12 DIAGNOSIS — I10 Essential (primary) hypertension: Secondary | ICD-10-CM

## 2023-10-07 ENCOUNTER — Other Ambulatory Visit: Payer: Self-pay | Admitting: Medical Genetics

## 2023-10-14 ENCOUNTER — Encounter: Payer: Self-pay | Admitting: Family Medicine

## 2023-10-14 ENCOUNTER — Ambulatory Visit: Payer: Self-pay | Admitting: Family Medicine

## 2023-10-14 VITALS — BP 102/72 | HR 94 | Temp 98.3°F | Ht 63.5 in | Wt 124.7 lb

## 2023-10-14 DIAGNOSIS — E119 Type 2 diabetes mellitus without complications: Secondary | ICD-10-CM

## 2023-10-14 DIAGNOSIS — Z8249 Family history of ischemic heart disease and other diseases of the circulatory system: Secondary | ICD-10-CM | POA: Diagnosis not present

## 2023-10-14 DIAGNOSIS — Z7985 Long-term (current) use of injectable non-insulin antidiabetic drugs: Secondary | ICD-10-CM

## 2023-10-14 DIAGNOSIS — E782 Mixed hyperlipidemia: Secondary | ICD-10-CM | POA: Diagnosis not present

## 2023-10-14 LAB — POCT GLYCOSYLATED HEMOGLOBIN (HGB A1C): Hemoglobin A1C: 5.6 % (ref 4.0–5.6)

## 2023-10-14 LAB — LIPID PANEL
Cholesterol: 238 mg/dL — ABNORMAL HIGH (ref 0–200)
HDL: 66.8 mg/dL (ref >39.00)
LDL Cholesterol: 159 mg/dL — ABNORMAL HIGH (ref 0–99)
NonHDL: 171.4
Total CHOL/HDL Ratio: 4
Triglycerides: 60 mg/dL (ref 0.0–149.0)
VLDL: 12 mg/dL (ref 0.0–40.0)

## 2023-10-14 LAB — COMPREHENSIVE METABOLIC PANEL WITH GFR
ALT: 16 U/L (ref 0–35)
AST: 17 U/L (ref 0–37)
Albumin: 4.4 g/dL (ref 3.5–5.2)
Alkaline Phosphatase: 76 U/L (ref 39–117)
BUN: 17 mg/dL (ref 6–23)
CO2: 28 meq/L (ref 19–32)
Calcium: 9.5 mg/dL (ref 8.4–10.5)
Chloride: 99 meq/L (ref 96–112)
Creatinine, Ser: 0.94 mg/dL (ref 0.40–1.20)
GFR: 65.87 mL/min (ref >60.00)
Glucose, Bld: 91 mg/dL (ref 70–99)
Potassium: 4.2 meq/L (ref 3.5–5.1)
Sodium: 136 meq/L (ref 135–145)
Total Bilirubin: 0.6 mg/dL (ref 0.2–1.2)
Total Protein: 6.9 g/dL (ref 6.0–8.3)

## 2023-10-14 LAB — MICROALBUMIN / CREATININE URINE RATIO
Creatinine,U: 216.1 mg/dL
Microalb Creat Ratio: 6.1 mg/g (ref 0.0–30.0)
Microalb, Ur: 1.3 mg/dL (ref 0.0–1.9)

## 2023-10-14 NOTE — Progress Notes (Signed)
 Established Patient Office Visit  Subjective   Patient ID: Rachael Hodge, female    DOB: 06-15-1965  Age: 58 y.o. MRN: [id number]  Chief Complaint  Patient presents with   Medical Management of Chronic Issues    HPI Discussed the use of AI scribe software for clinical note transcription with the patient, who gave verbal consent to proceed.  History of Present Illness   Rachael Hodge is a 58 year old female with diabetes and hyperlipidemia who presents for routine follow-up.  Her most recent A1c is 5.6%. She is currently taking 0.5 mg of Ozempic  and is satisfied with her current dose. She acknowledges changes in metabolism and lifestyle with age, despite having been more physically active in her youth.  Her blood pressure today was 102/72 mmHg and she takes 50 mg of losartan  daily. No symptoms of dizziness, feeling faint, or rapid heart rate. She regularly monitors her blood pressure and ensures adequate hydration, despite not being a natural water drinker.  She has a history of hyperlipidemia with an LDL of 163 mg/dL noted in March. She is intolerant to statins, which previously caused severe side effects. A CT calcium  score in 2022 showed calcification in her coronary arteries, placing her in the 95th percentile for her age. She has lost over 20 pounds recently, which she believes may impact her cholesterol levels.  Her family history includes significant heart disease, with her father having had a quadruple bypass and persistent high cholesterol despite being thin. She is concerned about her genetic risk for heart disease.       Current Outpatient Medications  Medication Instructions   COVID-19 mRNA bivalent vaccine, Pfizer, (PFIZER COVID-19 VAC BIVALENT) injection Intramuscular   cyanocobalamin  100 mcg, Oral, Daily   FLUoxetine  (PROZAC ) 40 mg, Oral, Daily   influenza vac split quadrivalent PF (FLUARIX  QUADRIVALENT) 0.5 ML injection Intramuscular   losartan  (COZAAR ) 50  mg, Oral, Daily   omeprazole  (PRILOSEC) 20 mg, Daily   OZEMPIC , 0.25 OR 0.5 MG/DOSE, 2 MG/3ML SOPN INJECT 0.25 MG SUBCUTANEOUSLY  WEEKLY FOR 4 WEEKS, THEN 0.5 MG  WEEKLY THEREAFTER   triamcinolone  cream (KENALOG ) 0.1 % 1 Application, Topical, 2 times daily    Patient Active Problem List   Diagnosis Date Noted   Statin intolerance 04/12/2022   Diabetes mellitus type II, controlled (HCC) 10/16/2018   Gastritis 07/03/2018   Low serum vitamin B12 07/03/2018   Heme positive stool    Iron  deficiency anemia 10/08/2016   Hyperlipemia 09/29/2015   Essential hypertension 09/29/2015   Major depressive disorder with single episode, in full remission (HCC) 09/29/2015     Review of Systems  All other systems reviewed and are negative.     Objective:     BP 102/72   Pulse 94   Temp 98.3 F (36.8 C) (Oral)   Ht 5' 3.5 (1.613 m)   Wt 124 lb 11.2 oz (56.6 kg)   LMP 12/02/2012   SpO2 99%   BMI 21.74 kg/m    Physical Exam Vitals reviewed.  Constitutional:      Appearance: Normal appearance. She is well-groomed and normal weight.  Cardiovascular:     Rate and Rhythm: Normal rate and regular rhythm.     Heart sounds: S1 normal and S2 normal.  Pulmonary:     Effort: Pulmonary effort is normal.     Breath sounds: Normal breath sounds and air entry.  Musculoskeletal:     Right lower leg: No edema.     Left  lower leg: No edema.  Neurological:     Mental Status: She is alert and oriented to person, place, and time. Mental status is at baseline.     Gait: Gait is intact.  Psychiatric:        Mood and Affect: Mood and affect normal.        Speech: Speech normal.        Behavior: Behavior normal.        Judgment: Judgment normal.      Results for orders placed or performed in visit on 10/14/23  POC HgB A1c  Result Value Ref Range   Hemoglobin A1C 5.6 4.0 - 5.6 %   HbA1c POC (<> result, manual entry)     HbA1c, POC (prediabetic range)     HbA1c, POC (controlled diabetic range)         The 10-year ASCVD risk score (Arnett DK, et al., 2019) is: 6.5%    Assessment & Plan:  Controlled type 2 diabetes mellitus without complication, without long-term current use of insulin  (HCC) -     POCT glycosylated hemoglobin (Hb A1C) -     Collection capillary blood specimen -     Microalbumin / creatinine urine ratio; Future -     Comprehensive metabolic panel with GFR; Future  Mixed hyperlipidemia -     Lipid panel; Future -     Apolipoprotein A-1; Future  Family history of early CAD -     Apolipoprotein A-1; Future   Assessment and Plan    Type 2 diabetes mellitus Diabetes well-controlled with A1c of 5.6%. Current management with Ozempic  effective. BMI is 21, further weight loss not recommended. - Continue Ozempic  0.5 mg. - Monitor A1c regularly. - Ensure diabetic eye exam is completed and results sent to primary care.  Essential hypertension Hypertension well-managed with blood pressure 102/72 mmHg on losartan  50 mg. Asymptomatic for hypotension. - Continue losartan  50 mg. - Monitor blood pressure regularly at home. - Consider reducing losartan  dose if blood pressure is consistently low and symptomatic.  Hyperlipidemia with statin intolerance Hyperlipidemia with statin intolerance. Previous LDL 163 mg/dL. Adverse reactions to statins. CT calcium  score showed coronary artery calcification. Current management includes weight loss and Ozempic . Discussed potential future use of Repatha. - Order lipid panel to reassess cholesterol levels. - Order ApoA test to evaluate genetic risk for heart disease. - Consider future CT calcium  score in 2027. - Discuss potential use of Repatha if cholesterol levels remain high and statin intolerance persists.  General Health Maintenance General health maintenance includes ensuring up-to-date vaccinations and routine screenings. Declined tetanus vaccine. - Offer flu shot and tetanus vaccine. - Ensure completion of urine test for  protein and kidney function.        Return in about 6 months (around 04/12/2024) for annual physical exam.    Heron CHRISTELLA Sharper, MD

## 2023-10-15 LAB — APOLIPOPROTEIN A-1: Apolipoprotein A-1: 151 mg/dL (ref 116–209)

## 2023-10-18 ENCOUNTER — Ambulatory Visit: Payer: Self-pay | Admitting: Family Medicine

## 2023-10-18 DIAGNOSIS — E782 Mixed hyperlipidemia: Secondary | ICD-10-CM

## 2023-10-18 DIAGNOSIS — Z789 Other specified health status: Secondary | ICD-10-CM

## 2023-10-19 ENCOUNTER — Encounter: Payer: Self-pay | Admitting: Family Medicine

## 2023-11-28 ENCOUNTER — Telehealth: Payer: Self-pay | Admitting: Pharmacy Technician

## 2023-11-28 ENCOUNTER — Encounter (HOSPITAL_BASED_OUTPATIENT_CLINIC_OR_DEPARTMENT_OTHER): Payer: Self-pay | Admitting: Internal Medicine

## 2023-11-28 ENCOUNTER — Other Ambulatory Visit (HOSPITAL_COMMUNITY): Payer: Self-pay

## 2023-11-28 ENCOUNTER — Ambulatory Visit (INDEPENDENT_AMBULATORY_CARE_PROVIDER_SITE_OTHER): Admitting: Internal Medicine

## 2023-11-28 VITALS — BP 144/80 | HR 81 | Ht 63.5 in | Wt 125.8 lb

## 2023-11-28 DIAGNOSIS — E785 Hyperlipidemia, unspecified: Secondary | ICD-10-CM | POA: Diagnosis not present

## 2023-11-28 DIAGNOSIS — Z789 Other specified health status: Secondary | ICD-10-CM

## 2023-11-28 DIAGNOSIS — I700 Atherosclerosis of aorta: Secondary | ICD-10-CM

## 2023-11-28 DIAGNOSIS — I2510 Atherosclerotic heart disease of native coronary artery without angina pectoris: Secondary | ICD-10-CM

## 2023-11-28 MED ORDER — REPATHA SURECLICK 140 MG/ML ~~LOC~~ SOAJ: 140.0000 mg | SUBCUTANEOUS | 3 refills | Status: AC

## 2023-11-28 NOTE — Patient Instructions (Signed)
 Medication Instructions:  Dr. Mona recommends Repatha (PCSK9). This is an injectable cholesterol medication self-administered once every 14 days. This medication will likely need prior approval with your insurance company, which we will work on. If the medication is not approved initially, we may need to do an appeal with your insurance. If approved, we will provide you with copay and cost information. We'll then send the prescription to your pharmacy. We would have you complete another set of fasting labs between 3-4 months to reassess cholesterol.   Repatha is self-injected once every 14 days in subcutaneous or fatty tissue - such as belly or side/outer/upper thigh. It is best stored in the refrigerator but is stable at room temp up to 28 days. Please take the pen-injector out of fridge about 30 minutes - 1 hour prior to injection, to allow it to warm closer to room temperature.   This medication is very effective in lowering LDL and can lower LPa, as Dr. Mona mentioned. It is also generally well tolerated -- most common reaction may be cold-like symptoms such as runny nose, scratchy throat, as this is an antibody therapy. It is generally self-limiting and after a few doses, your body should have normalized to the medication.   Here is a demo video: [email or web address]   If you need a co-pay card for Repatha: lawsponsor.fr  Patient Assistance:    These foundations have funds at various times.   The [employer]: https://www.panfoundation.org/disease-funds/hypercholesterolemia/ -- can sign up for wait list  The [facility] offers assistance to help pay for medication copays.  They will cover copays for all cholesterol lowering meds, including statins, fibrates, omega-3 fish oils like Vascepa, ezetimibe, Repatha, Praluent, Nexletol, Nexlizet.  The cards are usually good for $2,500 or 12 months, whichever comes first. Our fax #  is [phone] (you will need this to apply) Go to healthwellfoundation.org Click on "Apply Now" Answer questions as to whom is applying (patient or representative) Your disease fund will be "hypercholesterolemia - Medicare access" They will ask questions about finances and which medications you are taking for cholesterol When you submit, the approval is usually within minutes.  You will need to print the card information from the site You will need to show this information to your pharmacy, they will bill your Medicare Part D plan first -then bill [employer] --for the copay.   You can also call them at [phone], although the hold times can be quite long.     *If you need a refill on your cardiac medications before your next appointment, please call your pharmacy*  Lab Work: Today: Lp(a) In 3 months: NMR lipoprofile (fasting)  If you have labs (blood work) drawn today and your tests are completely normal, you will receive your results only by: MyChart Message (if you have MyChart) OR A paper copy in the mail If you have any lab test that is abnormal or we need to change your treatment, we will call you to review the results.  Testing/Procedures: none  Follow-Up: As needed

## 2023-11-28 NOTE — Telephone Encounter (Signed)
 Pharmacy Patient Advocate Encounter   Received notification from Physician's Office that prior authorization for Repatha is required/requested.   Insurance verification completed.   The patient is insured through [facility].   Per test claim: The current 11/28/23 day co-pay is, $24.99- one month.  No PA needed at this time. This test claim was processed through [facility]- copay amounts may vary at other pharmacies due to pharmacy/plan contracts, or as the patient moves through the different stages of their insurance plan.

## 2023-11-28 NOTE — Progress Notes (Signed)
 LIPID CLINIC CONSULT NOTE  Chief Complaint:  Manage dyslipidemia  Primary Care Physician: Ozell Heron HERO, MD  Primary Cardiologist:  None  HPI:  Rachael Hodge is a 58 y.o. female who is being seen today for the evaluation of dyslipidemia at the request of Ozell Heron HERO, MD. this is a pleasant 58 year old female kindly referred for evaluation management of dyslipidemia.  She has a history of high cholesterol in the past as well as family history of high cholesterol.  She had seen cardiology back in 2016-12-21 after her father died of complications related to a stroke.  She saw Dr. Jordan and a calcium  score was performed in 2020/12/21 which was elevated at 135, 95th percentile for age and sex matched controls and also showed aortic atherosclerosis.  This shows age advanced coronary artery disease.  She was previously on pravastatin  however did not tolerate that it was switched to higher dose of rosuvastatin .  This caused her to be ill.  Subsequently she switched to natural therapies and has recently been losing weight intentionally.  Despite this her cholesterol remains high.  Recent labs showed total cholesterol 238, HDL 66, triglycerides 60, and LDL 159.  For some reason she had an APO A1 that level was normal however this represents a surface protein on HDL and I suspect that the intention was to get LP(a).  She reports some lightheadedness or presyncopal symptoms occasionally with exertion but no real chest pain or shortness of breath.  PMHx:  Past Medical History:  Diagnosis Date   CIN I (cervical intraepithelial neoplasia I)    Depression    Diet-controlled type 2 diabetes mellitus (HCC)    followed by pcp--- (08-17-2019 per pt only on occasion)   Eczema    GERD (gastroesophageal reflux disease)    GI bleed    History of cervical dysplasia 12/21/2004   s/p LEEP   Hyperlipidemia    Hypertension    followed by pcp   (08-17-2019 per pt never had a stress test)   IDA (iron  deficiency  anemia)    VAIN II (vaginal intraepithelial neoplasia grade II)    Wears glasses     Past Surgical History:  Procedure Laterality Date   COLONOSCOPY WITH ESOPHAGOGASTRODUODENOSCOPY (EGD)  04-29-2017  dr shila   GIVENS CAPSULE STUDY N/A 05/09/2017   Procedure: GIVENS CAPSULE STUDY;  Surgeon: Shila Gustav GAILS, MD;  Location: [facility];  Service: Endoscopy;  Laterality: N/A;   LEEP  2004-12-21   LEEP N/A 08/27/2019   Procedure: LOOP ELECTROSURGICAL EXCISION PROCEDURE (LEEP)/CERVICAL BIOPSY AND REMOVAL/ECC with colposcopy;  Surgeon: Jannis Kate Norris, MD;  Location: [facility] [place];  Service: Gynecology;  Laterality: N/A;  CERVICAL BIOPSY AND REMOVAL/ECC   LESION REMOVAL N/A 08/27/2019   Procedure: LASER ABLATION OF Vaginal dysplasia;  Surgeon: Jertson, Jill Evelyn, MD;  Location: [facility];  Service: Gynecology;  Laterality: N/A;    FAMHx:  Family History  Problem Relation Age of Onset   Early death Mother 34       died in sleep; uncertain cause   Heart disease Father 35       CABG   Hyperlipidemia Father    Hypertension Father    Stroke Father 50   CAD Father 54       CAD, quadruple bypass   Heart attack Maternal Grandmother    Alzheimer's disease Maternal Grandmother    Heart attack Maternal Grandfather    Heart attack Paternal Grandmother  Diabetes Paternal Grandmother    Heart disease Paternal Grandfather 18   Crohn's disease Son    Breast cancer Neg Hx    Colon cancer Neg Hx     SOCHx:   reports that she has never smoked. She has never used smokeless tobacco. She reports current alcohol use of about 2.0 standard drinks of alcohol per week. She reports that she does not use drugs.  ALLERGIES:  Allergies  Allergen Reactions   Penicillins Shortness Of Breath   Sulfa Antibiotics Shortness Of Breath   Metformin  And Related Nausea And Vomiting   Crestor  [Rosuvastatin ]     Nausea, light headed   Feraheme  [Ferumoxytol ] Nausea Only and Other  (See Comments)    Hypotension and low heart rate   Lisinopril     cough    ROS: Pertinent items noted in HPI and remainder of comprehensive ROS otherwise negative.  HOME MEDS: Current Outpatient Medications on File Prior to Visit  Medication Sig Dispense Refill   FLUoxetine  (PROZAC ) 40 MG capsule TAKE 1 CAPSULE BY MOUTH DAILY 90 capsule 1   losartan  (COZAAR ) 50 MG tablet TAKE 1 TABLET BY MOUTH DAILY 90 tablet 0   omeprazole  (PRILOSEC) 20 MG capsule Take 20 mg by mouth daily.     OZEMPIC , 0.25 OR 0.5 MG/DOSE, 2 MG/3ML SOPN INJECT 0.25 MG SUBCUTANEOUSLY  WEEKLY FOR 4 WEEKS, THEN 0.5 MG  WEEKLY THEREAFTER 6 mL 4   No current facility-administered medications on file prior to visit.    LABS/IMAGING: No results found for this or any previous visit (from the past 48 hours). No results found.  LIPID PANEL:    Component Value Date/Time   CHOL 238 (H) 10/14/2023 0918   CHOL 175 12/24/2020 0822   TRIG 60.0 10/14/2023 0918   HDL 66.80 10/14/2023 0918   HDL 74 12/24/2020 0822   CHOLHDL 4 10/14/2023 0918   VLDL 12.0 10/14/2023 0918   LDLCALC 159 (H) 10/14/2023 0918   LDLCALC 87 12/24/2020 0822   LDLCALC 141 (H) 09/28/2019 0742    No results found for: LIPOA   WEIGHTS: Wt Readings from Last 3 Encounters:  11/28/23 125 lb 12.8 oz (57.1 kg)  10/14/23 124 lb 11.2 oz (56.6 kg)  04/13/23 147 lb 8 oz (66.9 kg)    VITALS: BP (!) 144/80   Pulse 81   Ht 5' 3.5 (1.613 m)   Wt 125 lb 12.8 oz (57.1 kg)   LMP 12/02/2012   SpO2 97%   BMI 21.93 kg/m   EXAM: Deferred  EKG: Deferred  ASSESSMENT: Suspected familial hyperlipidemia Family history of cardiovascular disease and high cholesterol CAC score 135, 95th percentile (09/2020) Aortic atherosclerosis Statin intolerant-nausea, lightheadedness  PLAN: 1.   [name] has a suspected familial or genetic hyperlipidemia although her LDL is not above 190 there is family history of high cholesterol and early onset heart disease.   This may indicate a high LP(a).  Will need to assess that since she did not already have this testing.  She has a very age advanced coronary artery calcium  score from 2022.  It does not sound like she is having any overt anginal symptoms at this time.  She needs aggressive lipid-lowering with a target LDL of 70 or lower.  It is unlikely will achieve that without statin therapy and I would advise reaching out for PCSK9 inhibitor.  We discussed the use of that today including potential side effects.  Will plan repeat lipids in about 3 to 4 months on  therapy.  Thanks again for the kind referral.  Vinie KYM Maxcy, MD, [facility], FNLA, FACP  [place]  [facility]  Medical Director of the Advanced Lipid Disorders &  Cardiovascular Risk Reduction Clinic Diplomate of the American Board of Clinical Lipidology Attending Cardiologist  Direct Dial: [phone]  Fax: [phone]  Website:  [email or web address]  Vinie BROCKS Etta Gassett 11/28/2023, 9:05 AM

## 2023-11-29 LAB — LIPOPROTEIN A (LPA): Lipoprotein (a): 28.1 nmol/L (ref <75.0)

## 2023-12-06 ENCOUNTER — Ambulatory Visit: Payer: Self-pay | Admitting: Internal Medicine

## 2023-12-11 ENCOUNTER — Other Ambulatory Visit: Payer: Self-pay | Admitting: Family Medicine

## 2023-12-11 DIAGNOSIS — I10 Essential (primary) hypertension: Secondary | ICD-10-CM

## 2024-01-12 NOTE — Progress Notes (Signed)
 Cardiology Office Note   Date:  01/16/2024   ID:  Rachael Hodge, DOB 13-Dec-1965, MRN [id number]  PCP:  Ozell Heron HERO, MD  Cardiologist:   Absalom Aro, MD   Chief Complaint  Patient presents with   Coronary Artery Disease      History of Present Illness: Rachael Hodge is a 58 y.o. female who is seen  to reestablish care for CAD. Last seen in Nov 2022.  She has a history of DM type 2, HTN, and HLD. She has a family history of CAD with father having CABG at age 80.   We performed coronary calcium  score in 2022 which was 135/95th percentile.   She is statin intolerant. States they made her feel awful. She did lose 50 lbs with help of Ozempic . A1c improved to 5.6% but cholesterol has remained high. She is eating very healthy and exercising regularly. Still working from home for [employer].   She has been seen by Dr Mona for HLD. Repatha  recommended. She has not started yet. Planning to start in Jan to pay the the new deductible.     Past Medical History:  Diagnosis Date   CIN I (cervical intraepithelial neoplasia I)    Depression    Diet-controlled type 2 diabetes mellitus (HCC)    followed by pcp--- (08-17-2019 per pt only on occasion)   Eczema    GERD (gastroesophageal reflux disease)    GI bleed    History of cervical dysplasia 2006   s/p LEEP   Hyperlipidemia    Hypertension    followed by pcp   (08-17-2019 per pt never had a stress test)   IDA (iron  deficiency anemia)    VAIN II (vaginal intraepithelial neoplasia grade II)    Wears glasses     Past Surgical History:  Procedure Laterality Date   COLONOSCOPY WITH ESOPHAGOGASTRODUODENOSCOPY (EGD)  04-29-2017  dr shila   GIVENS CAPSULE STUDY N/A 05/09/2017   Procedure: GIVENS CAPSULE STUDY;  Surgeon: Shila Gustav GAILS, MD;  Location: [facility];  Service: Endoscopy;  Laterality: N/A;   LEEP  2006   LEEP N/A 08/27/2019   Procedure: LOOP ELECTROSURGICAL EXCISION PROCEDURE (LEEP)/CERVICAL BIOPSY AND  REMOVAL/ECC with colposcopy;  Surgeon: Jannis Kate Norris, MD;  Location: [facility] [place];  Service: Gynecology;  Laterality: N/A;  CERVICAL BIOPSY AND REMOVAL/ECC   LESION REMOVAL N/A 08/27/2019   Procedure: LASER ABLATION OF Vaginal dysplasia;  Surgeon: Jertson, Jill Evelyn, MD;  Location: [facility];  Service: Gynecology;  Laterality: N/A;     Current Outpatient Medications  Medication Sig Dispense Refill   aspirin  EC 81 MG tablet Take 1 tablet (81 mg total) by mouth daily. Swallow whole.     FLUoxetine  (PROZAC ) 40 MG capsule TAKE 1 CAPSULE BY MOUTH DAILY 90 capsule 1   losartan  (COZAAR ) 50 MG tablet TAKE 1 TABLET BY MOUTH DAILY 90 tablet 3   omeprazole  (PRILOSEC) 20 MG capsule Take 20 mg by mouth daily.     OZEMPIC , 0.25 OR 0.5 MG/DOSE, 2 MG/3ML SOPN INJECT 0.25 MG SUBCUTANEOUSLY  WEEKLY FOR 4 WEEKS, THEN 0.5 MG  WEEKLY THEREAFTER 6 mL 4   Evolocumab  (REPATHA  SURECLICK) 140 MG/ML SOAJ Inject 140 mg into the skin every 14 (fourteen) days. (Patient not taking: Reported on 01/16/2024) 6 mL 3   No current facility-administered medications for this visit.    Allergies:   Penicillins, Sulfa antibiotics, Metformin  and related, Crestor  [rosuvastatin ], Feraheme  [ferumoxytol ], and Lisinopril    Social  History:  The patient  reports that she has never smoked. She has never used smokeless tobacco. She reports current alcohol use of about 2.0 standard drinks of alcohol per week. She reports that she does not use drugs.   Family History:  The patient's family history includes Alzheimer's disease in her maternal grandmother; CAD (age of onset: 65) in her father; Crohn's disease in her son; Diabetes in her paternal grandmother; Early death (age of onset: 10) in her mother; Heart attack in her maternal grandfather, maternal grandmother, and paternal grandmother; Heart disease (age of onset: 41) in her paternal grandfather; Heart disease (age of onset: 50) in her father;  Hyperlipidemia in her father; Hypertension in her father; Stroke (age of onset: 86) in her father.    ROS:  Please see the history of present illness.   Otherwise, review of systems are positive for none.   All other systems are reviewed and negative.    PHYSICAL EXAM: VS:  BP 138/60   Pulse 76   Ht 5' 2 (1.575 m)   Wt 123 lb 3.2 oz (55.9 kg)   LMP 12/02/2012   SpO2 98%   BMI 22.53 kg/m  , BMI Body mass index is 22.53 kg/m. GEN: Well nourished, well developed, in no acute distress HEENT: normal Neck: no JVD, carotid bruits, or masses Cardiac: RRR; no murmurs, rubs, or gallops,no edema  Respiratory:  clear to auscultation bilaterally, normal work of breathing GI: soft, nontender, nondistended, + BS MS: no deformity or atrophy Skin: warm and dry, no rash Neuro:  Strength and sensation are intact Psych: euthymic mood, full affect   EKG Interpretation Date/Time:  Monday January 16 2024 08:07:52 EST Ventricular Rate:  76 PR Interval:  138 QRS Duration:  80 QT Interval:  374 QTC Calculation: 420 R Axis:   78  Text Interpretation: Normal sinus rhythm Normal ECG When compared with ECG of 27-Aug-2019 08:03, No significant change was found Confirmed by Delicia Berens [phone]) on 01/16/2024 8:09:22 AM    Recent Labs: 10/14/2023: ALT 16; BUN 17; Creatinine, Ser 0.94; Potassium 4.2; Sodium 136   02/29/20: A1c 6.5%  Lipid Panel    Component Value Date/Time   CHOL 238 (H) 10/14/2023 0918   CHOL 175 12/24/2020 0822   TRIG 60.0 10/14/2023 0918   HDL 66.80 10/14/2023 0918   HDL 74 12/24/2020 0822   CHOLHDL 4 10/14/2023 0918   VLDL 12.0 10/14/2023 0918   LDLCALC 159 (H) 10/14/2023 0918   LDLCALC 87 12/24/2020 0822   LDLCALC 141 (H) 09/28/2019 0742      Wt Readings from Last 3 Encounters:  01/16/24 123 lb 3.2 oz (55.9 kg)  11/28/23 125 lb 12.8 oz (57.1 kg)  10/14/23 124 lb 11.2 oz (56.6 kg)      Other studies Reviewed: Additional studies/ records that were reviewed  today include:   ADDENDUM REPORT: 09/25/2020 17:45   CLINICAL DATA:  63F for cardiovascular disease risk stratification   EXAM: Coronary Calcium  Score   TECHNIQUE: A gated, non-contrast computed tomography scan of the heart was performed using 3mm slice thickness. Axial images were analyzed on a dedicated workstation. Calcium  scoring of the coronary arteries was performed using the Agatston method.   FINDINGS: Coronary arteries: Normal origins.   Coronary Calcium  Score:   Left main: 0   Left anterior descending artery: 79.9   Left circumflex artery: 6.73   Right coronary artery: 48.5   Total: 135   Percentile: 95th   Pericardium: Normal.   Ascending  Aorta: Normal caliber. Atherosclerosis in the aortic arch and descending aorta.   Non-cardiac: See separate report from [facility].   IMPRESSION: Coronary calcium  score of 135his was 95thcentile for age-, race-, and sex-matched controls.   Atherosclerosis in the aortic arch and descending aorta.   RECOMMENDATIONS: Coronary artery calcium  (CAC) score is a strong predictor of incident coronary heart disease (CHD) and provides predictive information beyond traditional risk factors. CAC scoring is reasonable to use in the decision to withhold, postpone, or initiate statin therapy in intermediate-risk or selected borderline-risk asymptomatic adults (age 17-75 years and LDL-C >=70 to <190 mg/dL) who do not have diabetes or established atherosclerotic cardiovascular disease (ASCVD).* In intermediate-risk (10-year ASCVD risk >=7.5% to <20%) adults or selected borderline-risk (10-year ASCVD risk >=5% to <7.5%) adults in whom a CAC score is measured for the purpose of making a treatment decision the following recommendations have been made:   If CAC=0, it is reasonable to withhold statin therapy and reassess in 5 to 10 years, as long as higher risk conditions are absent (diabetes mellitus, family history of  premature CHD in first degree relatives (males <55 years; females <65 years), cigarette smoking, or LDL >=190 mg/dL).   If CAC is 1 to 99, it is reasonable to initiate statin therapy for patients >=6 years of age.   If CAC is >=100 or >=75th percentile, it is reasonable to initiate statin therapy at any age.   Cardiology referral should be considered for patients with CAC scores >=400 or >=75th percentile.   *2018 AHA/ACC/AACVPR/AAPA/ABC/ACPM/ADA/AGS/APhA/ASPC/NLA/PCNA Guideline on the Management of Blood Cholesterol: A Report of the American College of Cardiology/American Heart Association Task Force on Clinical Practice Guidelines. J Am Coll Cardiol. 2019;73(24):3168-3209.   Annabella Scarce, MD     Electronically Signed   By: Annabella Scarce M.D.   On: 09/25/2020 17:45   ASSESSMENT AND PLAN:  1. Hypercholesterolemia. Given DM, coronary calcification and other risk factors would ideally like to see LDL < 70. She is statin intolerant.  Will continue lifestyle modificatio. Go ahead and start Repatha .  Repeat lab in 3 months. If still not at goal would consider adding Zetia.  2. DM type 2. Excellent response to Ozempic . A1c 5.6%. 3. HTN- generally well controlled.  4. Family history of CAD 5. Coronary artery calcification. Given lack of symptoms no further evaluation needed at this time. She is to notify us  if she has any chest pain or dyspnea. Given risk I would recommend she take a baby ASA daily.     Disposition:   FU with me in one year  Signed, Matty Vanroekel, MD  01/16/2024 8:18 AM    [facility] [address], [place], [place], [address] Phone [phone], Fax [phone]

## 2024-01-14 ENCOUNTER — Other Ambulatory Visit: Payer: Self-pay | Admitting: Family Medicine

## 2024-01-14 DIAGNOSIS — F325 Major depressive disorder, single episode, in full remission: Secondary | ICD-10-CM

## 2024-01-16 ENCOUNTER — Ambulatory Visit: Admitting: Cardiology

## 2024-01-16 ENCOUNTER — Encounter: Payer: Self-pay | Admitting: Cardiology

## 2024-01-16 VITALS — BP 138/60 | HR 76 | Ht 62.0 in | Wt 123.2 lb

## 2024-01-16 DIAGNOSIS — E782 Mixed hyperlipidemia: Secondary | ICD-10-CM

## 2024-01-16 DIAGNOSIS — I700 Atherosclerosis of aorta: Secondary | ICD-10-CM

## 2024-01-16 DIAGNOSIS — I2510 Atherosclerotic heart disease of native coronary artery without angina pectoris: Secondary | ICD-10-CM

## 2024-01-16 DIAGNOSIS — Z789 Other specified health status: Secondary | ICD-10-CM

## 2024-01-16 DIAGNOSIS — I10 Essential (primary) hypertension: Secondary | ICD-10-CM | POA: Diagnosis not present

## 2024-01-16 DIAGNOSIS — E785 Hyperlipidemia, unspecified: Secondary | ICD-10-CM | POA: Diagnosis not present

## 2024-01-16 MED ORDER — ASPIRIN 81 MG PO TBEC: 81.0000 mg | DELAYED_RELEASE_TABLET | Freq: Every day | ORAL | Status: AC

## 2024-01-16 NOTE — Patient Instructions (Signed)
 Medication Instructions:  Start Aspirin  81 mg daily Start Repatha  first year Continue all other medications *If you need a refill on your cardiac medications before your next appointment, please call your pharmacy*  Lab Work: Have fasting lipid and hepatic panels 3 months after starting Repatha    Testing/Procedures: None ordered  Follow-Up: At [facility], you and your health needs are our priority.  As part of our continuing mission to provide you with exceptional heart care, our providers are all part of one team.  This team includes your primary Cardiologist (physician) and Advanced Practice Providers or APPs (Physician Assistants and Nurse Practitioners) who all work together to provide you with the care you need, when you need it.  Your next appointment:  1 year    Call in August to schedule Dec appointment         Provider:  Dr.Jordan    We recommend signing up for the patient portal called MyChart.  Sign up information is provided on this After Visit Summary.  MyChart is used to connect with patients for Virtual Visits (Telemedicine).  Patients are able to view lab/test results, encounter notes, upcoming appointments, etc.  Non-urgent messages can be sent to your provider as well.   To learn more about what you can do with MyChart, go to [email or web address].

## 2024-04-13 ENCOUNTER — Encounter: Admitting: Family Medicine
